# Patient Record
Sex: Male | Born: 1940
Health system: Southern US, Community
[De-identification: ages and names within clinical notes are randomized; demographics above are authoritative.]

## PROBLEM LIST (undated history)

## (undated) DIAGNOSIS — F05 Delirium due to known physiological condition: Secondary | ICD-10-CM

## (undated) DIAGNOSIS — Z955 Presence of coronary angioplasty implant and graft: Secondary | ICD-10-CM

## (undated) DIAGNOSIS — I770 Arteriovenous fistula, acquired: Secondary | ICD-10-CM

## (undated) DIAGNOSIS — I739 Peripheral vascular disease, unspecified: Secondary | ICD-10-CM

## (undated) DIAGNOSIS — N35919 Unspecified urethral stricture, male, unspecified site: Secondary | ICD-10-CM

## (undated) DIAGNOSIS — Z992 Dependence on renal dialysis: Secondary | ICD-10-CM

## (undated) DIAGNOSIS — I350 Nonrheumatic aortic (valve) stenosis: Secondary | ICD-10-CM

## (undated) DIAGNOSIS — D631 Anemia in chronic kidney disease: Secondary | ICD-10-CM

## (undated) DIAGNOSIS — I440 Atrioventricular block, first degree: Secondary | ICD-10-CM

## (undated) DIAGNOSIS — I4510 Unspecified right bundle-branch block: Secondary | ICD-10-CM

## (undated) DIAGNOSIS — K5190 Ulcerative colitis, unspecified, without complications: Secondary | ICD-10-CM

## (undated) DIAGNOSIS — M109 Gout, unspecified: Secondary | ICD-10-CM

## (undated) DIAGNOSIS — R29898 Other symptoms and signs involving the musculoskeletal system: Secondary | ICD-10-CM

## (undated) DIAGNOSIS — Z8679 Personal history of other diseases of the circulatory system: Secondary | ICD-10-CM

## (undated) DIAGNOSIS — Z8673 Personal history of transient ischemic attack (TIA), and cerebral infarction without residual deficits: Secondary | ICD-10-CM

## (undated) DIAGNOSIS — I480 Paroxysmal atrial fibrillation: Secondary | ICD-10-CM

## (undated) DIAGNOSIS — Z932 Ileostomy status: Secondary | ICD-10-CM

## (undated) DIAGNOSIS — Z973 Presence of spectacles and contact lenses: Secondary | ICD-10-CM

## (undated) DIAGNOSIS — F0150 Vascular dementia without behavioral disturbance: Secondary | ICD-10-CM

## (undated) DIAGNOSIS — Z87440 Personal history of urinary (tract) infections: Secondary | ICD-10-CM

## (undated) DIAGNOSIS — I5022 Chronic systolic (congestive) heart failure: Secondary | ICD-10-CM

## (undated) DIAGNOSIS — I2510 Atherosclerotic heart disease of native coronary artery without angina pectoris: Secondary | ICD-10-CM

## (undated) DIAGNOSIS — Z87898 Personal history of other specified conditions: Secondary | ICD-10-CM

## (undated) DIAGNOSIS — Z972 Presence of dental prosthetic device (complete) (partial): Secondary | ICD-10-CM

## (undated) DIAGNOSIS — I252 Old myocardial infarction: Secondary | ICD-10-CM

## (undated) DIAGNOSIS — N189 Chronic kidney disease, unspecified: Secondary | ICD-10-CM

## (undated) DIAGNOSIS — Z8619 Personal history of other infectious and parasitic diseases: Secondary | ICD-10-CM

## (undated) DIAGNOSIS — N186 End stage renal disease: Secondary | ICD-10-CM

## (undated) HISTORY — PX: TOTAL COLECTOMY: SHX852

## (undated) HISTORY — PX: CATARACT EXTRACTION W/ INTRAOCULAR LENS  IMPLANT, BILATERAL: SHX1307

## (undated) HISTORY — PX: CARDIOVASCULAR STRESS TEST: SHX262

## (undated) HISTORY — PX: CHOLECYSTECTOMY: SHX55

## (undated) HISTORY — PX: AV FISTULA PLACEMENT: SHX1204

## (undated) HISTORY — PX: TONSILLECTOMY: SUR1361

## (undated) HISTORY — PX: CARDIAC CATHETERIZATION: SHX172

## (undated) HISTORY — PX: CORONARY ANGIOPLASTY WITH STENT PLACEMENT: SHX49

---

## 1999-08-10 ENCOUNTER — Encounter: Payer: Self-pay | Admitting: Urology

## 1999-08-10 ENCOUNTER — Encounter: Admission: RE | Admit: 1999-08-10 | Discharge: 1999-08-10 | Payer: Self-pay | Admitting: Urology

## 2013-04-17 HISTORY — PX: CAROTID ENDARTERECTOMY: SUR193

## 2013-06-12 ENCOUNTER — Inpatient Hospital Stay (HOSPITAL_COMMUNITY)
Admission: EM | Admit: 2013-06-12 | Discharge: 2013-06-15 | DRG: 689 | Disposition: A | Discharge status: Skilled Nursing Facility | Payer: Medicare Other | Attending: Internal Medicine | Admitting: Internal Medicine

## 2013-06-12 ENCOUNTER — Inpatient Hospital Stay (HOSPITAL_COMMUNITY): Payer: Medicare Other

## 2013-06-12 ENCOUNTER — Encounter (HOSPITAL_COMMUNITY): Payer: Self-pay | Admitting: Emergency Medicine

## 2013-06-12 ENCOUNTER — Emergency Department (HOSPITAL_COMMUNITY): Payer: Medicare Other

## 2013-06-12 DIAGNOSIS — N189 Chronic kidney disease, unspecified: Secondary | ICD-10-CM | POA: Diagnosis present

## 2013-06-12 DIAGNOSIS — I10 Essential (primary) hypertension: Secondary | ICD-10-CM

## 2013-06-12 DIAGNOSIS — Z888 Allergy status to other drugs, medicaments and biological substances status: Secondary | ICD-10-CM

## 2013-06-12 DIAGNOSIS — G9340 Encephalopathy, unspecified: Secondary | ICD-10-CM | POA: Diagnosis present

## 2013-06-12 DIAGNOSIS — D696 Thrombocytopenia, unspecified: Secondary | ICD-10-CM | POA: Diagnosis present

## 2013-06-12 DIAGNOSIS — D631 Anemia in chronic kidney disease: Secondary | ICD-10-CM | POA: Diagnosis present

## 2013-06-12 DIAGNOSIS — M109 Gout, unspecified: Secondary | ICD-10-CM | POA: Diagnosis present

## 2013-06-12 DIAGNOSIS — Z823 Family history of stroke: Secondary | ICD-10-CM

## 2013-06-12 DIAGNOSIS — N390 Urinary tract infection, site not specified: Principal | ICD-10-CM | POA: Diagnosis present

## 2013-06-12 DIAGNOSIS — Z79899 Other long term (current) drug therapy: Secondary | ICD-10-CM

## 2013-06-12 DIAGNOSIS — I1311 Hypertensive heart and chronic kidney disease without heart failure, with stage 5 chronic kidney disease, or end stage renal disease: Secondary | ICD-10-CM | POA: Diagnosis present

## 2013-06-12 DIAGNOSIS — N186 End stage renal disease: Secondary | ICD-10-CM | POA: Diagnosis present

## 2013-06-12 DIAGNOSIS — R5381 Other malaise: Secondary | ICD-10-CM | POA: Diagnosis present

## 2013-06-12 DIAGNOSIS — D649 Anemia, unspecified: Secondary | ICD-10-CM | POA: Diagnosis present

## 2013-06-12 DIAGNOSIS — E860 Dehydration: Secondary | ICD-10-CM | POA: Diagnosis present

## 2013-06-12 DIAGNOSIS — I129 Hypertensive chronic kidney disease with stage 1 through stage 4 chronic kidney disease, or unspecified chronic kidney disease: Secondary | ICD-10-CM | POA: Diagnosis present

## 2013-06-12 DIAGNOSIS — Z8673 Personal history of transient ischemic attack (TIA), and cerebral infarction without residual deficits: Secondary | ICD-10-CM

## 2013-06-12 HISTORY — DX: Gout, unspecified: M10.9

## 2013-06-12 LAB — COMPREHENSIVE METABOLIC PANEL
ALT: 12 U/L (ref 0–53)
Alkaline Phosphatase: 67 U/L (ref 39–117)
BUN: 59 mg/dL — ABNORMAL HIGH (ref 6–23)
CO2: 20 mEq/L (ref 19–32)
Chloride: 97 mEq/L (ref 96–112)
GFR calc Af Amer: 11 mL/min — ABNORMAL LOW (ref >90)
Glucose, Bld: 132 mg/dL — ABNORMAL HIGH (ref 70–99)
Potassium: 4.4 mEq/L (ref 3.5–5.1)
Sodium: 131 mEq/L — ABNORMAL LOW (ref 135–145)
Total Bilirubin: 0.5 mg/dL (ref 0.3–1.2)
Total Protein: 6.4 g/dL (ref 6.0–8.3)

## 2013-06-12 LAB — CBC WITH DIFFERENTIAL/PLATELET
Eosinophils Absolute: 0.1 10*3/uL (ref 0.0–0.7)
Lymphocytes Relative: 6 % — ABNORMAL LOW (ref 12–46)
Lymphs Abs: 0.6 10*3/uL — ABNORMAL LOW (ref 0.7–4.0)
MCHC: 34.9 g/dL (ref 30.0–36.0)
Monocytes Absolute: 0.8 10*3/uL (ref 0.1–1.0)
Monocytes Relative: 9 % (ref 3–12)
Neutro Abs: 7.2 10*3/uL (ref 1.7–7.7)
Neutrophils Relative %: 84 % — ABNORMAL HIGH (ref 43–77)
Platelets: 148 10*3/uL — ABNORMAL LOW (ref 150–400)
RBC: 2.66 MIL/uL — ABNORMAL LOW (ref 4.22–5.81)
WBC: 8.6 10*3/uL (ref 4.0–10.5)

## 2013-06-12 LAB — URINALYSIS, ROUTINE W REFLEX MICROSCOPIC
Bilirubin Urine: NEGATIVE
Ketones, ur: NEGATIVE mg/dL
Nitrite: NEGATIVE
Specific Gravity, Urine: 1.01 (ref 1.005–1.030)
pH: 5.5 (ref 5.0–8.0)

## 2013-06-12 LAB — URINE MICROSCOPIC-ADD ON

## 2013-06-12 MED ORDER — SODIUM CHLORIDE 0.9 % IJ SOLN
3.0000 mL | Freq: Two times a day (BID) | INTRAMUSCULAR | Status: DC
  Administered 2013-06-13 – 2013-06-15 (×3): 3 mL via INTRAVENOUS

## 2013-06-12 MED ORDER — MORPHINE SULFATE 4 MG/ML IJ SOLN
4.0000 mg | Freq: Once | INTRAMUSCULAR | Status: AC
  Administered 2013-06-12: 4 mg via INTRAVENOUS
  Filled 2013-06-12: qty 1

## 2013-06-12 MED ORDER — METOPROLOL TARTRATE 100 MG PO TABS
100.0000 mg | ORAL_TABLET | Freq: Every morning | ORAL | Status: DC
  Administered 2013-06-13 – 2013-06-15 (×3): 100 mg via ORAL
  Filled 2013-06-12 (×3): qty 1

## 2013-06-12 MED ORDER — OXYCODONE HCL 5 MG PO TABS
5.0000 mg | ORAL_TABLET | ORAL | Status: DC | PRN
  Administered 2013-06-12 – 2013-06-13 (×2): 5 mg via ORAL
  Filled 2013-06-12 (×2): qty 1

## 2013-06-12 MED ORDER — FERROUS FUMARATE 325 (106 FE) MG PO TABS
1.0000 | ORAL_TABLET | Freq: Two times a day (BID) | ORAL | Status: DC
  Administered 2013-06-12 – 2013-06-15 (×6): 106 mg via ORAL
  Filled 2013-06-12 (×7): qty 1

## 2013-06-12 MED ORDER — FUROSEMIDE 80 MG PO TABS
80.0000 mg | ORAL_TABLET | Freq: Every day | ORAL | Status: DC
  Administered 2013-06-13 – 2013-06-15 (×3): 80 mg via ORAL
  Filled 2013-06-12 (×3): qty 1

## 2013-06-12 MED ORDER — ACETAMINOPHEN 325 MG PO TABS: 650.0000 mg | ORAL_TABLET | Freq: Four times a day (QID) | ORAL | Status: DC | PRN

## 2013-06-12 MED ORDER — METHYLPREDNISOLONE SODIUM SUCC 40 MG IJ SOLR
40.0000 mg | Freq: Once | INTRAMUSCULAR | Status: AC
  Administered 2013-06-13: 40 mg via INTRAVENOUS
  Filled 2013-06-12: qty 1

## 2013-06-12 MED ORDER — ACETAMINOPHEN 650 MG RE SUPP: 650.0000 mg | Freq: Four times a day (QID) | RECTAL | Status: DC | PRN

## 2013-06-12 MED ORDER — DEXTROSE 5 % IV SOLN
1.0000 g | Freq: Once | INTRAVENOUS | Status: AC
  Administered 2013-06-12: 1 g via INTRAVENOUS
  Filled 2013-06-12: qty 10

## 2013-06-12 MED ORDER — SODIUM CHLORIDE 0.9 % IV BOLUS (SEPSIS)
1000.0000 mL | Freq: Once | INTRAVENOUS | Status: AC
  Administered 2013-06-12: 1000 mL via INTRAVENOUS

## 2013-06-12 MED ORDER — ONDANSETRON HCL 4 MG/2ML IJ SOLN: 4.0000 mg | Freq: Three times a day (TID) | INTRAMUSCULAR | Status: AC | PRN

## 2013-06-12 MED ORDER — FEBUXOSTAT 40 MG PO TABS
40.0000 mg | ORAL_TABLET | Freq: Two times a day (BID) | ORAL | Status: DC
  Administered 2013-06-12 – 2013-06-15 (×6): 40 mg via ORAL
  Filled 2013-06-12 (×7): qty 1

## 2013-06-12 MED ORDER — ONDANSETRON HCL 4 MG/2ML IJ SOLN: 4.0000 mg | Freq: Four times a day (QID) | INTRAMUSCULAR | Status: DC | PRN

## 2013-06-12 MED ORDER — ONDANSETRON HCL 4 MG PO TABS: 4.0000 mg | ORAL_TABLET | Freq: Four times a day (QID) | ORAL | Status: DC | PRN

## 2013-06-12 MED ORDER — PANTOPRAZOLE SODIUM 40 MG PO TBEC
40.0000 mg | DELAYED_RELEASE_TABLET | Freq: Every day | ORAL | Status: DC
  Administered 2013-06-13 – 2013-06-15 (×3): 40 mg via ORAL
  Filled 2013-06-12 (×3): qty 1

## 2013-06-12 MED ORDER — DEXTROSE 5 % IV SOLN
1.0000 g | Freq: Every day | INTRAVENOUS | Status: DC
  Administered 2013-06-13 – 2013-06-14 (×2): 1 g via INTRAVENOUS
  Filled 2013-06-12 (×3): qty 10

## 2013-06-12 MED ORDER — SODIUM CHLORIDE 0.9 % IJ SOLN
3.0000 mL | Freq: Two times a day (BID) | INTRAMUSCULAR | Status: DC
  Administered 2013-06-12 – 2013-06-15 (×4): 3 mL via INTRAVENOUS

## 2013-06-12 NOTE — H&P (Addendum)
[facility] History and Physical  Jacob Fields ION:[id number] DOB: Aug 06, 1940 DOA: 06/12/2013  Referring physician: ER physician. PCP: No primary provider on file. Jacob Fields is visiting his daughter from another town.  Chief Complaint: Pain in the left lower extremity and abdominal pain.  HPI: Jacob Fields is a 72 y.o. male with history of chronic kidney disease, chronic anemia, hypertension, gout and who has had recent stroke in September of this year followed by right-sided carotid endarterectomy this November is visiting his daughter for Christmas. Over the last few days he has experienced increasing pain and swelling of the left foot typical of his gout exacerbation. And also last few days has been having suprapubic pain. Jacob Fields's daughter states that Jacob Fields also looks confused. He also has been having some nonproductive cough. In the ER Jacob Fields was found to be mildly febrile. Chest x-ray was unremarkable. UA shows features of UTI and on exam Jacob Fields does have swelling of the left foot. Jacob Fields has pain on moving the left foot. Denies any trauma or fall recently. Jacob Fields otherwise denies any chest pain shortness of breath nausea vomiting diarrhea or any focal deficits. Jacob Fields on my exam has slurred speech which Jacob Fields's daughter states is chronic from his previous stroke. He is oriented but at times gets confused. CT abdomen and pelvis was showing nothing acute.  Review of Systems: As presented in the history of presenting illness, rest negative.  Past Medical History  Diagnosis Date  . Renal disorder   . Stroke   . Hypertension   . Gout    Past Surgical History  Procedure Laterality Date  . Tonsillectomy    . Cholecystectomy    . Carotid endarterectomy    . Colon surgery     Social History:  reports that he has never smoked. He does not have any smokeless tobacco history on file. He reports that he does not drink alcohol or use illicit drugs. Where does Jacob Fields live  presently lives with his daughter. Can Jacob Fields participate in ADLs? Yes.  Allergies  Allergen Reactions  . Plavix [Clopidogrel]     sores    Family History:  Family History  Problem Relation Age of Onset  . Stroke Mother   . Stroke Brother   . CAD Brother       Prior to Admission medications   Medication Sig Start Date End Date Taking? Authorizing Provider  febuxostat (ULORIC) 40 MG tablet Take 40 mg by mouth 2 (two) times daily.   Yes Historical Provider, MD  ferrous fumarate (HEMOCYTE - 106 MG FE) 325 (106 FE) MG TABS tablet Take 1 tablet by mouth 2 (two) times daily.   Yes Historical Provider, MD  metoprolol (LOPRESSOR) 100 MG tablet Take 100 mg by mouth every morning.   Yes Historical Provider, MD  potassium phosphate, monobasic, (K-PHOS ORIGINAL) 500 MG tablet Take 500-1,000 mg by mouth 2 (two) times daily with a meal. Takes 2 in the morning and 1 at night   Yes Historical Provider, MD    Physical Exam: Filed Vitals:   06/12/13 1703 06/12/13 1931 06/12/13 2024 06/12/13 2301  BP: 117/54  105/45 106/54  Pulse: 71  77 79  Temp: 99.8 F (37.7 C) 99.9 F (37.7 C) 99.5 F (37.5 C) 99 F (37.2 C)  TempSrc: Oral Oral  Oral  Resp: 18  18 18   SpO2: 99%  100% 99%     General:  Well-developed and nourished.  Eyes: Anicteric no pallor.  ENT: No discharge from ears  eyes nose mouth.  Neck: No mass felt.  Cardiovascular: S1-S2 heard.  Respiratory: No rhonchi or crepitations.  Abdomen: Soft nontender bowel sounds present.  Skin: Mild erythema of the left foot.  Musculoskeletal: Swelling of the left foot with pain on moving.  Psychiatric:  Jacob Fields is oriented to his name.  Neurologic: Follows commands and moves all extremities.  Labs on Admission:  Basic Metabolic Panel:  Recent Labs Lab 06/12/13 1900  NA 131*  K 4.4  CL 97  CO2 20  GLUCOSE 132*  BUN 59*  CREATININE 5.23*  CALCIUM 9.3   Liver Function Tests:  Recent Labs Lab 06/12/13 1900  AST 15   ALT 12  ALKPHOS 67  BILITOT 0.5  PROT 6.4  ALBUMIN 3.1*   No results found for this basename: LIPASE, AMYLASE,  in the last 168 hours No results found for this basename: AMMONIA,  in the last 168 hours CBC:  Recent Labs Lab 06/12/13 1900  WBC 8.6  NEUTROABS 7.2  HGB 8.7*  HCT 24.9*  MCV 93.6  PLT 148*   Cardiac Enzymes: No results found for this basename: CKTOTAL, CKMB, CKMBINDEX, TROPONINI,  in the last 168 hours  BNP (last 3 results) No results found for this basename: PROBNP,  in the last 8760 hours CBG: No results found for this basename: GLUCAP,  in the last 168 hours  Radiological Exams on Admission: Ct Abdomen Pelvis Wo Contrast  06/12/2013   CLINICAL DATA:  Left-sided back pain. Lower back and bilateral groin pain.  EXAM: CT ABDOMEN AND PELVIS WITHOUT CONTRAST  TECHNIQUE: Multidetector CT imaging of the abdomen and pelvis was performed following the standard protocol without intravenous contrast.  COMPARISON:  None.  FINDINGS: There is right renal atrophy and multiple right renal stones. One stone lies in the renal pelvis. There is no hydronephrosis. The right ureter is normal in course and in caliber with no stones. There is an 8 mm low-density mass protruding laterally from the midpole the right kidney most likely a cyst. Smaller presumed cysts are noted arising from the posterior upper pole. Tiny nonobstructing stone is suggested in the upper pole of the left kidney. There are no other left renal stones and no left renal masses. There is no left hydronephrosis. Normal left ureter. The bladder is unremarkable.  Prostate is mildly enlarged.  Minor reticular subsegmental atelectasis at the lung bases. The heart is normal in size.  Liver is unremarkable. Spleen is borderline enlarged measuring 12.3 cm in longest dimension. No splenic mass or focal lesion. Gallbladder not visualized. Mild prominence of the common bile duct is noted with normal distal tapering. There is  pancreatic atrophy but no pancreatic mass or inflammation. There are no adrenal masses.  No pathologically enlarged lymph nodes are seen. There are no abnormal fluid collections.  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 There is no evidence of obstruction. No bowel wall thickening or mesenteric inflammation is seen.  There are degenerative changes of the visualized spine. No osteoblastic or osteolytic lesions.  IMPRESSION: 1. No acute findings. 2. Right renal atrophy with multiple right renal stones, but no ureteral stone or hydronephrosis. Tiny left intrarenal nonobstructing stone. 3. Borderline splenomegaly. 4. Changes from previous colon surgery. 5. Other chronic findings as described.   Electronically Signed   By: Amie Portland M.D.   On: 06/12/2013 20:08   Dg Chest Port 1 View  06/12/2013   CLINICAL DATA:  Weakness, fever  EXAM: PORTABLE CHEST -  1 VIEW  COMPARISON:  None.  FINDINGS: Mild hyperinflation. Normal heart size. Negative for CHF or pneumonia. Healed numerous left rib fractures with deformity. Vascular calcifications noted compatible with atherosclerosis. No effusion or pneumothorax. Left base atelectasis versus scarring.  IMPRESSION: Chronic changes as above.  No acute process.   Electronically Signed   By: Ruel Favors M.D.   On: 06/12/2013 23:21     Assessment/Plan Principal Problem:   Acute encephalopathy Active Problems:   UTI (urinary tract infection)   Gout attack   CKD (chronic kidney disease)   HTN (hypertension)   Anemia   Thrombocytopenia   1. Acute encephalopathy probably from UTI - Jacob Fields has been placed on ceftriaxone. Check urine cultures and blood cultures. Since Jacob Fields also had some nonproductive cough check influenza PCR. CT head is pending. 2. Left foot swelling possibly from gouty attack - I have placed Jacob Fields on one dose of IV Solu-Medrol. Check uric acid levels. Check x-rays of the left foot to rule out trauma. Continue  Uloric. 3. Chronic kidney disease - Jacob Fields states his creatinine is around 8-9. He is on Lasix 80 mg daily. Closely follow intake output and metabolic panel. 4. Chronic anemia - Jacob Fields does not remember his baseline hemoglobin. He is on iron pills. Closely follow CBC. 5. Hypertension - continue present medications. 6. Thrombocytopenia - follow CBC. 7. Recent stroke status post right-sided carotid endarterectomy. 8. History of seizures - has been off Tegretol for many years ago.    Code Status: Full code.  Family Communication: Family at the bedside.  Disposition Plan: Admit to inpatient.    Emary Zalar N. [facility] Pager [phone].  If 7PM-7AM, please contact night-coverage [email or web address] Password [facility] 06/12/2013, 11:26 PM

## 2013-06-12 NOTE — ED Notes (Signed)
Per EMS, Pt. C/o pani in both legs due to gout and and bilateral pain in groin area and lower back. Pt. Can usually ambulate on his own, but now needs assistance walking. Pt Pain worsens when sitting up. A&Ox4. Pt has hx of kidney issues and a stroke in the past year. Pt. Has fistula in L arm.

## 2013-06-12 NOTE — ED Provider Notes (Signed)
CSN: [id number]     Arrival date & time 06/12/13  1650 History   First MD Initiated Contact with Patient 06/12/13 1710     Chief Complaint  Patient presents with  . Leg Pain  . Back Pain   (Consider location/radiation/quality/duration/timing/severity/associated sxs/prior Treatment) Patient is a 72 y.o. male presenting with lower extremity pain, hip pain, back pain, and abdominal pain. The history is provided by the patient. No language interpreter was used.  Foot Pain This is a recurrent problem. The current episode started 2 days ago. The problem occurs constantly. The problem has been gradually worsening. Associated symptoms include abdominal pain. Pertinent negatives include no chest pain, no headaches and no shortness of breath. The symptoms are aggravated by walking and standing. The symptoms are relieved by rest and lying down. Treatments tried: oxycodone. The treatment provided no relief.  Hip Pain This is a recurrent problem. The current episode started 2 days ago. The problem occurs constantly. The problem has been gradually worsening. Associated symptoms include abdominal pain. Pertinent negatives include no chest pain, no headaches and no shortness of breath. The symptoms are aggravated by standing. The symptoms are relieved by lying down.  Back Pain Location:  Lumbar spine Quality:  Aching Radiates to:  Does not radiate Pain severity:  Moderate Onset quality:  Gradual Duration:  2 days Timing:  Constant Progression:  Worsening Chronicity:  Recurrent Relieved by:  Lying down Worsened by:  Standing Ineffective treatments: oxycodone. Associated symptoms: abdominal pain   Associated symptoms: no chest pain, no dysuria, no fever, no headaches, no numbness and no weakness   Abdominal Pain Pain location:  Suprapubic Pain quality: aching and sharp   Pain radiates to:  Does not radiate Pain severity:  Moderate Duration:  2 days Timing:  Intermittent Progression:  Waxing and  waning Chronicity:  New Context: not recent illness   Relieved by:  Nothing Ineffective treatments:  None tried Associated symptoms: fatigue   Associated symptoms: no anorexia, no chest pain, no constipation, no cough, no diarrhea, no dysuria, no fever, no nausea, no shortness of breath and no vomiting     Past Medical History  Diagnosis Date  . Renal disorder   . Stroke   . Hypertension   . Gout    Past Surgical History  Procedure Laterality Date  . Tonsillectomy    . Cholecystectomy    . Carotid endarterectomy    . Colon surgery     Family History  Problem Relation Age of Onset  . Stroke Mother   . Stroke Brother   . CAD Brother    History  Substance Use Topics  . Smoking status: Never Smoker   . Smokeless tobacco: Not on file  . Alcohol Use: No    Review of Systems  Constitutional: Positive for fatigue. Negative for fever, activity change and appetite change.  HENT: Negative for congestion, facial swelling, rhinorrhea and trouble swallowing.   Eyes: Negative for photophobia and pain.  Respiratory: Negative for cough, chest tightness and shortness of breath.   Cardiovascular: Negative for chest pain and leg swelling.  Gastrointestinal: Positive for abdominal pain. Negative for nausea, vomiting, diarrhea, constipation and anorexia.  Endocrine: Negative for polydipsia and polyuria.  Genitourinary: Negative for dysuria, urgency, decreased urine volume and difficulty urinating.  Musculoskeletal: Positive for back pain. Negative for gait problem.  Skin: Negative for color change, rash and wound.  Allergic/Immunologic: Negative for immunocompromised state.  Neurological: Negative for dizziness, facial asymmetry, speech difficulty, weakness, numbness and headaches.  Psychiatric/Behavioral: Negative for confusion, decreased concentration and agitation.    Allergies  Plavix  Home Medications   No current outpatient prescriptions on file. BP 112/67  Pulse 79   Temp(Src) 98.1 F (36.7 C) (Oral)  Resp 18  Ht 5\' 6"  (1.676 m)  Wt 141 lb 1.5 oz (64 kg)  BMI 22.78 kg/m2  SpO2 96% Physical Exam  Constitutional: He is oriented to person, place, and time. He appears well-developed and well-nourished. No distress.  HENT:  Head: Normocephalic and atraumatic.  Mouth/Throat: No oropharyngeal exudate.  Eyes: Pupils are equal, round, and reactive to light.  Neck: Normal range of motion. Neck supple.  Cardiovascular: Normal rate, regular rhythm and normal heart sounds.  Exam reveals no gallop and no friction rub.   No murmur heard. Pulmonary/Chest: Effort normal and breath sounds normal. No respiratory distress. He has no wheezes. He has no rales.  Abdominal: Soft. Bowel sounds are normal. He exhibits no distension and no mass. There is no tenderness. There is no rebound and no guarding.  Musculoskeletal: Normal range of motion. He exhibits no edema.       Right hip: He exhibits tenderness.       Left hip: He exhibits tenderness.       Left foot: He exhibits tenderness, bony tenderness and swelling.       Feet:  Neurological: He is alert and oriented to person, place, and time.  Skin: Skin is warm and dry.  Psychiatric: He has a normal mood and affect.    ED Course  Procedures (including critical care time) Labs Review Labs Reviewed  CBC WITH DIFFERENTIAL - Abnormal; Notable for the following:    RBC 2.66 (*)    Hemoglobin 8.7 (*)    HCT 24.9 (*)    Platelets 148 (*)    Neutrophils Relative % 84 (*)    Lymphocytes Relative 6 (*)    Lymphs Abs 0.6 (*)    All other components within normal limits  COMPREHENSIVE METABOLIC PANEL - Abnormal; Notable for the following:    Sodium 131 (*)    Glucose, Bld 132 (*)    BUN 59 (*)    Creatinine, Ser 5.23 (*)    Albumin 3.1 (*)    GFR calc non Af Amer 10 (*)    GFR calc Af Amer 11 (*)    All other components within normal limits  URINALYSIS, ROUTINE W REFLEX MICROSCOPIC - Abnormal; Notable for the  following:    APPearance CLOUDY (*)    Hgb urine dipstick MODERATE (*)    Protein, ur 100 (*)    Leukocytes, UA LARGE (*)    All other components within normal limits  URINE MICROSCOPIC-ADD ON - Abnormal; Notable for the following:    Squamous Epithelial / LPF FEW (*)    Bacteria, UA MANY (*)    All other components within normal limits  URIC ACID - Abnormal; Notable for the following:    Uric Acid, Serum 9.0 (*)    All other components within normal limits  COMPREHENSIVE METABOLIC PANEL - Abnormal; Notable for the following:    Sodium 134 (*)    Glucose, Bld 178 (*)    BUN 57 (*)    Creatinine, Ser 4.86 (*)    Albumin 2.9 (*)    GFR calc non Af Amer 11 (*)    GFR calc Af Amer 12 (*)    All other components within normal limits  CBC WITH DIFFERENTIAL - Abnormal; Notable for the  following:    RBC 2.71 (*)    Hemoglobin 8.7 (*)    HCT 25.2 (*)    Platelets 122 (*)    Neutrophils Relative % 93 (*)    Lymphocytes Relative 4 (*)    Lymphs Abs 0.3 (*)    Monocytes Relative 2 (*)    All other components within normal limits  URINE CULTURE  CULTURE, BLOOD (ROUTINE X 2)  CULTURE, BLOOD (ROUTINE X 2)  INFLUENZA PANEL BY PCR   Imaging Review Ct Abdomen Pelvis Wo Contrast  06/12/2013   CLINICAL DATA:  Left-sided back pain. Lower back and bilateral groin pain.  EXAM: CT ABDOMEN AND PELVIS WITHOUT CONTRAST  TECHNIQUE: Multidetector CT imaging of the abdomen and pelvis was performed following the standard protocol without intravenous contrast.  COMPARISON:  None.  FINDINGS: There is right renal atrophy and multiple right renal stones. One stone lies in the renal pelvis. There is no hydronephrosis. The right ureter is normal in course and in caliber with no stones. There is an 8 mm low-density mass protruding laterally from the midpole the right kidney most likely a cyst. Smaller presumed cysts are noted arising from the posterior upper pole. Tiny nonobstructing stone is suggested in the  upper pole of the left kidney. There are no other left renal stones and no left renal masses. There is no left hydronephrosis. Normal left ureter. The bladder is unremarkable.  Prostate is mildly enlarged.  Minor reticular subsegmental atelectasis at the lung bases. The heart is normal in size.  Liver is unremarkable. Spleen is borderline enlarged measuring 12.3 cm in longest dimension. No splenic mass or focal lesion. Gallbladder not visualized. Mild prominence of the common bile duct is noted with normal distal tapering. There is pancreatic atrophy but no pancreatic mass or inflammation. There are no adrenal masses.  No pathologically enlarged lymph nodes are seen. There are no abnormal fluid collections.  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 There is no evidence of obstruction. No bowel wall thickening or mesenteric inflammation is seen.  There are degenerative changes of the visualized spine. No osteoblastic or osteolytic lesions.  IMPRESSION: 1. No acute findings. 2. Right renal atrophy with multiple right renal stones, but no ureteral stone or hydronephrosis. Tiny left intrarenal nonobstructing stone. 3. Borderline splenomegaly. 4. Changes from previous colon surgery. 5. Other chronic findings as described.   Electronically Signed   By: Amie Portland M.D.   On: 06/12/2013 20:08   Ct Head Wo Contrast  06/13/2013   CLINICAL DATA:  Altered mental status.  EXAM: CT HEAD WITHOUT CONTRAST  TECHNIQUE: Contiguous axial images were obtained from the base of the skull through the vertex without intravenous contrast.  COMPARISON:  None.  FINDINGS: There is no evidence of acute infarction, mass lesion, or intra- or extra-axial hemorrhage on CT.  Prominence of the ventricles and sulci reflects mild to moderate cortical volume loss. Cerebellar atrophy is noted. Scattered periventricular and subcortical white matter change likely reflects small vessel ischemic microangiopathy. Small  chronic lacunar infarcts are seen in the caudate bilaterally, and at the left thalamus. A small chronic infarct is noted at the high right parietal lobe, with mild associated encephalomalacia.  The brainstem and fourth ventricle are within normal limits. No mass effect or midline shift is seen.  There is no evidence of fracture; visualized osseous structures are unremarkable in appearance. The orbits are within normal limits. The paranasal sinuses and mastoid air cells are well-aerated.  No significant soft tissue abnormalities are seen.  IMPRESSION: 1. No acute intracranial pathology seen on CT. 2. Mild to moderate cortical volume loss and scattered small vessel ischemic microangiopathy. 3. Small chronic infarct at the high right parietal lobe, with mild associated encephalomalacia; small chronic lacunar infarcts within the basal ganglia bilaterally, and at the left thalamus.   Electronically Signed   By: Roanna Raider M.D.   On: 06/13/2013 02:22   Dg Chest Port 1 View  06/12/2013   CLINICAL DATA:  Weakness, fever  EXAM: PORTABLE CHEST - 1 VIEW  COMPARISON:  None.  FINDINGS: Mild hyperinflation. Normal heart size. Negative for CHF or pneumonia. Healed numerous left rib fractures with deformity. Vascular calcifications noted compatible with atherosclerosis. No effusion or pneumothorax. Left base atelectasis versus scarring.  IMPRESSION: Chronic changes as above.  No acute process.   Electronically Signed   By: Ruel Favors M.D.   On: 06/12/2013 23:21   Dg Foot Complete Left  06/13/2013   CLINICAL DATA:  Left mid foot and heel pain with history of gout  EXAM: LEFT FOOT - COMPLETE 3+ VIEW  COMPARISON:  None.  FINDINGS: The bones of the left foot appear adequately mineralized. There is no evidence of an acute fracture nor dislocation. There is no periosteal reaction or lytic or blastic lesion. There are vascular calcifications over the midfoot. No erosions are demonstrated. The bones of the hindfoot appear  intact. There is a moderate-sized plantar calcaneal spur.  IMPRESSION: There is no acute bony abnormality of the left foot. There are vascular calcifications.   Electronically Signed   By: David  [place]   On: 06/13/2013 07:43    EKG Interpretation    Date/Time:    Ventricular Rate:    PR Interval:    QRS Duration:   QT Interval:    QTC Calculation:   R Axis:     Text Interpretation:              MDM   1. UTI (urinary tract infection)   2. Gout flare   3. Acute encephalopathy   4. CKD (chronic kidney disease)   5. Gout attack    Pt is a 72 y.o. male with Pmhx as above who presents with BL hip pain, suprapubic pain, low back pain, L foot pain which he believe is due to a gout flare.  Family state he has been unable to stand or walk past 2 days due to pain, has had low grade temp.  Denies cough, CP, SOB.  He strains to urinate, and has chronically loose stool.  On PE, VSS, pt uncomfortable, but non-toxic.  He has suprapubic ttp, TTP BL hips and severe ttp of L foot w/ sweeting c/w gout.  W/U shows Hb 8.7 (unknown baseline), Cr 5.23 (baselin 8-9), UA concerning for infxn with large leukocytes, hb, TNTC WBCs.  CT stone study done w/o no acute findings. Pt given IVF< IV rocephin.  Given concern for UTI and pt's inability to ambulate, perform ADLs at home, I feel he requires inpt admission.         Shanna Cisco, MD 06/13/13 1251

## 2013-06-13 ENCOUNTER — Encounter (HOSPITAL_COMMUNITY): Payer: Self-pay | Admitting: *Deleted

## 2013-06-13 ENCOUNTER — Inpatient Hospital Stay (HOSPITAL_COMMUNITY): Payer: Medicare Other

## 2013-06-13 DIAGNOSIS — I10 Essential (primary) hypertension: Secondary | ICD-10-CM

## 2013-06-13 LAB — COMPREHENSIVE METABOLIC PANEL
ALT: 12 U/L (ref 0–53)
Albumin: 2.9 g/dL — ABNORMAL LOW (ref 3.5–5.2)
Alkaline Phosphatase: 63 U/L (ref 39–117)
BUN: 57 mg/dL — ABNORMAL HIGH (ref 6–23)
CO2: 20 mEq/L (ref 19–32)
Chloride: 101 mEq/L (ref 96–112)
Creatinine, Ser: 4.86 mg/dL — ABNORMAL HIGH (ref 0.50–1.35)
GFR calc Af Amer: 12 mL/min — ABNORMAL LOW (ref >90)
Glucose, Bld: 178 mg/dL — ABNORMAL HIGH (ref 70–99)
Potassium: 4.2 mEq/L (ref 3.5–5.1)
Total Bilirubin: 0.3 mg/dL (ref 0.3–1.2)
Total Protein: 6.5 g/dL (ref 6.0–8.3)

## 2013-06-13 LAB — CBC WITH DIFFERENTIAL/PLATELET
Basophils Absolute: 0 10*3/uL (ref 0.0–0.1)
Eosinophils Relative: 0 % (ref 0–5)
Lymphocytes Relative: 4 % — ABNORMAL LOW (ref 12–46)
Lymphs Abs: 0.3 10*3/uL — ABNORMAL LOW (ref 0.7–4.0)
MCV: 93 fL (ref 78.0–100.0)
Monocytes Relative: 2 % — ABNORMAL LOW (ref 3–12)
Neutro Abs: 6.2 10*3/uL (ref 1.7–7.7)
Neutrophils Relative %: 93 % — ABNORMAL HIGH (ref 43–77)
Platelets: 122 10*3/uL — ABNORMAL LOW (ref 150–400)
RBC: 2.71 MIL/uL — ABNORMAL LOW (ref 4.22–5.81)
RDW: 14.7 % (ref 11.5–15.5)
WBC: 6.7 10*3/uL (ref 4.0–10.5)

## 2013-06-13 LAB — URIC ACID: Uric Acid, Serum: 9 mg/dL — ABNORMAL HIGH (ref 4.0–7.8)

## 2013-06-13 LAB — INFLUENZA PANEL BY PCR (TYPE A & B)
H1N1 flu by pcr: NOT DETECTED
Influenza B By PCR: NEGATIVE

## 2013-06-13 MED ORDER — PREDNISONE 50 MG PO TABS
50.0000 mg | ORAL_TABLET | Freq: Two times a day (BID) | ORAL | Status: DC
  Administered 2013-06-13 – 2013-06-15 (×4): 50 mg via ORAL
  Filled 2013-06-13 (×6): qty 1

## 2013-06-13 NOTE — Progress Notes (Signed)
[facility] PROGRESS NOTE  Jacob Fields UJW:[id number] DOB: [birth year] DOA: 06/12/2013 PCP: No primary provider on file.  Assessment/Plan: Principal Problem:   Acute encephalopathy: Patient seems quite alert and oriented, as commented by my partner yesterday, no evidence of encephalopathy. Patient may have had some mild confusion from dehydration but that has since resolved. Active Problems:   UTI (urinary tract infection): On IV Rocephin, cultures pending. Reports a fever likely secondary to UTI. Flu PCR is negative    Gout attack: Received one dose of IV steroids yesterday and today started on by mouth steroids. Patient is on Lasix which can sometimes precipitate gout attack. We'll start to ambulate   CKD (chronic kidney disease): Looks to be at baseline   HTN (hypertension): Blood pressure stable   Anemia: Remaining stable   Thrombocytopenia: Monitoring platelets. Chronic.  Code Status: Full code Family Communication: Spoke with patient's daughter at the bedside.  Disposition Plan: Home, likely tomorrow once we get out of bed   Consultants:  None  Procedures:  None  Antibiotics:  IV Rocephin day 2  HPI/Subjective: Patient doing okay. No acute distress. Breathing comfortably. Pain in his foot is minimal  Objective: Filed Vitals:   06/13/13 1300  BP: 99/63  Pulse: 58  Temp: 97.8 F (36.6 C)  Resp: 18    Intake/Output Summary (Last 24 hours) at 06/13/13 1636 Last data filed at 06/13/13 1500  Gross per 24 hour  Intake    363 ml  Output    325 ml  Net     38 ml   Filed Weights   06/12/13 2337 06/13/13 0510  Weight: 64 kg (141 lb 1.5 oz) 64 kg (141 lb 1.5 oz)    Exam:   General:  Alert and oriented x3, no acute distress  Cardiovascular: Regular rate and rhythm, S1-S2  Respiratory: Clear to auscultation bilaterally  Abdomen: Soft, nontender, nondistended, positive bowel sounds  Musculoskeletal: No clubbing or cyanosis, trace edema. Left toes  are nontender, slightly swollen.   Data Reviewed: Basic Metabolic Panel:  Recent Labs Lab 06/12/13 1900 06/13/13 0523  NA 131* 134*  K 4.4 4.2  CL 97 101  CO2 20 20  GLUCOSE 132* 178*  BUN 59* 57*  CREATININE 5.23* 4.86*  CALCIUM 9.3 9.5   Liver Function Tests:  Recent Labs Lab 06/12/13 1900 06/13/13 0523  AST 15 14  ALT 12 12  ALKPHOS 67 63  BILITOT 0.5 0.3  PROT 6.4 6.5  ALBUMIN 3.1* 2.9*   No results found for this basename: LIPASE, AMYLASE,  in the last 168 hours No results found for this basename: AMMONIA,  in the last 168 hours CBC:  Recent Labs Lab 06/12/13 1900 06/13/13 0523  WBC 8.6 6.7  NEUTROABS 7.2 6.2  HGB 8.7* 8.7*  HCT 24.9* 25.2*  MCV 93.6 93.0  PLT 148* 122*   Cardiac Enzymes: No results found for this basename: CKTOTAL, CKMB, CKMBINDEX, TROPONINI,  in the last 168 hours BNP (last 3 results) No results found for this basename: PROBNP,  in the last 8760 hours CBG: No results found for this basename: GLUCAP,  in the last 168 hours  No results found for this or any previous visit (from the past 240 hour(s)).   Studies: Ct Abdomen Pelvis Wo Contrast  06/12/2013   CLINICAL DATA:  Left-sided back pain. Lower back and bilateral groin pain.  EXAM: CT ABDOMEN AND PELVIS WITHOUT CONTRAST  TECHNIQUE: Multidetector CT imaging of the abdomen and pelvis was performed following  the standard protocol without intravenous contrast.  COMPARISON:  None.  FINDINGS: There is right renal atrophy and multiple right renal stones. One stone lies in the renal pelvis. There is no hydronephrosis. The right ureter is normal in course and in caliber with no stones. There is an 8 mm low-density mass protruding laterally from the midpole the right kidney most likely a cyst. Smaller presumed cysts are noted arising from the posterior upper pole. Tiny nonobstructing stone is suggested in the upper pole of the left kidney. There are no other left renal stones and no left renal  masses. There is no left hydronephrosis. Normal left ureter. The bladder is unremarkable.  Prostate is mildly enlarged.  Minor reticular subsegmental atelectasis at the lung bases. The heart is normal in size.  Liver is unremarkable. Spleen is borderline enlarged measuring 12.3 cm in longest dimension. No splenic mass or focal lesion. Gallbladder not visualized. Mild prominence of the common bile duct is noted with normal distal tapering. There is pancreatic atrophy but no pancreatic mass or inflammation. There are no adrenal masses.  No pathologically enlarged lymph nodes are seen. There are no abnormal fluid collections.  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 There is no evidence of obstruction. No bowel wall thickening or mesenteric inflammation is seen.  There are degenerative changes of the visualized spine. No osteoblastic or osteolytic lesions.  IMPRESSION: 1. No acute findings. 2. Right renal atrophy with multiple right renal stones, but no ureteral stone or hydronephrosis. Tiny left intrarenal nonobstructing stone. 3. Borderline splenomegaly. 4. Changes from previous colon surgery. 5. Other chronic findings as described.   Electronically Signed   By: Amie Portland M.D.   On: 06/12/2013 20:08   Ct Head Wo Contrast  06/13/2013   CLINICAL DATA:  Altered mental status.  EXAM: CT HEAD WITHOUT CONTRAST  TECHNIQUE: Contiguous axial images were obtained from the base of the skull through the vertex without intravenous contrast.  COMPARISON:  None.  FINDINGS: There is no evidence of acute infarction, mass lesion, or intra- or extra-axial hemorrhage on CT.  Prominence of the ventricles and sulci reflects mild to moderate cortical volume loss. Cerebellar atrophy is noted. Scattered periventricular and subcortical white matter change likely reflects small vessel ischemic microangiopathy. Small chronic lacunar infarcts are seen in the caudate bilaterally, and at the left thalamus.  A small chronic infarct is noted at the high right parietal lobe, with mild associated encephalomalacia.  The brainstem and fourth ventricle are within normal limits. No mass effect or midline shift is seen.  There is no evidence of fracture; visualized osseous structures are unremarkable in appearance. The orbits are within normal limits. The paranasal sinuses and mastoid air cells are well-aerated. No significant soft tissue abnormalities are seen.  IMPRESSION: 1. No acute intracranial pathology seen on CT. 2. Mild to moderate cortical volume loss and scattered small vessel ischemic microangiopathy. 3. Small chronic infarct at the high right parietal lobe, with mild associated encephalomalacia; small chronic lacunar infarcts within the basal ganglia bilaterally, and at the left thalamus.   Electronically Signed   By: Roanna Raider M.D.   On: 06/13/2013 02:22   Dg Chest Port 1 View  06/12/2013   CLINICAL DATA:  Weakness, fever  EXAM: PORTABLE CHEST - 1 VIEW  COMPARISON:  None.  FINDINGS: Mild hyperinflation. Normal heart size. Negative for CHF or pneumonia. Healed numerous left rib fractures with deformity. Vascular calcifications noted compatible with atherosclerosis. No effusion or  pneumothorax. Left base atelectasis versus scarring.  IMPRESSION: Chronic changes as above.  No acute process.   Electronically Signed   By: Ruel Favors M.D.   On: 06/12/2013 23:21   Dg Foot Complete Left  06/13/2013   CLINICAL DATA:  Left mid foot and heel pain with history of gout  EXAM: LEFT FOOT - COMPLETE 3+ VIEW  COMPARISON:  None.  FINDINGS: The bones of the left foot appear adequately mineralized. There is no evidence of an acute fracture nor dislocation. There is no periosteal reaction or lytic or blastic lesion. There are vascular calcifications over the midfoot. No erosions are demonstrated. The bones of the hindfoot appear intact. There is a moderate-sized plantar calcaneal spur.  IMPRESSION: There is no acute  bony abnormality of the left foot. There are vascular calcifications.   Electronically Signed   By: David  [place]   On: 06/13/2013 07:43    Scheduled Meds: . cefTRIAXone (ROCEPHIN)  IV  1 g Intravenous QHS  . febuxostat  40 mg Oral BID  . ferrous fumarate  1 tablet Oral BID  . furosemide  80 mg Oral Daily  . metoprolol  100 mg Oral q morning - 10a  . pantoprazole  40 mg Oral Daily  . predniSONE  50 mg Oral BID WC  . sodium chloride  3 mL Intravenous Q12H  . sodium chloride  3 mL Intravenous Q12H   Continuous Infusions:   Principal Problem:   Acute encephalopathy Active Problems:   UTI (urinary tract infection)   Gout attack   CKD (chronic kidney disease)   HTN (hypertension)   Anemia   Thrombocytopenia    Time spent: 25 minutes    Hollice Espy  [facility] Pager [phone]. If 7PM-7AM, please contact night-coverage at [email or web address], password [facility] 06/13/2013, 4:36 PM  LOS: 1 day

## 2013-06-14 DIAGNOSIS — R5381 Other malaise: Secondary | ICD-10-CM | POA: Diagnosis present

## 2013-06-14 LAB — BASIC METABOLIC PANEL
CO2: 21 mEq/L (ref 19–32)
Chloride: 100 mEq/L (ref 96–112)
Glucose, Bld: 158 mg/dL — ABNORMAL HIGH (ref 70–99)
Potassium: 4.6 mEq/L (ref 3.5–5.1)
Sodium: 134 mEq/L — ABNORMAL LOW (ref 135–145)

## 2013-06-14 NOTE — Care Management Note (Addendum)
    Page 1 of 1   06/15/2013     2:00:31 PM   CARE MANAGEMENT NOTE 06/15/2013  Patient:  Jacob Fields, Jacob Fields   Account Number:  [id number]  Date Initiated:  06/14/2013  Documentation initiated by:  Lanier Clam  Subjective/Objective Assessment:   72 Y/O M ADMITTED W/UTI.     Action/Plan:   FROM HOME.   Anticipated DC Date:  06/15/2013   Anticipated DC Plan:  SKILLED NURSING FACILITY      DC Planning Services  CM consult      Choice offered to / List presented to:             Status of service:  Completed, signed off Medicare Important Message given?   (If response is "NO", the following Medicare IM given date fields will be blank) Date Medicare IM given:   Date Additional Medicare IM given:    Discharge Disposition:  SKILLED NURSING FACILITY  Per UR Regulation:  Reviewed for med. necessity/level of care/duration of stay  If discussed at Long Length of Stay Meetings, dates discussed:    Comments:  06/15/13 Janea Schwenn RN,BSN NCM [phone] D/C SNF.  06/14/13 Marco Raper RN,BSN NCM [phone] PT-SNF.

## 2013-06-14 NOTE — Evaluation (Signed)
Physical Therapy Evaluation Patient Details Name: Jacob Fields MRN: [id number] DOB: March 13, 1941 Today's Date: 06/14/2013 Time: 7829-5621 PT Time Calculation (min): 26 min  PT Assessment / Plan / Recommendation History of Present Illness  72 y.o. male with history of chronic kidney disease, chronic anemia, hypertension, gout and who has had recent stroke in September of this year followed by right-sided carotid endarterectomy this November is visiting his daughter for Christmas. Over the last few days he has experienced increasing pain and swelling of the left foot typical of his gout exacerbation. And also last few days has been having suprapubic pain. Patient's daughter states that patient also looks confused. He also has been having some nonproductive cough. In the ER patient was found to be mildly febrile. Chest x-ray was unremarkable. UA shows features of UTI and on exam patient does have swelling of the left foot. Patient has pain on moving the left foot. Denies any trauma or fall recently. Patient otherwise denies any chest pain shortness of breath nausea vomiting diarrhea or any focal deficits. Patient on my exam has slurred speech which patient's daughter states is chronic from his previous stroke. He is oriented but at times gets confused. CT abdomen and pelvis was showing nothing acute.  Clinical Impression  Pt admitted with UTI.  Pt currently with functional limitations due to the deficits listed below (see PT Problem List).  Pt will benefit from skilled PT to increase their independence and safety with mobility to allow discharge to the venue listed below.  Pt's daughter reports pt is visiting for holidays and lives alone.  She feels pt is not able to care for himself as his cognition has been declining however aware that pt is capable of making his own decisions.  She also reports hx of falls however pt denies.  Recommend SNF at this time.       PT Assessment  Patient needs continued PT  services    Follow Up Recommendations  SNF    Does the patient have the potential to tolerate intense rehabilitation      Barriers to Discharge        Equipment Recommendations  None recommended by PT    Recommendations for Other Services     Frequency Min 3X/week    Precautions / Restrictions Precautions Precautions: Fall   Pertinent Vitals/Pain C/o pain in L foot not rated however states much better since yesterday, utilized RW for pain control      Mobility  Bed Mobility Bed Mobility: Supine to Sit Supine to Sit: 5: Supervision;HOB elevated Details for Bed Mobility Assistance: increased time Transfers Transfers: Sit to Stand;Stand to Sit Sit to Stand: 4: Min assist;With upper extremity assist;From bed Stand to Sit: 4: Min guard;With upper extremity assist;To chair/3-in-1 Details for Transfer Assistance: verbal cues for safe technique Ambulation/Gait Ambulation/Gait Assistance: 4: Min assist Ambulation Distance (Feet): 240 Feet Assistive device: Rolling walker Ambulation/Gait Assistance Details: min assist for LOB upon initiating gait, pt prefers step to pattern for pain control due to gout flare in L foot Gait Pattern: Step-to pattern Gait velocity: decr    Exercises     PT Diagnosis:    PT Problem List: Decreased strength;Decreased knowledge of precautions;Decreased balance;Decreased mobility;Decreased knowledge of use of DME;Pain PT Treatment Interventions: DME instruction;Gait training;Functional mobility training;Therapeutic activities;Therapeutic exercise;Patient/family education;Balance training     PT Goals(Current goals can be found in the care plan section) Acute Rehab PT Goals PT Goal Formulation: With patient/family Time For Goal Achievement: 06/21/13 Potential to  Achieve Goals: Good  Visit Information  Last PT Received On: 06/14/13 Assistance Needed: +1 History of Present Illness: 72 y.o. male with history of chronic kidney disease, chronic  anemia, hypertension, gout and who has had recent stroke in September of this year followed by right-sided carotid endarterectomy this November is visiting his daughter for Christmas. Over the last few days he has experienced increasing pain and swelling of the left foot typical of his gout exacerbation. And also last few days has been having suprapubic pain. Patient's daughter states that patient also looks confused. He also has been having some nonproductive cough. In the ER patient was found to be mildly febrile. Chest x-ray was unremarkable. UA shows features of UTI and on exam patient does have swelling of the left foot. Patient has pain on moving the left foot. Denies any trauma or fall recently. Patient otherwise denies any chest pain shortness of breath nausea vomiting diarrhea or any focal deficits. Patient on my exam has slurred speech which patient's daughter states is chronic from his previous stroke. He is oriented but at times gets confused. CT abdomen and pelvis was showing nothing acute.       Prior Functioning  Home Living Family/patient expects to be discharged to:: Private residence Living Arrangements: Alone Home Equipment: Dan Humphreys - 2 wheels Additional Comments: pt in town visiting family Prior Function Level of Independence: Independent Communication Communication: No difficulties    Cognition  Cognition Arousal/Alertness: Awake/alert Behavior During Therapy: WFL for tasks assessed/performed Overall Cognitive Status: Impaired/Different from baseline Area of Impairment: Orientation;Safety/judgement Orientation Level: Disoriented to;Place;Time;Situation Memory: Decreased short-term memory Safety/Judgement: Decreased awareness of safety General Comments: tangential    Extremity/Trunk Assessment Lower Extremity Assessment Lower Extremity Assessment: Overall WFL for tasks assessed   Balance    End of Session PT - End of Session Equipment Utilized During Treatment: Gait  belt Activity Tolerance: Patient tolerated treatment well Patient left: in chair;with call bell/phone within reach;with family/visitor present Nurse Communication: Mobility status  GP     Breton Berns,KATHrine E 06/14/2013, 1:40 PM Zenovia Jarred, PT, DPT 06/14/2013 Pager: [phone]

## 2013-06-14 NOTE — Progress Notes (Signed)
[facility] PROGRESS NOTE  Jacob Fields WJX:[id number] DOB: [birth year] DOA: 06/12/2013 PCP: No primary provider on file.  Assessment/Plan: Principal Problem:   Acute encephalopathy: Patient seems quite alert and oriented, as commented by my partner yesterday, no evidence of encephalopathy. Patient may have had some mild confusion from dehydration but that has since resolved. Active Problems:   UTI (urinary tract infection): On IV Rocephin, cultures pending, although preliminary growing out gram-negative rods. Reports a fever likely secondary to UTI. Flu PCR is negative     Gout attack: Received one dose of IV steroids yesterday and today started on by mouth steroids. Patient is on Lasix which can sometimes precipitate gout attack. We'll start to ambulate   CKD (chronic kidney disease): Looks to be at baseline   HTN (hypertension): Blood pressure stable   Anemia: Remaining stable   Thrombocytopenia: Monitoring platelets. Chronic.  Deconditioning: Seen by physical therapy and recommending short-term skilled nursing  Code Status: Full code Family Communication: Spoke with patient's daughter at the bedside.  Disposition Plan: Skilled nursing, when bed ready  Consultants:  None  Procedures:  None  Antibiotics:  IV Rocephin day 3  HPI/Subjective: Patient doing okay. No acute distress. Breathing comfortably. No pain in foot  Objective: Filed Vitals:   06/14/13 0543  BP: 101/52  Pulse: 61  Temp: 98.2 F (36.8 C)  Resp: 18    Intake/Output Summary (Last 24 hours) at 06/14/13 1144 Last data filed at 06/14/13 0748  Gross per 24 hour  Intake    593 ml  Output    950 ml  Net   -357 ml   Filed Weights   06/12/13 2337 06/13/13 0510 06/14/13 0543  Weight: 64 kg (141 lb 1.5 oz) 64 kg (141 lb 1.5 oz) 64.3 kg (141 lb 12.1 oz)    Exam:   General:  Alert and oriented x3, no acute distress  Cardiovascular: Regular rate and rhythm, S1-S2  Respiratory: Clear to  auscultation bilaterally  Abdomen: Soft, nontender, nondistended, positive bowel sounds  Musculoskeletal: No clubbing or cyanosis, trace edema. Left toes are nontender, slightly swollen.   Data Reviewed: Basic Metabolic Panel:  Recent Labs Lab 06/12/13 1900 06/13/13 0523 06/14/13 0513  NA 131* 134* 134*  K 4.4 4.2 4.6  CL 97 101 100  CO2 20 20 21   GLUCOSE 132* 178* 158*  BUN 59* 57* 74*  CREATININE 5.23* 4.86* 4.89*  CALCIUM 9.3 9.5 9.2   Liver Function Tests:  Recent Labs Lab 06/12/13 1900 06/13/13 0523  AST 15 14  ALT 12 12  ALKPHOS 67 63  BILITOT 0.5 0.3  PROT 6.4 6.5  ALBUMIN 3.1* 2.9*   No results found for this basename: LIPASE, AMYLASE,  in the last 168 hours No results found for this basename: AMMONIA,  in the last 168 hours CBC:  Recent Labs Lab 06/12/13 1900 06/13/13 0523  WBC 8.6 6.7  NEUTROABS 7.2 6.2  HGB 8.7* 8.7*  HCT 24.9* 25.2*  MCV 93.6 93.0  PLT 148* 122*   Cardiac Enzymes: No results found for this basename: CKTOTAL, CKMB, CKMBINDEX, TROPONINI,  in the last 168 hours BNP (last 3 results) No results found for this basename: PROBNP,  in the last 8760 hours CBG: No results found for this basename: GLUCAP,  in the last 168 hours  Recent Results (from the past 240 hour(s))  URINE CULTURE     Status: None   Collection Time    06/12/13  6:07 PM  Result Value Range Status   Specimen Description URINE, CLEAN CATCH   Final   Special Requests NONE   Final   Culture  Setup Time     Final   Value: 06/13/2013 03:33     Performed at [facility] Count     Final   Value: >=100,000 COLONIES/ML     Performed at [facility]   Culture     Final   Value: GRAM NEGATIVE RODS     Performed at [facility]   Report Status PENDING   Incomplete  CULTURE, BLOOD (ROUTINE X 2)     Status: None   Collection Time    06/13/13  1:30 AM      Result Value Range Status   Specimen Description BLOOD RIGHT ANTECUBITAL    Final   Special Requests BOTTLES DRAWN AEROBIC AND ANAEROBIC 4CC   Final   Culture  Setup Time     Final   Value: 06/13/2013 14:19     Performed at [facility]   Culture     Final   Value:        BLOOD CULTURE RECEIVED NO GROWTH TO DATE CULTURE WILL BE HELD FOR 5 DAYS BEFORE ISSUING A FINAL NEGATIVE REPORT     Performed at [facility]   Report Status PENDING   Incomplete  CULTURE, BLOOD (ROUTINE X 2)     Status: None   Collection Time    06/13/13  1:40 AM      Result Value Range Status   Specimen Description BLOOD RIGHT HAND   Final   Special Requests BOTTLES DRAWN AEROBIC AND ANAEROBIC 4CC   Final   Culture  Setup Time     Final   Value: 06/13/2013 14:18     Performed at [facility]   Culture     Final   Value:        BLOOD CULTURE RECEIVED NO GROWTH TO DATE CULTURE WILL BE HELD FOR 5 DAYS BEFORE ISSUING A FINAL NEGATIVE REPORT     Performed at [facility]   Report Status PENDING   Incomplete     Studies: Ct Abdomen Pelvis Wo Contrast  06/12/2013   CLINICAL DATA:  Left-sided back pain. Lower back and bilateral groin pain.  EXAM: CT ABDOMEN AND PELVIS WITHOUT CONTRAST  TECHNIQUE: Multidetector CT imaging of the abdomen and pelvis was performed following the standard protocol without intravenous contrast.  COMPARISON:  None.  FINDINGS: There is right renal atrophy and multiple right renal stones. One stone lies in the renal pelvis. There is no hydronephrosis. The right ureter is normal in course and in caliber with no stones. There is an 8 mm low-density mass protruding laterally from the midpole the right kidney most likely a cyst. Smaller presumed cysts are noted arising from the posterior upper pole. Tiny nonobstructing stone is suggested in the upper pole of the left kidney. There are no other left renal stones and no left renal masses. There is no left hydronephrosis. Normal left ureter. The bladder is unremarkable.  Prostate is mildly enlarged.   Minor reticular subsegmental atelectasis at the lung bases. The heart is normal in size.  Liver is unremarkable. Spleen is borderline enlarged measuring 12.3 cm in longest dimension. No splenic mass or focal lesion. Gallbladder not visualized. Mild prominence of the common bile duct is noted with normal distal tapering. There is pancreatic atrophy but no pancreatic mass or inflammation. There  are no adrenal masses.  No pathologically enlarged lymph nodes are seen. There are no abnormal fluid collections.  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 There is no evidence of obstruction. No bowel wall thickening or mesenteric inflammation is seen.  There are degenerative changes of the visualized spine. No osteoblastic or osteolytic lesions.  IMPRESSION: 1. No acute findings. 2. Right renal atrophy with multiple right renal stones, but no ureteral stone or hydronephrosis. Tiny left intrarenal nonobstructing stone. 3. Borderline splenomegaly. 4. Changes from previous colon surgery. 5. Other chronic findings as described.   Electronically Signed   By: Amie Portland M.D.   On: 06/12/2013 20:08   Ct Head Wo Contrast  06/13/2013   CLINICAL DATA:  Altered mental status.  EXAM: CT HEAD WITHOUT CONTRAST  TECHNIQUE: Contiguous axial images were obtained from the base of the skull through the vertex without intravenous contrast.  COMPARISON:  None.  FINDINGS: There is no evidence of acute infarction, mass lesion, or intra- or extra-axial hemorrhage on CT.  Prominence of the ventricles and sulci reflects mild to moderate cortical volume loss. Cerebellar atrophy is noted. Scattered periventricular and subcortical white matter change likely reflects small vessel ischemic microangiopathy. Small chronic lacunar infarcts are seen in the caudate bilaterally, and at the left thalamus. A small chronic infarct is noted at the high right parietal lobe, with mild associated encephalomalacia.  The brainstem  and fourth ventricle are within normal limits. No mass effect or midline shift is seen.  There is no evidence of fracture; visualized osseous structures are unremarkable in appearance. The orbits are within normal limits. The paranasal sinuses and mastoid air cells are well-aerated. No significant soft tissue abnormalities are seen.  IMPRESSION: 1. No acute intracranial pathology seen on CT. 2. Mild to moderate cortical volume loss and scattered small vessel ischemic microangiopathy. 3. Small chronic infarct at the high right parietal lobe, with mild associated encephalomalacia; small chronic lacunar infarcts within the basal ganglia bilaterally, and at the left thalamus.   Electronically Signed   By: Roanna Raider M.D.   On: 06/13/2013 02:22   Dg Chest Port 1 View  06/12/2013   CLINICAL DATA:  Weakness, fever  EXAM: PORTABLE CHEST - 1 VIEW  COMPARISON:  None.  FINDINGS: Mild hyperinflation. Normal heart size. Negative for CHF or pneumonia. Healed numerous left rib fractures with deformity. Vascular calcifications noted compatible with atherosclerosis. No effusion or pneumothorax. Left base atelectasis versus scarring.  IMPRESSION: Chronic changes as above.  No acute process.   Electronically Signed   By: Ruel Favors M.D.   On: 06/12/2013 23:21   Dg Foot Complete Left  06/13/2013   CLINICAL DATA:  Left mid foot and heel pain with history of gout  EXAM: LEFT FOOT - COMPLETE 3+ VIEW  COMPARISON:  None.  FINDINGS: The bones of the left foot appear adequately mineralized. There is no evidence of an acute fracture nor dislocation. There is no periosteal reaction or lytic or blastic lesion. There are vascular calcifications over the midfoot. No erosions are demonstrated. The bones of the hindfoot appear intact. There is a moderate-sized plantar calcaneal spur.  IMPRESSION: There is no acute bony abnormality of the left foot. There are vascular calcifications.   Electronically Signed   By: David  [place]   On:  06/13/2013 07:43    Scheduled Meds: . cefTRIAXone (ROCEPHIN)  IV  1 g Intravenous QHS  . febuxostat  40 mg Oral BID  . ferrous  fumarate  1 tablet Oral BID  . furosemide  80 mg Oral Daily  . metoprolol  100 mg Oral q morning - 10a  . pantoprazole  40 mg Oral Daily  . predniSONE  50 mg Oral BID WC  . sodium chloride  3 mL Intravenous Q12H  . sodium chloride  3 mL Intravenous Q12H   Continuous Infusions:   Principal Problem:   Acute encephalopathy Active Problems:   UTI (urinary tract infection)   Gout attack   CKD (chronic kidney disease)   HTN (hypertension)   Anemia   Thrombocytopenia    Time spent: 25 minutes    Hollice Espy  [facility] Pager [phone]. If 7PM-7AM, please contact night-coverage at [email or web address], password [facility] 06/14/2013, 11:44 AM  LOS: 2 days

## 2013-06-15 LAB — CULTURE, BLOOD (ROUTINE X 2)

## 2013-06-15 LAB — URINE CULTURE: Colony Count: >=100000

## 2013-06-15 MED ORDER — CIPROFLOXACIN HCL 500 MG PO TABS
500.0000 mg | ORAL_TABLET | ORAL | Status: AC
  Administered 2013-06-15: 12:00:00 500 mg via ORAL
  Filled 2013-06-15: qty 1

## 2013-06-15 MED ORDER — OXYCODONE HCL 5 MG PO CAPS: 5.0000 mg | ORAL_CAPSULE | Freq: Two times a day (BID) | ORAL | Status: DC

## 2013-06-15 MED ORDER — METOPROLOL TARTRATE 50 MG PO TABS: 75.0000 mg | ORAL_TABLET | Freq: Every morning | ORAL | Status: DC

## 2013-06-15 MED ORDER — PREDNISONE 10 MG PO TABS: ORAL_TABLET | ORAL | Status: DC

## 2013-06-15 MED ORDER — CIPROFLOXACIN HCL 250 MG PO TABS: ORAL_TABLET | ORAL | Status: DC

## 2013-06-15 NOTE — Progress Notes (Signed)
Received report from previous nurse, agree with her assessment and will continue to monitor. 

## 2013-06-15 NOTE — Discharge Summary (Signed)
Physician Discharge Summary  PURCELL JUNGBLUTH ZOX:[id number] DOB: [birth year] DOA: 06/12/2013  PCP: No primary provider on file.  Admit date: 06/12/2013 Discharge date: 06/15/2013  Time spent: 25 minutes  Recommendations for Outpatient Follow-up:  1. Patient is being discharged to a skilled nursing facility. He will be followed by the attending physician there. 2. Patient is being discharged on a steroid taper for his gout. 3. His metoprolol is being increased from 50 mg to 75 mg  Discharge Diagnoses:  Principal Problem:   UTI (urinary tract infection) Active Problems:   Acute encephalopathy   Gout attack   CKD (chronic kidney disease)   HTN (hypertension)   Anemia   Thrombocytopenia   Physical deconditioning   Discharge Condition: Improved, being discharged to skilled nursing facility  Diet recommendation: Low-sodium  Filed Weights   06/13/13 0510 06/14/13 0543 06/15/13 0520  Weight: 64 kg (141 lb 1.5 oz) 64.3 kg (141 lb 12.1 oz) 67.6 kg (149 lb 0.5 oz)    History of present illness:  On 12/27 evening:Jacob Fields is a 72 y.o. male with history of chronic kidney disease, chronic anemia, hypertension, gout and who has had recent stroke in September of this year followed by right-sided carotid endarterectomy this November is visiting his daughter for Christmas. Over the last few days he has experienced increasing pain and swelling of the left foot typical of his gout exacerbation. And also last few days has been having suprapubic pain. Patient's daughter states that patient also looks confused. He also has been having some nonproductive cough. In the ER patient was found to be mildly febrile. Chest x-ray was unremarkable. UA shows features of UTI and on exam patient does have swelling of the left foot. Patient has pain on moving the left foot. Denies any trauma or fall recently. Patient otherwise denies any chest pain shortness of breath nausea vomiting diarrhea or any focal deficits.  Patient on my exam has slurred speech which patient's daughter states is chronic from his previous stroke. He is oriented but at times gets confused. CT abdomen and pelvis was showing nothing acute.   Hospital Course:  Principal Problem:   UTI (urinary tract infection): Flu PCR was negative, so source of symptoms is felt to be urinary tract infection which grew out Enterobacter pansensitive. Patient initially on IV Rocephin which was changed over to by mouth Cipro. Patient's creatinine clearance is around 10 so patient was on 3 days of IV Rocephin and received a dose of Cipro 500 mg x1 on day of discharge. He will need a 250 mg dose on the evening of 12/31 and then on the morning of 1/2 to complete a total of 7 days of therapy  Active Problems:   Acute encephalopathy: Lifetime patient was evaluated by hospitalists in the emergency room, it could be much more alert and oriented. He is felt to likely have confusion from dehydration from UTI. Since resolved. He is at his baseline.    Gout attack: Because of patient's advanced renal disease, he was put on IV Solu-Medrol and responded well to this. By hospital day 2, patient's gout pain is much improved and he was changed over to by mouth steroids. We will continue several more days for a taper.    CKD (chronic kidney disease): Patient has advanced renal disease. Creatinine on day of discharge to baseline a full 29    HTN (hypertension): Patient on metoprolol 50 mg by mouth daily. With elevated blood pressures, required increase his metoprolol will  be continued on 75 mg by mouth daily as well his his other medications-Norvasc    Anemia secondary to chronic renal disease, stable. Hemoglobin today of discharge was 8.7    Thrombocytopenia: Stable. Chronic.    Physical deconditioning: Patient is a regular physical therapy who recommended skilled nursing. Patient was amenable.  Procedures:  None  Consultations:  None  Discharge Exam: Filed  Vitals:   06/15/13 0520  BP: 117/52  Pulse: 55  Temp: 97.8 F (36.6 C)  Resp: 18    General: Alert and oriented x3, no acute distress Cardiovascular: Regular rate and rhythm, S1-S2 Respiratory: Clear to auscultation bilaterally Abdomen: Soft, nontender, nondistended, positive bowel sounds Extremities: No clubbing or cyanosis, feet are nontender.  Discharge Instructions  Discharge Orders   Future Orders Complete By Expires   Diet - low sodium heart healthy  As directed    Increase activity slowly  As directed    Walk with assistance  As directed        Medication List         amLODipine 10 MG tablet  Commonly known as:  NORVASC  Take 10 mg by mouth every morning.     atorvastatin 80 MG tablet  Commonly known as:  LIPITOR  Take 80 mg by mouth daily.     calcium acetate 667 MG capsule  Commonly known as:  PHOSLO  Take 667 mg by mouth 3 (three) times daily with meals.     ciprofloxacin 250 MG tablet  Commonly known as:  CIPRO  Take one pill on night of 12/31 & one dose on the morning of 1/2     dipyridamole-aspirin 200-25 MG per 12 hr capsule  Commonly known as:  AGGRENOX  Take 1 capsule by mouth 2 (two) times daily.     ferrous fumarate 325 (106 FE) MG Tabs tablet  Commonly known as:  HEMOCYTE - 106 mg FE  Take 1 tablet by mouth 2 (two) times daily.     furosemide 40 MG tablet  Commonly known as:  LASIX  Take 40 mg by mouth 2 (two) times daily.     metoprolol 50 MG tablet  Commonly known as:  LOPRESSOR  Take 1.5 tablets (75 mg total) by mouth every morning.     oxycodone 5 MG capsule  Commonly known as:  OXY-IR  Take 1 capsule (5 mg total) by mouth 2 (two) times daily.     potassium phosphate (monobasic) 500 MG tablet  Commonly known as:  K-PHOS ORIGINAL  Take 500-1,000 mg by mouth 2 (two) times daily with a meal. Takes 2 in the morning and 1 at night     predniSONE 10 MG tablet  Commonly known as:  DELTASONE  40 mg bid x 1 day, then 30 mg bid on 2nd  day, then 20mg  bid on 3rd day, then 10mg  bid on 4th day.     ULORIC PO  Take 20 mg by mouth 2 (two) times daily.     Vitamin D 2000 UNITS Caps  Take 2,000 Units by mouth daily.        Allergies  Allergen Reactions  . Plavix [Clopidogrel]     sores      The results of significant diagnostics from this hospitalization (including imaging, microbiology, ancillary and laboratory) are listed below for reference.    Significant Diagnostic Studies: Ct Abdomen Pelvis Wo Contrast  06/12/2013    IMPRESSION: 1. No acute findings. 2. Right renal atrophy with multiple right renal stones,  but no ureteral stone or hydronephrosis. Tiny left intrarenal nonobstructing stone. 3. Borderline splenomegaly. 4. Changes from previous colon surgery. 5. Other chronic findings as described.   Electronically Signed   By: Amie Portland M.D.   On: 06/12/2013 20:08   Ct Head Wo Contrast  06/13/2013  IMPRESSION: 1. No acute intracranial pathology seen on CT. 2. Mild to moderate cortical volume loss and scattered small vessel ischemic microangiopathy. 3. Small chronic infarct at the high right parietal lobe, with mild associated encephalomalacia; small chronic lacunar infarcts within the basal ganglia bilaterally, and at the left thalamus.   Electronically Signed   By: Roanna Raider M.D.   On: 06/13/2013 02:22   Dg Chest Port 1 View  06/12/2013    IMPRESSION: Chronic changes as above.  No acute process.   Electronically Signed   By: Ruel Favors M.D.   On: 06/12/2013 23:21   Dg Foot Complete Left  06/13/2013    IMPRESSION: There is no acute bony abnormality of the left foot. There are vascular calcifications.   Electronically Signed   By: David  [place]   On: 06/13/2013 07:43    Microbiology: Recent Results (from the past 240 hour(s))  URINE CULTURE     Status: None   Collection Time    06/12/13  6:07 PM      Result Value Range Status   Specimen Description URINE, CLEAN CATCH   Final   Special Requests  NONE   Final   Culture  Setup Time     Final   Value: 06/13/2013 03:33     Performed at [facility] Count     Final   Value: >=100,000 COLONIES/ML     Performed at [facility]   Culture     Final   Value: ENTEROBACTER CLOACAE     Performed at [facility]   Report Status 06/15/2013 FINAL   Final   Organism ID, Bacteria ENTEROBACTER CLOACAE   Final  CULTURE, BLOOD (ROUTINE X 2)     Status: None   Collection Time    06/13/13  1:30 AM      Result Value Range Status   Specimen Description BLOOD RIGHT ANTECUBITAL   Final   Special Requests BOTTLES DRAWN AEROBIC AND ANAEROBIC 4CC   Final   Culture  Setup Time     Final   Value: 06/13/2013 14:19     Performed at [facility]   Culture     Final   Value: STAPHYLOCOCCUS SPECIES (COAGULASE NEGATIVE)     Note: THE SIGNIFICANCE OF ISOLATING THIS ORGANISM FROM A SINGLE SET OF BLOOD CULTURES WHEN MULTIPLE SETS ARE DRAWN IS UNCERTAIN. PLEASE NOTIFY THE MICROBIOLOGY DEPARTMENT WITHIN ONE WEEK IF SPECIATION AND SENSITIVITIES ARE REQUIRED.     Note: Gram Stain Report Called to,Read Back By and Verified With: DEBBIE MALICK@1500  ON [id number] BY [facility]     Performed at [facility]   Report Status 06/15/2013 FINAL   Final  CULTURE, BLOOD (ROUTINE X 2)     Status: None   Collection Time    06/13/13  1:40 AM      Result Value Range Status   Specimen Description BLOOD RIGHT HAND   Final   Special Requests BOTTLES DRAWN AEROBIC AND ANAEROBIC 4CC   Final   Culture  Setup Time     Final   Value: 06/13/2013 14:18     Performed at [facility]  Final   Value:        BLOOD CULTURE RECEIVED NO GROWTH TO DATE CULTURE WILL BE HELD FOR 5 DAYS BEFORE ISSUING A FINAL NEGATIVE REPORT     Performed at [facility]   Report Status PENDING   Incomplete     Labs: Basic Metabolic Panel:  Recent Labs Lab 06/12/13 1900 06/13/13 0523 06/14/13 0513  NA 131* 134* 134*  K 4.4 4.2 4.6  CL  97 101 100  CO2 20 20 21   GLUCOSE 132* 178* 158*  BUN 59* 57* 74*  CREATININE 5.23* 4.86* 4.89*  CALCIUM 9.3 9.5 9.2   Liver Function Tests:  Recent Labs Lab 06/12/13 1900 06/13/13 0523  AST 15 14  ALT 12 12  ALKPHOS 67 63  BILITOT 0.5 0.3  PROT 6.4 6.5  ALBUMIN 3.1* 2.9*   No results found for this basename: LIPASE, AMYLASE,  in the last 168 hours No results found for this basename: AMMONIA,  in the last 168 hours CBC:  Recent Labs Lab 06/12/13 1900 06/13/13 0523  WBC 8.6 6.7  NEUTROABS 7.2 6.2  HGB 8.7* 8.7*  HCT 24.9* 25.2*  MCV 93.6 93.0  PLT 148* 122*   Cardiac Enzymes: No results found for this basename: CKTOTAL, CKMB, CKMBINDEX, TROPONINI,  in the last 168 hours BNP: BNP (last 3 results) No results found for this basename: PROBNP,  in the last 8760 hours CBG: No results found for this basename: GLUCAP,  in the last 168 hours     Signed:  Hollice Espy  [facility] 06/15/2013, 11:35 AM

## 2013-06-15 NOTE — Progress Notes (Signed)
Clinical Social Work Department BRIEF PSYCHOSOCIAL ASSESSMENT 06/15/2013  Patient:  Jacob Fields, Jacob Fields     Account Number:  [id number]     Admit date:  06/12/2013  Clinical Social Worker:  Hattie Perch  Date/Time:  06/15/2013 12:00 M  Referred by:  RN  Date Referred:  06/15/2013 Referred for  SNF Placement   Other Referral:   Interview type:  Patient Other interview type:    PSYCHOSOCIAL DATA Living Status:  ALONE Admitted from facility:   Level of care:   Primary support name:  Clarene Critchley Primary support relationship to patient:  CHILD, ADULT Degree of support available:   good    CURRENT CONCERNS Current Concerns  Post-Acute Placement   Other Concerns:    SOCIAL WORK ASSESSMENT / PLAN CSW met with patient. patient is alert and oriented X3. physical therapy recommending snf placement. CSW met with patient two seperate times. First time patient states that he wants to go home, that he has home health and he thinks he'd be able to manage at home. The second time, his son in law was at bedside. He states that his foot feels more tender and that he thinks he'd need short term snf. He is requesting [facility] in [place]. He states snf would have to be close to his home in [place].   Assessment/plan status:   Other assessment/ plan:   Information/referral to community resources:    PATIENT'S/FAMILY'S RESPONSE TO PLAN OF CARE: Patient is now reluctantly agreeable to short term snf. He is requesting it be by his home in [place], specifically, [facility]. CSW faxed information out and put a call out to [facility].

## 2013-06-15 NOTE — Progress Notes (Signed)
Clinical Social Work Department CLINICAL SOCIAL WORK PLACEMENT NOTE 06/15/2013  Patient:  Jacob Fields, Jacob Fields  Account Number:  [id number] Admit date:  06/12/2013  Clinical Social Worker:  Becky Sax, LCSW  Date/time:  06/15/2013 12:00 M  Clinical Social Work is seeking post-discharge placement for this patient at the following level of care:   SKILLED NURSING   (*CSW will update this form in Epic as items are completed)   06/15/2013  Patient/family provided with [facility] Department of Clinical Social Work's list of facilities offering this level of care within the geographic area requested by the patient (or if unable, by the patient's family).  06/15/2013  Patient/family informed of their freedom to choose among providers that offer the needed level of care, that participate in Medicare, Medicaid or managed care program needed by the patient, have an available bed and are willing to accept the patient.  06/15/2013  Patient/family informed of [facility]' ownership interest in [facility], as well as of the fact that they are under no obligation to receive care at this facility.  PASARR submitted to EDS on 06/15/2013 PASARR number received from EDS on 06/15/2013  FL2 transmitted to all facilities in geographic area requested by pt/family on  06/15/2013 FL2 transmitted to all facilities within larger geographic area on 06/15/2013  Patient informed that his/her managed care company has contracts with or will negotiate with  certain facilities, including the following:     Patient/family informed of bed offers received:  06/15/2013 Patient chooses bed at  Physician recommends and patient chooses bed at    Patient to be transferred to  on  06/15/2013 Patient to be transferred to facility by family  The following physician request were entered in Epic:   Additional Comments: patient going to [facility] in [place].

## 2013-06-15 NOTE — Progress Notes (Signed)
Gave patient bed choices. He requests [facility]. Son to transport patient. Patient cleared for discharge. Packet copied and placed in [place].  Leavy Heatherly C. Breindel Collier MSW, LCSW [phone]

## 2013-06-17 HISTORY — PX: TOTAL KNEE ARTHROPLASTY: SHX125

## 2013-06-19 LAB — CULTURE, BLOOD (ROUTINE X 2): Culture: NO GROWTH

## 2016-05-11 ENCOUNTER — Encounter (HOSPITAL_COMMUNITY): Payer: Self-pay | Admitting: Family Medicine

## 2016-05-11 ENCOUNTER — Inpatient Hospital Stay (HOSPITAL_COMMUNITY)
Admission: EM | Admit: 2016-05-11 | Discharge: 2016-05-23 | DRG: 871 | Disposition: A | Discharge status: Skilled Nursing Facility | Payer: Medicare Other | Attending: Internal Medicine | Admitting: Internal Medicine

## 2016-05-11 ENCOUNTER — Emergency Department (HOSPITAL_COMMUNITY): Payer: Medicare Other

## 2016-05-11 DIAGNOSIS — N2581 Secondary hyperparathyroidism of renal origin: Secondary | ICD-10-CM | POA: Diagnosis present

## 2016-05-11 DIAGNOSIS — Z992 Dependence on renal dialysis: Secondary | ICD-10-CM

## 2016-05-11 DIAGNOSIS — G4709 Other insomnia: Secondary | ICD-10-CM

## 2016-05-11 DIAGNOSIS — I480 Paroxysmal atrial fibrillation: Secondary | ICD-10-CM | POA: Diagnosis present

## 2016-05-11 DIAGNOSIS — S065X9A Traumatic subdural hemorrhage with loss of consciousness of unspecified duration, initial encounter: Secondary | ICD-10-CM

## 2016-05-11 DIAGNOSIS — F05 Delirium due to known physiological condition: Secondary | ICD-10-CM | POA: Diagnosis not present

## 2016-05-11 DIAGNOSIS — N390 Urinary tract infection, site not specified: Secondary | ICD-10-CM | POA: Diagnosis present

## 2016-05-11 DIAGNOSIS — R6521 Severe sepsis with septic shock: Secondary | ICD-10-CM | POA: Diagnosis not present

## 2016-05-11 DIAGNOSIS — I4510 Unspecified right bundle-branch block: Secondary | ICD-10-CM | POA: Diagnosis present

## 2016-05-11 DIAGNOSIS — Z823 Family history of stroke: Secondary | ICD-10-CM

## 2016-05-11 DIAGNOSIS — Z66 Do not resuscitate: Secondary | ICD-10-CM | POA: Diagnosis present

## 2016-05-11 DIAGNOSIS — A419 Sepsis, unspecified organism: Secondary | ICD-10-CM | POA: Diagnosis present

## 2016-05-11 DIAGNOSIS — Z932 Ileostomy status: Secondary | ICD-10-CM | POA: Diagnosis not present

## 2016-05-11 DIAGNOSIS — Z9049 Acquired absence of other specified parts of digestive tract: Secondary | ICD-10-CM

## 2016-05-11 DIAGNOSIS — G9341 Metabolic encephalopathy: Secondary | ICD-10-CM | POA: Diagnosis present

## 2016-05-11 DIAGNOSIS — D6959 Other secondary thrombocytopenia: Secondary | ICD-10-CM | POA: Diagnosis not present

## 2016-05-11 DIAGNOSIS — I350 Nonrheumatic aortic (valve) stenosis: Secondary | ICD-10-CM | POA: Diagnosis present

## 2016-05-11 DIAGNOSIS — N186 End stage renal disease: Secondary | ICD-10-CM | POA: Diagnosis not present

## 2016-05-11 DIAGNOSIS — Z888 Allergy status to other drugs, medicaments and biological substances status: Secondary | ICD-10-CM

## 2016-05-11 DIAGNOSIS — N185 Chronic kidney disease, stage 5: Secondary | ICD-10-CM

## 2016-05-11 DIAGNOSIS — I2510 Atherosclerotic heart disease of native coronary artery without angina pectoris: Secondary | ICD-10-CM | POA: Diagnosis present

## 2016-05-11 DIAGNOSIS — D631 Anemia in chronic kidney disease: Secondary | ICD-10-CM | POA: Diagnosis present

## 2016-05-11 DIAGNOSIS — G4700 Insomnia, unspecified: Secondary | ICD-10-CM | POA: Diagnosis present

## 2016-05-11 DIAGNOSIS — E876 Hypokalemia: Secondary | ICD-10-CM | POA: Diagnosis present

## 2016-05-11 DIAGNOSIS — Z8673 Personal history of transient ischemic attack (TIA), and cerebral infarction without residual deficits: Secondary | ICD-10-CM

## 2016-05-11 DIAGNOSIS — I440 Atrioventricular block, first degree: Secondary | ICD-10-CM | POA: Diagnosis present

## 2016-05-11 DIAGNOSIS — R579 Shock, unspecified: Secondary | ICD-10-CM | POA: Diagnosis not present

## 2016-05-11 DIAGNOSIS — N359 Urethral stricture, unspecified: Secondary | ICD-10-CM | POA: Diagnosis present

## 2016-05-11 DIAGNOSIS — S065XAA Traumatic subdural hemorrhage with loss of consciousness status unknown, initial encounter: Secondary | ICD-10-CM

## 2016-05-11 DIAGNOSIS — K5100 Ulcerative (chronic) pancolitis without complications: Secondary | ICD-10-CM | POA: Diagnosis not present

## 2016-05-11 DIAGNOSIS — I214 Non-ST elevation (NSTEMI) myocardial infarction: Secondary | ICD-10-CM

## 2016-05-11 DIAGNOSIS — I5042 Chronic combined systolic (congestive) and diastolic (congestive) heart failure: Secondary | ICD-10-CM | POA: Diagnosis not present

## 2016-05-11 DIAGNOSIS — R739 Hyperglycemia, unspecified: Secondary | ICD-10-CM | POA: Diagnosis present

## 2016-05-11 DIAGNOSIS — Z8249 Family history of ischemic heart disease and other diseases of the circulatory system: Secondary | ICD-10-CM

## 2016-05-11 DIAGNOSIS — I132 Hypertensive heart and chronic kidney disease with heart failure and with stage 5 chronic kidney disease, or end stage renal disease: Secondary | ICD-10-CM | POA: Diagnosis not present

## 2016-05-11 DIAGNOSIS — E785 Hyperlipidemia, unspecified: Secondary | ICD-10-CM | POA: Diagnosis present

## 2016-05-11 DIAGNOSIS — Z87442 Personal history of urinary calculi: Secondary | ICD-10-CM

## 2016-05-11 DIAGNOSIS — Z79899 Other long term (current) drug therapy: Secondary | ICD-10-CM

## 2016-05-11 DIAGNOSIS — I4891 Unspecified atrial fibrillation: Secondary | ICD-10-CM | POA: Diagnosis not present

## 2016-05-11 DIAGNOSIS — E872 Acidosis: Secondary | ICD-10-CM | POA: Diagnosis present

## 2016-05-11 DIAGNOSIS — M79609 Pain in unspecified limb: Secondary | ICD-10-CM | POA: Diagnosis not present

## 2016-05-11 DIAGNOSIS — M109 Gout, unspecified: Secondary | ICD-10-CM | POA: Diagnosis present

## 2016-05-11 LAB — COMPREHENSIVE METABOLIC PANEL
ALBUMIN: 4.1 g/dL (ref 3.5–5.0)
ALT: 21 U/L (ref 17–63)
AST: 38 U/L (ref 15–41)
Alkaline Phosphatase: 58 U/L (ref 38–126)
Anion gap: 16 — ABNORMAL HIGH (ref 5–15)
BILIRUBIN TOTAL: 1.5 mg/dL — AB (ref 0.3–1.2)
BUN: 21 mg/dL — AB (ref 6–20)
CO2: 21 mmol/L — AB (ref 22–32)
Calcium: 9.7 mg/dL (ref 8.9–10.3)
Chloride: 101 mmol/L (ref 101–111)
Creatinine, Ser: 4.74 mg/dL — ABNORMAL HIGH (ref 0.61–1.24)
GFR calc Af Amer: 13 mL/min — ABNORMAL LOW (ref >60)
GFR calc non Af Amer: 11 mL/min — ABNORMAL LOW (ref >60)
GLUCOSE: 132 mg/dL — AB (ref 65–99)
POTASSIUM: 3.3 mmol/L — AB (ref 3.5–5.1)
Sodium: 138 mmol/L (ref 135–145)
TOTAL PROTEIN: 7.6 g/dL (ref 6.5–8.1)

## 2016-05-11 LAB — PROTIME-INR
INR: 1.06
Prothrombin Time: 13.8 seconds (ref 11.4–15.2)

## 2016-05-11 LAB — I-STAT CG4 LACTIC ACID, ED: Lactic Acid, Venous: 6.28 mmol/L (ref 0.5–1.9)

## 2016-05-11 LAB — CBC WITH DIFFERENTIAL/PLATELET
Basophils Absolute: 0 10*3/uL (ref 0.0–0.1)
Basophils Relative: 0 %
Eosinophils Absolute: 0 10*3/uL (ref 0.0–0.7)
Eosinophils Relative: 0 %
HCT: 29.9 % — ABNORMAL LOW (ref 39.0–52.0)
HEMOGLOBIN: 10.7 g/dL — AB (ref 13.0–17.0)
LYMPHS ABS: 0.2 10*3/uL — AB (ref 0.7–4.0)
LYMPHS PCT: 3 %
MCH: 38.5 pg — AB (ref 26.0–34.0)
MCHC: 35.8 g/dL (ref 30.0–36.0)
MCV: 107.6 fL — AB (ref 78.0–100.0)
MONOS PCT: 3 %
Monocytes Absolute: 0.3 10*3/uL (ref 0.1–1.0)
Neutro Abs: 8.9 10*3/uL — ABNORMAL HIGH (ref 1.7–7.7)
Neutrophils Relative %: 94 %
Platelets: 157 10*3/uL (ref 150–400)
RBC: 2.78 MIL/uL — ABNORMAL LOW (ref 4.22–5.81)
RDW: 14.5 % (ref 11.5–15.5)
WBC: 9.5 10*3/uL (ref 4.0–10.5)

## 2016-05-11 LAB — MAGNESIUM: MAGNESIUM: 1.3 mg/dL — AB (ref 1.7–2.4)

## 2016-05-11 LAB — TROPONIN I: TROPONIN I: 0.42 ng/mL — AB (ref <0.03)

## 2016-05-11 MED ORDER — SODIUM CHLORIDE 0.9 % IV BOLUS (SEPSIS)
1000.0000 mL | Freq: Once | INTRAVENOUS | Status: AC
  Administered 2016-05-11: 1000 mL via INTRAVENOUS

## 2016-05-11 MED ORDER — CALCIUM ACETATE (PHOS BINDER) 667 MG PO CAPS
667.0000 mg | ORAL_CAPSULE | Freq: Three times a day (TID) | ORAL | Status: DC
  Administered 2016-05-12 – 2016-05-13 (×4): 667 mg via ORAL
  Filled 2016-05-11 (×5): qty 1

## 2016-05-11 MED ORDER — HEPARIN SODIUM (PORCINE) 5000 UNIT/ML IJ SOLN
5000.0000 [IU] | Freq: Three times a day (TID) | INTRAMUSCULAR | Status: DC
  Administered 2016-05-12 (×3): 5000 [IU] via SUBCUTANEOUS
  Filled 2016-05-11 (×4): qty 1

## 2016-05-11 MED ORDER — ONDANSETRON HCL 4 MG PO TABS: 4.0000 mg | ORAL_TABLET | Freq: Four times a day (QID) | ORAL | Status: DC | PRN

## 2016-05-11 MED ORDER — ATORVASTATIN CALCIUM 80 MG PO TABS
80.0000 mg | ORAL_TABLET | Freq: Every day | ORAL | Status: DC
  Administered 2016-05-12 – 2016-05-23 (×10): 80 mg via ORAL
  Filled 2016-05-11: qty 2
  Filled 2016-05-11: qty 1
  Filled 2016-05-11 (×2): qty 2
  Filled 2016-05-11 (×8): qty 1

## 2016-05-11 MED ORDER — SODIUM CHLORIDE 0.9 % IV BOLUS (SEPSIS)
500.0000 mL | Freq: Once | INTRAVENOUS | Status: AC
  Administered 2016-05-12: 500 mL via INTRAVENOUS

## 2016-05-11 MED ORDER — ACETAMINOPHEN 650 MG RE SUPP
650.0000 mg | Freq: Four times a day (QID) | RECTAL | Status: DC | PRN
Start: 2016-05-11 — End: 2016-05-23

## 2016-05-11 MED ORDER — PIPERACILLIN-TAZOBACTAM 3.375 G IVPB 30 MIN
3.3750 g | Freq: Once | INTRAVENOUS | Status: AC
  Administered 2016-05-11: 3.375 g via INTRAVENOUS
  Filled 2016-05-11: qty 50

## 2016-05-11 MED ORDER — ACETAMINOPHEN 325 MG PO TABS
650.0000 mg | ORAL_TABLET | Freq: Four times a day (QID) | ORAL | Status: DC | PRN
  Filled 2016-05-11: qty 2

## 2016-05-11 MED ORDER — POTASSIUM PHOSPHATE MONOBASIC 500 MG PO TABS
1000.0000 mg | ORAL_TABLET | Freq: Every day | ORAL | Status: DC
  Administered 2016-05-12 – 2016-05-13 (×2): 1000 mg via ORAL
  Filled 2016-05-11 (×2): qty 2

## 2016-05-11 MED ORDER — SODIUM CHLORIDE 0.9% FLUSH
3.0000 mL | Freq: Two times a day (BID) | INTRAVENOUS | Status: DC
  Administered 2016-05-12 – 2016-05-23 (×22): 3 mL via INTRAVENOUS

## 2016-05-11 MED ORDER — ONDANSETRON HCL 4 MG/2ML IJ SOLN
4.0000 mg | Freq: Four times a day (QID) | INTRAMUSCULAR | Status: DC | PRN
  Filled 2016-05-11: qty 2

## 2016-05-11 MED ORDER — ASPIRIN 300 MG RE SUPP: 300.0000 mg | RECTAL | Status: AC

## 2016-05-11 MED ORDER — SODIUM CHLORIDE 0.9 % IV BOLUS (SEPSIS)
500.0000 mL | Freq: Once | INTRAVENOUS | Status: AC
  Administered 2016-05-11: 500 mL via INTRAVENOUS

## 2016-05-11 MED ORDER — VANCOMYCIN HCL IN DEXTROSE 1-5 GM/200ML-% IV SOLN
1000.0000 mg | Freq: Once | INTRAVENOUS | Status: AC
  Administered 2016-05-11: 1000 mg via INTRAVENOUS
  Filled 2016-05-11: qty 200

## 2016-05-11 MED ORDER — SODIUM CHLORIDE 0.9 % IV SOLN: 250.0000 mL | INTRAVENOUS | Status: DC | PRN

## 2016-05-11 MED ORDER — ASPIRIN 81 MG PO CHEW
324.0000 mg | CHEWABLE_TABLET | ORAL | Status: AC
  Administered 2016-05-12: 324 mg via ORAL
  Filled 2016-05-11: qty 4

## 2016-05-11 NOTE — ED Notes (Signed)
Writer notified PA of abnormal I stat lactic result.  

## 2016-05-11 NOTE — ED Triage Notes (Signed)
Patient is from home and [facility]. Patient had dialysis today. He called out complaining of chills and fever. EMS administered TYLENOL 1000 mg PO. Pt's family reported to EMS that it is NOT abnormal for him to have chills or fever but he very anxious and moving around in discomfort.

## 2016-05-11 NOTE — H&P (Signed)
History and Physical  Patient Name: Jacob MangoRobert M Gambill     WUJ:[id number]    DOB: 1941-02-19    DOA: 05/11/2016 PCP: No primary care provider on file.   Patient coming from: Daughter's home  Chief Complaint: Altered mental status, fever  HPI: Jacob Fields is a 75 y.o. male with a past medical history significant for ESRD on HD MWF, ulcerative colitis s/p colectomy with end ileostomy, former stoke and gout who presents with fever and altered mental status.  The patient lives [place] in [place], [place]. He is up here to visit his daughter for the holiday.  Over the last week or more, son notes his father has complained of malaise.    Then today, the patient had his Friday dialysis treatment as a guest at a center in [place]. Afterwards, he was weak, febrile and had decreased responsiveness, so family called 9-1-1.  EMS found him altered, febrile, with chills and administered acetaminophen en route.  ED course: -Afebrile, heart rate 140s, respirations 24, BP 80/50, pulse ox normal -Na 138, K 3.3, Cr 4.74, WBC 9.5K, Hgb 10.7 -CXR without pneumonia -ECG showed new Afib, rate 130s with ?ST depressions -He was given 30 cc/kg fluids and his heart rate improved to 109, andmental status improved to be slightly alert -Cultures were obtained from blood and vancomycin/Zosyn were administered and TRH were asked to evaluate for sepsis   The patient notes his "penis blocks up" and he dilates it with a metal tool daily (although he does not make urine daily?).  Sometimes, he thinks he has gotten an infection from this.  Otherwise, he has had no cough, sputum, focal pain, abdomimal pain, diarrhea, skin sores.        ROS: Review of Systems  Constitutional: Positive for chills, diaphoresis, fever and malaise/fatigue.  Genitourinary: Positive for dysuria (penis "blocked", dilates daily).  Neurological: Positive for weakness.       Altered mental status          Past Medical History:    Diagnosis Date  . Gout   . Hypertension   . Renal disorder   . Stroke [facility](HCC)     Past Surgical History:  Procedure Laterality Date  . CAROTID ENDARTERECTOMY    . CHOLECYSTECTOMY    . COLON SURGERY    . TONSILLECTOMY      Social History: Patient lives alone.  The patient walks unassisted.  He is a retired Teacher, early years/prepharmacist. He neversmoked. He [place] in [place] near [place].    Allergies  Allergen Reactions  . Plavix [Clopidogrel]     sores    Family history: family history includes CAD in his brother; Stroke in his brother and mother.  Prior to Admission medications   Medication Sig Start Date End Date Taking? Authorizing Provider  amitriptyline (ELAVIL) 25 MG tablet Take 75 mg by mouth at bedtime. 12/29/15  Yes Historical Provider, MD  atorvastatin (LIPITOR) 80 MG tablet Take 80 mg by mouth daily.   Yes Historical Provider, MD  B Complex-C-Folic Acid (RENA-VITE PO) Take 1 tablet by mouth daily.   Yes Historical Provider, MD  calcium acetate (PHOSLO) 667 MG capsule Take 667 mg by mouth 3 (three) times daily with meals.   Yes Historical Provider, MD  colchicine 0.6 MG tablet Take 1 tablet by mouth daily as needed.   Yes Historical Provider, MD  dipyridamole-aspirin (AGGRENOX) 200-25 MG 12hr capsule Take 1 capsule by mouth 2 (two) times daily.   Yes Historical Provider, MD  Febuxostat (  ULORIC PO) Take 20 mg by mouth daily.    Yes Historical Provider, MD  fluocinonide cream (LIDEX) 0.05 % Apply 1 application topically daily as needed. 04/18/16  Yes Historical Provider, MD  potassium phosphate, monobasic, (K-PHOS ORIGINAL) 500 MG tablet Take 500-1,000 mg by mouth 2 (two) times daily with a meal. Takes 2 in the morning and 1 at night   Yes Historical Provider, MD  ciprofloxacin (CIPRO) 250 MG tablet Take one pill on night of 12/31 & one dose on the morning of 1/2 Patient not taking: Reported on 05/11/2016 06/15/13   Hollice EspySendil K Krishnan, MD  metoprolol (LOPRESSOR) 50 MG tablet Take 1.5  tablets (75 mg total) by mouth every morning. Patient not taking: Reported on 05/11/2016 06/15/13   Hollice EspySendil K Krishnan, MD  oxycodone (OXY-IR) 5 MG capsule Take 1 capsule (5 mg total) by mouth 2 (two) times daily. Patient not taking: Reported on 05/11/2016 06/15/13   Hollice EspySendil K Krishnan, MD  predniSONE (DELTASONE) 10 MG tablet 40 mg bid x 1 day, then 30 mg bid on 2nd day, then 20mg  bid on 3rd day, then 10mg  bid on 4th day. Patient not taking: Reported on 05/11/2016 06/15/13   Hollice EspySendil K Krishnan, MD       Physical Exam: BP (!) 85/54   Pulse 107   Temp 97.8 F (36.6 C) (Oral)   Resp 18   Ht 5\' 6"  (1.676 m)   Wt 79.4 kg (175 lb)   SpO2 96%   BMI 28.25 kg/m  General appearance: Small elderly adult male, awake but pale and responses slowed.  Eyes: Anicteric, conjunctiva pink, lids and lashes normal. PERRL.    ENT: No nasal deformity, discharge, epistaxis.  Hearing normal. OP with dry MM without lesions.   Neck: No neck masses.  Trachea midline.  No thyromegaly/tenderness. Lymph: No cervical or supraclavicular lymphadenopathy. Skin: Warm and dry.  No jaundice.  No suspicious rashes or lesions. Cardiac: Tachycardic, irregularly irregular, nl S1-S2, faint SEM.  No skin mottling.  JVP not visible.  No LE edema.  Radial and DP pulses 2+ and symmetric.  Carotid bruit, L > R. Respiratory: Tachypnea without accessory muscle use.  No rales or wheezes appreciated. Abdomen: Abdomen soft.  No TTP or rebound. Ostomy appears normal. No ascites, distension, hepatosplenomegaly.   MSK: No deformities or effusions.  No cyanosis or clubbing. Neuro: Cranial nerves 3-12 symmetric.  Sensation intact to light touch. Speech is fluent.  Muscle strength globally diminished.    Psych: Oriented to place, year. Not oriented to month. Responses sluggish. Judgment and insight appear impaired by sepsis.     Labs on Admission:  I have personally reviewed following labs and imaging studies: CBC:  Recent Labs Lab  05/11/16 2058  WBC 9.5  NEUTROABS 8.9*  HGB 10.7*  HCT 29.9*  MCV 107.6*  PLT 157   Basic Metabolic Panel:  Recent Labs Lab 05/11/16 2058  NA 138  K 3.3*  CL 101  CO2 21*  GLUCOSE 132*  BUN 21*  CREATININE 4.74*  CALCIUM 9.7   GFR: Estimated Creatinine Clearance: 13.1 mL/min (by C-G formula based on SCr of 4.74 mg/dL (H)).  Liver Function Tests:  Recent Labs Lab 05/11/16 2058  AST 38  ALT 21  ALKPHOS 58  BILITOT 1.5*  PROT 7.6  ALBUMIN 4.1   No results for input(s): LIPASE, AMYLASE in the last 168 hours. No results for input(s): AMMONIA in the last 168 hours. Coagulation Profile:  Recent Labs Lab 05/11/16 2058  INR 1.06   Cardiac Enzymes: No results for input(s): CKTOTAL, CKMB, CKMBINDEX, TROPONINI in the last 168 hours. BNP (last 3 results) No results for input(s): PROBNP in the last 8760 hours. HbA1C: No results for input(s): HGBA1C in the last 72 hours. CBG: No results for input(s): GLUCAP in the last 168 hours. Lipid Profile: No results for input(s): CHOL, HDL, LDLCALC, TRIG, CHOLHDL, LDLDIRECT in the last 72 hours. Thyroid Function Tests: No results for input(s): TSH, T4TOTAL, FREET4, T3FREE, THYROIDAB in the last 72 hours. Anemia Panel: No results for input(s): VITAMINB12, FOLATE, FERRITIN, TIBC, IRON, RETICCTPCT in the last 72 hours. Sepsis Labs: Lactic acid 6.2 Invalid input(s): PROCALCITONIN, LACTICIDVEN No results found for this or any previous visit (from the past 240 hour(s)).       Radiological Exams on Admission: Personally reviewed CXR shows no focal opacity: Dg Chest 2 View  Result Date: 05/11/2016 CLINICAL DATA:  75 y/o  M; fevers and chills.  Pain all over chest. EXAM: CHEST  2 VIEW COMPARISON:  06/12/2013 chest radiograph. FINDINGS: Stable cardiac silhouette given projection and technique. Aortic atherosclerosis with arch calcification and dense calcification of subclavian artery is in the axilla. Stable linear opacities at  left lung base may represent scarring or atelectasis. No focal consolidation. No pneumothorax or effusion. Chronic left-sided rib fractures IMPRESSION: Stable left basilar scarring. No new focal consolidation. No pneumothorax or effusion. Aortic atherosclerosis. Electronically Signed   By: Mitzi Hansen M.D.   On: 05/11/2016 22:13    EKG: Independently reviewed. Rate 136, atrial fibrillation, new. No previous for comparison.    Assessment/Plan  1. Sepstic shock, unclear source:  Possibly urinary source, given urinary manipulation.    Organism unknown. Patient meets criteria given tachycardia, tachypnea, fever, and evidence of organ dysfunction.  Lactate 6.2 mmol/L and repeat ordered within 6 hours.  This patient is at high risk of poor outcomes with a qSOFA score of 3 (at least 2 of the following clinical criteria: respiratory rate of 22/min or greater, altered mentation, or systolic blood pressure of 100 mm Hg or less).  Antibiotics delivered in the ED.    -Sepsis bundle utilized:  -Blood and urine cultures drawn  -30 ml/kg bolus given in ED, will repeat lactic acid  -Start targeted antibiotics with vancomycin and piperacillin-tazobactam, based on suspected source of infection    -Repeat renal function and complete blood count in AM  -Code SEPSIS called to E-link  -Recommend urgent PCCM consult for septic shock, evaluation for central line/pressors       2. ESRD on HD TThS:  -Continue Phoslo  3. New atrial fibrillation:  This is new to patient.  CHADS2Vasc 4 for age, history of stroke.  Currently rate improved to 100s with fluids. -Fluid resuscitate and treat underlying sepsis -Monitor on telemetry -Anticoagulation when medically stable -Add troponin  4. Gout:  -Hold febuxostat until more medically stable  5. History of stroke:  -Continue aspirin 325, statin  6. Ulcerative colitis:  -WOC consult  7. Other medications:  -Hold amitriptyline until more medically  stable     DVT prophylaxis: heparin  Code Status: Limited: The patient WOULD want pressors/central line.  He WOULD NOT want CPR, INTUBATION, DEFIBRILLATION.  Family Communication: Daughter and son at bedside  Disposition Plan: Anticipate IV fluids and antibiotics and Pclose hemodynamic monitoring. Consults called: PCCM Admission status: INPATIENT, stepdown unless uprgraded by [facility]        Medical decision making: Patient seen at 10:42 PM on 05/11/2016.  The patient  was discussed with Dr.Rees and Earley Favor, PA-C.  What exists of the patient's chart was reviewed in depth and summarized above.  Clinical condition: requiring additional fluidboluses for septic shock and STAT PCCM consult for ?central line.  Patient is at risk of multiorgan failure and death from sepsis.        Alberteen Sam [facility] Pager [phone]

## 2016-05-11 NOTE — ED Provider Notes (Signed)
[facility]-EMERGENCY DEPT Provider Note   CSN: [id number] Arrival date & time: 05/11/16  2032   By signing my name below, I, Clovis PuAvnee Patel, attest that this documentation has been prepared under the direction and in the presence of Earley FavorGail Cairo Lingenfelter, NP. Electronically Signed: Clovis PuAvnee Patel, ED Scribe. 05/11/16. 9:02 PM.   History   Chief Complaint Chief Complaint  Patient presents with  . Blood Infection    The history is provided by the EMS personnel. No language interpreter was used.   HPI Comments: LEVEL 5 CAVEAT DUE TO ALTERED MENTAL STATUS Jacob Fields is a 75 y.o. male who presents to the Emergency Department, via EMS, with a presentation of altered mental status. Per EMS personnel, pt presents to the ED today for fevers and chills. Pt was able to positively answer that he has a full dialysis session today.   Past Medical History:  Diagnosis Date  . Gout   . Hypertension   . Renal disorder   . Stroke [facility](HCC)     Patient Active Problem List   Diagnosis Date Noted  . Septic shock (HCC) 05/11/2016  . Hypokalemia 05/11/2016  . 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 Physical deconditioning 06/14/2013  . UTI (urinary tract infection) 06/12/2013  . Acute encephalopathy 06/12/2013  . Gout attack 06/12/2013  . ESRD (end stage renal disease) on dialysis (HCC) 06/12/2013  . HTN (hypertension) 06/12/2013  . Anemia 06/12/2013  . Thrombocytopenia (HCC) 06/12/2013    Past Surgical History:  Procedure Laterality Date  . CAROTID ENDARTERECTOMY    . CHOLECYSTECTOMY    . COLON SURGERY    . TONSILLECTOMY      Home Medications    Prior to Admission medications   Medication Sig Start Date End Date Taking? Authorizing Provider  amitriptyline (ELAVIL) 25 MG tablet Take 75 mg by mouth at bedtime. 12/29/15  Yes Historical Provider, MD  atorvastatin (LIPITOR) 80 MG tablet Take 80 mg by mouth daily.   Yes Historical Provider, MD  B Complex-C-Folic Acid (RENA-VITE PO)  Take 1 tablet by mouth daily.   Yes Historical Provider, MD  calcium acetate (PHOSLO) 667 MG capsule Take 667 mg by mouth 3 (three) times daily with meals.   Yes Historical Provider, MD  colchicine 0.6 MG tablet Take 1 tablet by mouth daily as needed.   Yes Historical Provider, MD  dipyridamole-aspirin (AGGRENOX) 200-25 MG 12hr capsule Take 1 capsule by mouth 2 (two) times daily.   Yes Historical Provider, MD  Febuxostat (ULORIC PO) Take 20 mg by mouth daily.    Yes Historical Provider, MD  fluocinonide cream (LIDEX) 0.05 % Apply 1 application topically daily as needed. 04/18/16  Yes Historical Provider, MD  potassium phosphate, monobasic, (K-PHOS ORIGINAL) 500 MG tablet Take 500-1,000 mg by mouth 2 (two) times daily with a meal. Takes 2 in the morning and 1 at night   Yes Historical Provider, MD    Family History Family History  Problem Relation Age of Onset  . Stroke Mother   . Stroke Brother   . CAD Brother     Social History Social History  Substance Use Topics  . Smoking status: Never Smoker  . Smokeless tobacco: Never Used  . Alcohol use No     Allergies   Plavix [clopidogrel]   Review of Systems Review of Systems  Unable to perform ROS: Mental status change  Constitutional: Positive for fever.  All other systems reviewed and are negative. LEVEL 5 CAVEAT DUE  TO ALTERED MENTAL STATUS   Physical Exam Updated Vital Signs BP (!) 84/55 (BP Location: Right Arm)   Pulse 94   Temp 97.8 F (36.6 C) (Oral)   Resp 17   Ht 5\' 6"  (1.676 m)   Wt 79.4 kg   SpO2 95%   BMI 28.25 kg/m   Physical Exam  Constitutional: He appears well-developed and well-nourished. He appears lethargic. He appears distressed.  HENT:  Head: Normocephalic.  Eyes: Pupils are equal, round, and reactive to light.  Neck: Normal range of motion.  Cardiovascular: Tachycardia present.   Pulmonary/Chest: Effort normal. No respiratory distress.  Abdominal: Soft. He exhibits no distension. There is no  tenderness.  Musculoskeletal: Normal range of motion.  Neurological: He appears lethargic.  Skin: He is not diaphoretic. There is pallor.  Nursing note and vitals reviewed.    ED Treatments / Results  DIAGNOSTIC STUDIES:  Oxygen Saturation is 95% on RA, normal by my interpretation.    COORDINATION OF CARE:  8:58 PM Discussed treatment plan with pt at bedside and pt agreed to plan.  Labs (all labs ordered are listed, but only abnormal results are displayed) Labs Reviewed  COMPREHENSIVE METABOLIC PANEL - Abnormal; Notable for the following:       Result Value   Potassium 3.3 (*)    CO2 21 (*)    Glucose, Bld 132 (*)    BUN 21 (*)    Creatinine, Ser 4.74 (*)    Total Bilirubin 1.5 (*)    GFR calc non Af Amer 11 (*)    GFR calc Af Amer 13 (*)    Anion gap 16 (*)    All other components within normal limits  CBC WITH DIFFERENTIAL/PLATELET - Abnormal; Notable for the following:    RBC 2.78 (*)    Hemoglobin 10.7 (*)    HCT 29.9 (*)    MCV 107.6 (*)    MCH 38.5 (*)    Neutro Abs 8.9 (*)    Lymphs Abs 0.2 (*)    All other components within normal limits  TROPONIN I - Abnormal; Notable for the following:    Troponin I 0.42 (*)    All other components within normal limits  TSH - Abnormal; Notable for the following:    TSH 4.958 (*)    All other components within normal limits  MAGNESIUM - Abnormal; Notable for the following:    Magnesium 1.3 (*)    All other components within normal limits  I-STAT CG4 LACTIC ACID, ED - Abnormal; Notable for the following:    Lactic Acid, Venous 6.28 (*)    All other components within normal limits  CULTURE, BLOOD (ROUTINE X 2)  CULTURE, BLOOD (ROUTINE X 2)  PROTIME-INR  LACTIC ACID, PLASMA  LACTIC ACID, PLASMA  COMPREHENSIVE METABOLIC PANEL  CBC  I-STAT CG4 LACTIC ACID, ED    EKG  EKG Interpretation None       Radiology Dg Chest 2 View  Result Date: 05/11/2016 CLINICAL DATA:  75 y/o  M; fevers and chills.  Pain all over  chest. EXAM: CHEST  2 VIEW COMPARISON:  06/12/2013 chest radiograph. FINDINGS: Stable cardiac silhouette given projection and technique. Aortic atherosclerosis with arch calcification and dense calcification of subclavian artery is in the axilla. Stable linear opacities at left lung base may represent scarring or atelectasis. No focal consolidation. No pneumothorax or effusion. Chronic left-sided rib fractures IMPRESSION: Stable left basilar scarring. No new focal consolidation. No pneumothorax or effusion. Aortic atherosclerosis. Electronically Signed  By: Mitzi Hansen M.D.   On: 05/11/2016 22:13    Procedures .Critical Care Performed by: Earley Favor Authorized by: Earley Favor   Critical care provider statement:    Critical care time (minutes):  90   Critical care start time:  05/12/2016 8:32 PM   Critical care end time:  05/12/2016 12:33 AM   Critical care was necessary to treat or prevent imminent or life-threatening deterioration of the following conditions:  Sepsis and shock   Critical care was time spent personally by me on the following activities:  Development of treatment plan with patient or surrogate, evaluation of patient's response to treatment, examination of patient, ordering and performing treatments and interventions, ordering and review of laboratory studies, ordering and review of radiographic studies, pulse oximetry and re-evaluation of patient's condition   I assumed direction of critical care for this patient from another provider in my specialty: yes     (including critical care time)  Medications Ordered in ED Medications  atorvastatin (LIPITOR) tablet 80 mg (not administered)  calcium acetate (PHOSLO) capsule 667 mg (not administered)  potassium phosphate (monobasic) (K-PHOS ORIGINAL) tablet 1,000 mg (not administered)  heparin injection 5,000 Units (not administered)  sodium chloride flush (NS) 0.9 % injection 3 mL (3 mLs Intravenous Given 05/12/16  0021)  acetaminophen (TYLENOL) tablet 650 mg (not administered)    Or  acetaminophen (TYLENOL) suppository 650 mg (not administered)  ondansetron (ZOFRAN) tablet 4 mg (not administered)    Or  ondansetron (ZOFRAN) injection 4 mg (not administered)  0.9 %  sodium chloride infusion (not administered)  aspirin chewable tablet 324 mg (not administered)    Or  aspirin suppository 300 mg (not administered)  norepinephrine (LEVOPHED) 4 mg in dextrose 5 % 250 mL (0.016 mg/mL) infusion (2 mcg/min Intravenous New Bag/Given 05/12/16 0024)  potassium phosphate (monobasic) (K-PHOS ORIGINAL) tablet 500 mg (not administered)  sodium chloride 0.9 % bolus 1,000 mL (0 mLs Intravenous Stopped 05/11/16 2213)    And  sodium chloride 0.9 % bolus 1,000 mL (0 mLs Intravenous Stopped 05/11/16 2314)    And  sodium chloride 0.9 % bolus 500 mL (0 mLs Intravenous Stopped 05/12/16 0001)  vancomycin (VANCOCIN) IVPB 1000 mg/200 mL premix (0 mg Intravenous Stopped 05/11/16 2324)  piperacillin-tazobactam (ZOSYN) IVPB 3.375 g (0 g Intravenous Stopped 05/11/16 2213)  sodium chloride 0.9 % bolus 500 mL (500 mLs Intravenous New Bag/Given 05/12/16 0015)     Initial Impression / Assessment and Plan / ED Course  I have reviewed the triage vital signs and the nursing notes.  Pertinent labs & imaging results that were available during my care of the patient were reviewed by me and considered in my medical decision making (see chart for details).  Clinical Course      Septic patient with AMS Lactic acid of 6.28 Had dialysis today normally has chills and low grade temp after dialysis tonight worse than normal  Jacob Fields reassessed after fluid resuscitation has begun and he is now alert and oriented can answer questions.  He denies any pain Final Clinical Impressions(s) / ED Diagnoses   Final diagnoses:  Septic shock (HCC)    New Prescriptions New Prescriptions   No medications on file  I personally performed the services  described in this documentation, which was scribed in my presence. The recorded information has been reviewed and is accurate.    Earley Favor, NP 05/11/16 2204    Earley Favor, NP 05/12/16 0034    Tilden Fossa, MD 05/12/16 [phone]

## 2016-05-11 NOTE — ED Notes (Signed)
Bed: WA06 Expected date:  Expected time:  Means of arrival:  Comments: 75 year old M fever dialysis pt. Hyperglycemia ( room 25)

## 2016-05-11 NOTE — ED Notes (Signed)
Patient's daughter reports patient places a rod up his penis to open "him up" to urinate. Pt reported to daughter that he had problems last Tuesday that he could not urinate. [facility] to [facility]. Patient reported to daughter today that he was problems urinating again. She reports he is not very sanitary when placing rod into his penis.

## 2016-05-11 NOTE — H&P (Signed)
PULMONARY / CRITICAL CARE MEDICINE   Name: Jacob Fields MRN: [id number] DOB: May 01, 1941    ADMISSION DATE:  05/11/2016  CHIEF COMPLAINT:  Fever, chills.  HISTORY OF PRESENT ILLNESS:   Mr. Jacob Fields is a 75 y/o man with ESRD on HD who is visiting family in the area who [facility] with fever, chills, and sepsis. Please see H&P by Dr. Maryfrances Bunnellanford for complete details, but briefly, following HD today, he developed worsening fever and chills and presented to the ED with septic shock.  PAST MEDICAL HISTORY :  He  has a past medical history of Gout; Hypertension; Renal disorder; and Stroke (HCC).  PAST SURGICAL HISTORY: He  has a past surgical history that includes Tonsillectomy; Cholecystectomy; Carotid endarterectomy; and Colon surgery.  Allergies  Allergen Reactions  . Plavix [Clopidogrel]     sores    No current facility-administered medications on file prior to encounter.    Current Outpatient Prescriptions on File Prior to Encounter  Medication Sig  . atorvastatin (LIPITOR) 80 MG tablet Take 80 mg by mouth daily.  . calcium acetate (PHOSLO) 667 MG capsule Take 667 mg by mouth 3 (three) times daily with meals.  . Febuxostat (ULORIC PO) Take 20 mg by mouth daily.   . potassium phosphate, monobasic, (K-PHOS ORIGINAL) 500 MG tablet Take 500-1,000 mg by mouth 2 (two) times daily with a meal. Takes 2 in the morning and 1 at night    FAMILY HISTORY:  His indicated that the status of his mother is unknown. He indicated that the status of his brother is unknown.    SOCIAL HISTORY: He  reports that he has never smoked. He has never used smokeless tobacco. He reports that he does not drink alcohol or use drugs.  REVIEW OF SYSTEMS:   Per HPI  SUBJECTIVE:  75 y/o man with septic shock from likely endovascular infection.  VITAL SIGNS: BP (!) 84/67   Pulse 107   Temp 97.8 F (36.6 C) (Oral)   Resp 12   Ht 5\' 6"  (1.676 m)   Wt 175 lb (79.4 kg)   SpO2 98%   BMI 28.25 kg/m    HEMODYNAMICS:    VENTILATOR SETTINGS:    INTAKE / OUTPUT: No intake/output data recorded.  PHYSICAL EXAMINATION: General: Elderly man in appearing in mild distress Neuro:  Awake, alert, oriented HEENT:  MMM Cardiovascular:  Irregular HR Lungs:  Diminished breath sounds Abdomen:  Ostomy appears healthy and intact Musculoskeletal:  No deformation Skin:  No rashes on visible skin  LABS:  BMET  Recent Labs Lab 05/11/16 2058  NA 138  K 3.3*  CL 101  CO2 21*  BUN 21*  CREATININE 4.74*  GLUCOSE 132*    Electrolytes  Recent Labs Lab 05/11/16 2058  CALCIUM 9.7    CBC  Recent Labs Lab 05/11/16 2058  WBC 9.5  HGB 10.7*  HCT 29.9*  PLT 157    Coag's  Recent Labs Lab 05/11/16 2058  INR 1.06    Sepsis Markers  Recent Labs Lab 05/11/16 2112  LATICACIDVEN 6.28*    ABG No results for input(s): PHART, PCO2ART, PO2ART in the last 168 hours.  Liver Enzymes  Recent Labs Lab 05/11/16 2058  AST 38  ALT 21  ALKPHOS 58  BILITOT 1.5*  ALBUMIN 4.1    Cardiac Enzymes No results for input(s): TROPONINI, PROBNP in the last 168 hours.  Glucose No results for input(s): GLUCAP in the last 168 hours.  Imaging Dg Chest  2 View  Result Date: 05/11/2016 CLINICAL DATA:  75 y/o  M; fevers and chills.  Pain all over chest. EXAM: CHEST  2 VIEW COMPARISON:  06/12/2013 chest radiograph. FINDINGS: Stable cardiac silhouette given projection and technique. Aortic atherosclerosis with arch calcification and dense calcification of subclavian artery is in the axilla. Stable linear opacities at left lung base may represent scarring or atelectasis. No focal consolidation. No pneumothorax or effusion. Chronic left-sided rib fractures IMPRESSION: Stable left basilar scarring. No new focal consolidation. No pneumothorax or effusion. Aortic atherosclerosis. Electronically Signed   By: Mitzi HansenLance  Furusawa-Stratton M.D.   On: 05/11/2016 22:13     STUDIES:   None  CULTURES: Blood x2 05/11/16 >>  ANTIBIOTICS: Vanc 05/11/16 >> Pip-tazo 05/11/16 >>  SIGNIFICANT EVENTS: None  LINES/TUBES: PIV x2 L UA fistula  DISCUSSION: 75 y/o man with septic shock likely from accessing his dialysis access.  ASSESSMENT / PLAN:  PULMONARY A: No active issues P:    CARDIOVASCULAR A:  Shock, due to sepsis AF (newly diagnosed) P:  Will start peripheral levophed, and will place CVC if necessary Will withhold AC for now, but consider prior to discharge given CHADS2Vasc of 4  RENAL A:   ESRD on HD Hx of Gout P:   [facility], but not for ~48 hrs. Hold febuxostat for now  GASTROINTESTINAL A:   Hx of Ulcerative Colitis P:   WOC consult  HEMATOLOGIC A:   No active issues P:   INFECTIOUS A:   Septic Shock P:   Maintain on vanc/zosyn Narrow based on cultures Consider u/s of graft based on culture results  ENDOCRINE A:   No active issues   P:    NEUROLOGIC A:   Hx of stroke P:   Continue ASA/Statin Consider AC.   FAMILY  - Updates:  - Inter-disciplinary family meet or Palliative Care meeting due by:  05/18/16   CRITICAL CARE Performed by: Jamie KatoRIMBLE, Lennell Shanks   Total critical care time: 85 minutes  Critical care time was exclusive of separately billable procedures and treating other patients.  Critical care was necessary to treat or prevent imminent or life-threatening deterioration.  Critical care was time spent personally by me on the following activities: development of treatment plan with patient and/or surrogate as well as nursing, discussions with consultants, evaluation of patient's response to treatment, examination of patient, obtaining history from patient or surrogate, ordering and performing treatments and interventions, ordering and review of laboratory studies, ordering and review of radiographic studies, pulse oximetry and re-evaluation of patient's  condition.  Jamie KatoAaron Sharren Schnurr, MD Pulmonary and Critical Care Medicine [facility] Pager: [phone]  05/11/2016, 11:45 PM

## 2016-05-11 NOTE — ED Notes (Signed)
Pt transported to radiology.

## 2016-05-12 ENCOUNTER — Inpatient Hospital Stay (HOSPITAL_COMMUNITY): Payer: Medicare Other

## 2016-05-12 DIAGNOSIS — R748 Abnormal levels of other serum enzymes: Secondary | ICD-10-CM

## 2016-05-12 DIAGNOSIS — R579 Shock, unspecified: Secondary | ICD-10-CM

## 2016-05-12 DIAGNOSIS — R6521 Severe sepsis with septic shock: Secondary | ICD-10-CM

## 2016-05-12 DIAGNOSIS — Z992 Dependence on renal dialysis: Secondary | ICD-10-CM

## 2016-05-12 DIAGNOSIS — N186 End stage renal disease: Secondary | ICD-10-CM

## 2016-05-12 DIAGNOSIS — A419 Sepsis, unspecified organism: Principal | ICD-10-CM

## 2016-05-12 DIAGNOSIS — E876 Hypokalemia: Secondary | ICD-10-CM

## 2016-05-12 DIAGNOSIS — M79609 Pain in unspecified limb: Secondary | ICD-10-CM

## 2016-05-12 HISTORY — PX: TRANSTHORACIC ECHOCARDIOGRAM: SHX275

## 2016-05-12 LAB — COMPREHENSIVE METABOLIC PANEL
ALBUMIN: 3.3 g/dL — AB (ref 3.5–5.0)
ALT: 23 U/L (ref 17–63)
AST: 45 U/L — AB (ref 15–41)
Alkaline Phosphatase: 51 U/L (ref 38–126)
Anion gap: 9 (ref 5–15)
BILIRUBIN TOTAL: 1 mg/dL (ref 0.3–1.2)
BUN: 25 mg/dL — AB (ref 6–20)
CHLORIDE: 107 mmol/L (ref 101–111)
CO2: 22 mmol/L (ref 22–32)
Calcium: 8.7 mg/dL — ABNORMAL LOW (ref 8.9–10.3)
Creatinine, Ser: 5.02 mg/dL — ABNORMAL HIGH (ref 0.61–1.24)
GFR calc Af Amer: 12 mL/min — ABNORMAL LOW (ref >60)
GFR calc non Af Amer: 10 mL/min — ABNORMAL LOW (ref >60)
GLUCOSE: 180 mg/dL — AB (ref 65–99)
POTASSIUM: 4.2 mmol/L (ref 3.5–5.1)
SODIUM: 138 mmol/L (ref 135–145)
Total Protein: 6.3 g/dL — ABNORMAL LOW (ref 6.5–8.1)

## 2016-05-12 LAB — CBC
HEMATOCRIT: 25.8 % — AB (ref 39.0–52.0)
Hemoglobin: 8.9 g/dL — ABNORMAL LOW (ref 13.0–17.0)
MCH: 38.4 pg — AB (ref 26.0–34.0)
MCHC: 34.5 g/dL (ref 30.0–36.0)
MCV: 111.2 fL — AB (ref 78.0–100.0)
Platelets: 124 10*3/uL — ABNORMAL LOW (ref 150–400)
RBC: 2.32 MIL/uL — ABNORMAL LOW (ref 4.22–5.81)
RDW: 14.9 % (ref 11.5–15.5)
WBC: 14.3 10*3/uL — ABNORMAL HIGH (ref 4.0–10.5)

## 2016-05-12 LAB — GLUCOSE, CAPILLARY
Glucose-Capillary: 168 mg/dL — ABNORMAL HIGH (ref 65–99)
Glucose-Capillary: 143 mg/dL — ABNORMAL HIGH (ref 65–99)
Glucose-Capillary: 96 mg/dL (ref 65–99)

## 2016-05-12 LAB — MRSA PCR SCREENING: MRSA BY PCR: NEGATIVE

## 2016-05-12 LAB — TROPONIN I
Troponin I: 3.4 ng/mL (ref <0.03)
Troponin I: 4.88 ng/mL (ref <0.03)
Troponin I: 4.74 ng/mL (ref <0.03)

## 2016-05-12 LAB — LACTIC ACID, PLASMA
LACTIC ACID, VENOUS: 2.5 mmol/L — AB (ref 0.5–1.9)
LACTIC ACID, VENOUS: 3.2 mmol/L — AB (ref 0.5–1.9)

## 2016-05-12 LAB — ECHOCARDIOGRAM COMPLETE
Height: 66 in
Weight: 2483.26 oz

## 2016-05-12 LAB — TSH: TSH: 4.958 u[IU]/mL — ABNORMAL HIGH (ref 0.350–4.500)

## 2016-05-12 MED ORDER — VANCOMYCIN HCL IN DEXTROSE 1-5 GM/200ML-% IV SOLN: 1000.0000 mg | INTRAVENOUS | Status: DC

## 2016-05-12 MED ORDER — NOREPINEPHRINE BITARTRATE 1 MG/ML IV SOLN
0.0000 ug/min | INTRAVENOUS | Status: DC
  Administered 2016-05-12: 2 ug/min via INTRAVENOUS
  Filled 2016-05-12: qty 4

## 2016-05-12 MED ORDER — PIPERACILLIN-TAZOBACTAM IN DEX 2-0.25 GM/50ML IV SOLN
2.2500 g | Freq: Four times a day (QID) | INTRAVENOUS | Status: DC
  Filled 2016-05-12: qty 50

## 2016-05-12 MED ORDER — PIPERACILLIN-TAZOBACTAM 3.375 G IVPB
3.3750 g | Freq: Two times a day (BID) | INTRAVENOUS | Status: DC
  Administered 2016-05-12 – 2016-05-18 (×13): 3.375 g via INTRAVENOUS
  Filled 2016-05-12 (×15): qty 50

## 2016-05-12 MED ORDER — HEPARIN (PORCINE) IN NACL 100-0.45 UNIT/ML-% IJ SOLN
1050.0000 [IU]/h | INTRAMUSCULAR | Status: DC
  Administered 2016-05-12: 850 [IU]/h via INTRAVENOUS
  Administered 2016-05-13: 1050 [IU]/h via INTRAVENOUS
  Filled 2016-05-12 (×2): qty 250

## 2016-05-12 MED ORDER — PHENYLEPHRINE HCL 10 MG/ML IJ SOLN
0.0000 ug/min | INTRAVENOUS | Status: DC
  Administered 2016-05-12: 15 ug/min via INTRAVENOUS
  Administered 2016-05-12: 20 ug/min via INTRAVENOUS
  Administered 2016-05-13: 10 ug/min via INTRAVENOUS
  Filled 2016-05-12 (×3): qty 1

## 2016-05-12 MED ORDER — MAGNESIUM SULFATE 50 % IJ SOLN
3.0000 g | Freq: Once | INTRAMUSCULAR | Status: AC
  Administered 2016-05-12: 3 g via INTRAVENOUS
  Filled 2016-05-12: qty 6

## 2016-05-12 MED ORDER — VANCOMYCIN HCL IN DEXTROSE 750-5 MG/150ML-% IV SOLN
750.0000 mg | INTRAVENOUS | Status: DC
  Filled 2016-05-12: qty 150

## 2016-05-12 MED ORDER — POTASSIUM PHOSPHATE MONOBASIC 500 MG PO TABS
500.0000 mg | ORAL_TABLET | Freq: Every day | ORAL | Status: DC
  Administered 2016-05-12: 500 mg via ORAL
  Filled 2016-05-12: qty 1

## 2016-05-12 MED ORDER — HEPARIN BOLUS VIA INFUSION
3500.0000 [IU] | Freq: Once | INTRAVENOUS | Status: AC
  Administered 2016-05-12: 3500 [IU] via INTRAVENOUS
  Filled 2016-05-12: qty 3500

## 2016-05-12 MED ORDER — ASPIRIN 325 MG PO TABS
325.0000 mg | ORAL_TABLET | Freq: Every day | ORAL | Status: DC
  Administered 2016-05-12 – 2016-05-13 (×2): 325 mg via ORAL
  Filled 2016-05-12 (×2): qty 1

## 2016-05-12 MED ORDER — SODIUM CHLORIDE 0.9 % IV BOLUS (SEPSIS)
2000.0000 mL | Freq: Once | INTRAVENOUS | Status: AC
  Administered 2016-05-12: 2000 mL via INTRAVENOUS

## 2016-05-12 NOTE — Progress Notes (Signed)
ANTICOAGULATION CONSULT NOTE - Initial Consult  Pharmacy Consult for heparin Indication: ACS/atrial fibrillation  Allergies  Allergen Reactions  . Plavix [Clopidogrel]     sores    Patient Measurements: Height: 5\' 6"  (167.6 cm) Weight: 155 lb 3.3 oz (70.4 kg) IBW/kg (Calculated) : 63.8 Heparin Dosing Weight: actual body weight  Vital Signs: Temp: 98.5 F (36.9 C) (11/26 1600) Temp Source: Oral (11/26 1600) BP: 105/58 (11/26 1800)  Labs:  Recent Labs  05/11/16 2057 05/11/16 2058 05/12/16 0340 05/12/16 0725 05/12/16 1315  HGB  --  10.7* 8.9*  --   --   HCT  --  29.9* 25.8*  --   --   PLT  --  157 124*  --   --   LABPROT  --  13.8  --   --   --   INR  --  1.06  --   --   --   CREATININE  --  4.74* 5.02*  --   --   TROPONINI 0.42*  --   --  3.40* 4.88*    Estimated Creatinine Clearance: 11.3 mL/min (by C-G formula based on SCr of 5.02 mg/dL (H)).   Medical History: Past Medical History:  Diagnosis Date  . Gout   . Hypertension   . Renal disorder   . Stroke [facility](HCC)     Assessment: [age] y/oM with PMH of ESRD on HD, stroke, ulcerative colitis, and gout who is visiting family in the area who presented to the ED with fever, chills, and AMS following HD session today. Patient currently being treated for septic shock. Patient also noted to be in a-fib with RVR in the ED. Noted to have elevated troponins (0.42 > 3.4 > 4.88). Pharmacy consulted to assist with dosing of heparin infusion for ACS/a-fib.   -Baseline PT/INR WNL  -CBC: Hgb decreased to 8.9 (appears around baseline), Pltc low at 124K (per MD, likely splenic sequestration with sepsis) -No bleeding issues reported -No anticoagulants PTA (was on Aggrenox PTA, currently on full dose ASA inpatient) -SQ heparin 5000 units SQ last given at 1458  Goal of Therapy:  Heparin level 0.3-0.7 units/ml Monitor platelets by anticoagulation protocol: Yes   Plan:  -Heparin 3500 units IV bolus x 1 (giving a little smaller  bolus due to proximity of SQ heparin dose earlier this afternoon) -Then start heparin infusion at 850 units/hr -Heparin level 8 hours after start of infusion -Daily heparin level and CBC while on heparin infusion -Monitor closely for s/sx of bleeding   Greer PickerelJigna Tadan Shill, PharmD, BCPS Pager: [phone] 05/12/2016 6:32 PM

## 2016-05-12 NOTE — Progress Notes (Signed)
Pharmacy Antibiotic Note  Jacob MangoRobert M Fields is a 75 y.o. male admitted on 05/11/2016 with sepsis.  Pharmacy has been consulted for Vancomycin and Zosyn  dosing.  Plan: Vancomycin 750mg  IV after every HD (MWF).  Goal trough 15-20 mcg/mL. Zosyn 3.375g IV Q12hr infused over 4hrs.   Height: 5\' 6"  (167.6 cm) Weight: 155 lb 3.3 oz (70.4 kg) IBW/kg (Calculated) : 63.8  Temp (24hrs), Avg:97.9 F (36.6 C), Min:97.8 F (36.6 C), Max:98.2 F (36.8 C)   Recent Labs Lab 05/11/16 2058 05/11/16 2112 05/12/16 0003  WBC 9.5  --   --   CREATININE 4.74*  --   --   LATICACIDVEN  --  6.28* 3.2*    Estimated Creatinine Clearance: 12 mL/min (by C-G formula based on SCr of 4.74 mg/dL (H)).    Allergies  Allergen Reactions  . Plavix [Clopidogrel]     sores    Antimicrobials this admission: Vancomycin 05/11/2016 >> Zosyn 05/11/2016 >>   Dose adjustments this admission: -  Microbiology results: pending  Thank you for allowing pharmacy to be a part of this patient's care.  Aleene DavidsonGrimsley Jr, Lashica Hannay Crowford 05/12/2016 3:00 AM

## 2016-05-12 NOTE — Progress Notes (Signed)
eLink Physician-Brief Progress Note Patient Name: Jacob MangoRobert M Fields DOB: 1940-12-12 MRN: [id number]   Date of Service  05/12/2016  HPI/Events of Note  ESRD patient admitted with sepsis.  Remains on pressors.  Climbing cardiac enzymes with abnormal EKG and echo.  Suspect NSTEMI.    eICU Interventions  Cardiology consulted.  Case discussed. Continue heparin and aspirin.  Unable to give bblocker because of BP     Intervention Category Major Interventions: Other:  [name], [name], [name] 05/12/2016, 7:56 PM

## 2016-05-12 NOTE — Progress Notes (Signed)
eLink Physician-Brief Progress Note Patient Name: Jacob MangoRobert M Fields DOB: Mar 28, 1941 MRN: [id number]   Date of Service  05/12/2016  HPI/Events of Note  Patient hospitalized with sepsis.  Noted to have trop 0.42-> 3.40-> 4.88.  Echo with EF of 40% with some RWMA.  No chest pain presently.  eICU Interventions  EKG now Aspirin Heparin drip     Intervention Category Intermediate Interventions: OtherHenry Russel:  Emira Eubanks, [name] 05/12/2016, 5:47 PM

## 2016-05-12 NOTE — Progress Notes (Signed)
*  PRELIMINARY RESULTS*gram 2D Echocardiogram has been performed.  Leta JunglingCooper, Therma Lasure M 05/12/2016, 10:16 AM

## 2016-05-12 NOTE — Progress Notes (Signed)
CRITICAL VALUE ALERT  Critical value received:  Troponin 3.40  Date of notification:  05/12/16  Time of notification:  0835  Critical value read back:Yes.    Nurse who received alert:  Heloise PurpuraSusan Tonnette Zwiebel  MD notified (1st page):  Dr Jamison NeighborNestor   Time of first page:  Face to face discussion  MD notified (2nd page):  Time of second page:  Responding MD:  Dr Jamison NeighborNestor   Time MD responded:  [phone]

## 2016-05-12 NOTE — ED Notes (Signed)
Gave report to MadisonQueenth, RCharity fundraiser

## 2016-05-12 NOTE — Progress Notes (Signed)
PULMONARY / CRITICAL CARE MEDICINE   Name: Jacob MangoRobert M Fields MRN: [id number] DOB: 1941/04/04    ADMISSION DATE:  05/11/2016  CHIEF COMPLAINT:  Fever, chills.  HISTORY OF PRESENT ILLNESS:   Mr. Jacob Fields is a 75 y/o man with ESRD on HD who is visiting family in the area who [facility] with fever, chills, and sepsis. Please see H&P by Dr. Maryfrances Bunnellanford for complete details, but briefly, following HD today, he developed worsening fever and chills and presented to the ED with septic shock.  SUBJECTIVE: Patient weaned further on peripheral vasopressor support. Patient denies any subjective fever, chills, or sweats since yesterday at his daughter's house. Denies any difficulty breathing or cough.  REVIEW OF SYSTEMS: Did have some nausea yesterday but denies any diarrhea/increased output from his ostomy. No chest pain or pressure. No headache or vision changes.  VITAL SIGNS: BP (!) 124/48   Pulse 89   Temp 97.9 F (36.6 C) (Oral)   Resp 15   Ht 5\' 6"  (1.676 m)   Wt 155 lb 3.3 oz (70.4 kg)   SpO2 97%   BMI 25.05 kg/m   HEMODYNAMICS:    VENTILATOR SETTINGS:    INTAKE / OUTPUT: No intake/output data recorded.  PHYSICAL EXAMINATION: General:  Awake. Alert. Laying in bed. no family at bedside.  Integument:  Warm & dry. No rash on exposed skin. Bandage in place over left upper extremity AV graft. HEENT:  Moist mucus membranes. No scleral injection or icterus.  Cardiovascular:  Regular rate. No edema. No appreciable JVD.  Pulmonary:  Normal work of breathing on room air. Clear on auscultation. Speaking in complete sentences. Abdomen: Soft. Normal bowel sounds. Nondistended. Grossly nontender. Neurological: Alert and oriented 4. Cranial nerves grossly intact.  LABS:  BMET  Recent Labs Lab 05/11/16 2058 05/12/16 0340  NA 138 138  K 3.3* 4.2  CL 101 107  CO2 21* 22  BUN 21* 25*  CREATININE 4.74* 5.02*  GLUCOSE 132* 180*    Electrolytes  Recent Labs Lab 05/11/16 2058  05/11/16 2105 05/12/16 0340  CALCIUM 9.7  --  8.7*  MG  --  1.3*  --     CBC  Recent Labs Lab 05/11/16 2058 05/12/16 0340  WBC 9.5 14.3*  HGB 10.7* 8.9*  HCT 29.9* 25.8*  PLT 157 124*    Coag's  Recent Labs Lab 05/11/16 2058  INR 1.06    Sepsis Markers  Recent Labs Lab 05/11/16 2112 05/12/16 0003 05/12/16 0340  LATICACIDVEN 6.28* 3.2* 2.5*    ABG No results for input(s): PHART, PCO2ART, PO2ART in the last 168 hours.  Liver Enzymes  Recent Labs Lab 05/11/16 2058 05/12/16 0340  AST 38 45*  ALT 21 23  ALKPHOS 58 51  BILITOT 1.5* 1.0  ALBUMIN 4.1 3.3*    Cardiac Enzymes  Recent Labs Lab 05/11/16 2057  TROPONINI 0.42*    Glucose No results for input(s): GLUCAP in the last 168 hours.  Imaging Dg Chest 2 View  Result Date: 05/11/2016 CLINICAL DATA:  75 y/o  M; fevers and chills.  Pain all over chest. EXAM: CHEST  2 VIEW COMPARISON:  06/12/2013 chest radiograph. FINDINGS: Stable cardiac silhouette given projection and technique. Aortic atherosclerosis with arch calcification and dense calcification of subclavian artery is in the axilla. Stable linear opacities at left lung base may represent scarring or atelectasis. No focal consolidation. No pneumothorax or effusion. Chronic left-sided rib fractures IMPRESSION: Stable left basilar scarring. No new focal consolidation. No pneumothorax  or effusion. Aortic atherosclerosis. Electronically Signed   By: Mitzi HansenLance  Furusawa-Stratton M.D.   On: 05/11/2016 22:13     STUDIES:  EKG 11/25:  Atrial fibrillation & RBBB.  MICROBIOLOGY: Blood x2 05/11/16 >> MRSA PCR >>  ANTIBIOTICS: Vancomycin 11/25 >> Zosyn 11/25 >>  SIGNIFICANT EVENTS: 11/25 - Admit  LINES/TUBES: PIV x2 L UA fistula  ASSESSMENT / PLAN:  CARDIOVASCULAR A:  Shock - Likely sepsis. Elevated Troponin I - Likely demand ischemia. Atrial Fibrillation - Newly diagnosed. H/O Carotid Artery Stenosis - S/P CEA. H/O HTN &  Hyperlipidemia  P:  Continuous telemetry monitoring Vitals per unit protocol Goal MAP >65 Neo-Synephrine as needed to maintain MAP D/C Levophed Holding on anticoagulation for now Continuing home Lipitor 80mg  Checking Complete Echocardiogram Trending Troponin I q6hr Holding home Lopressor 2L NS bolus over 2 hours now  INFECTIOUS A:   Septic Shock Possible Bacteremia/Fistula Infection  P:   Empiric Vancomycin & Zosyn Day #2 Awaiting culture results Checking Vascular U/S of AV Graft  RENAL A:   ESRD on HD Lactic Acidosis - Resolving. Hypomagnesemia - Replaced. Hypokalemia - Resolved.  P:   Trending electrolytes daily Replacing electrolytes as indicated Hold febuxostat for now Magnesium Sulfate 3gm IV now Continuing home KPhos BID & Phoslo TID  PULMONARY A: No active issues.  P:   Continuous pulse oximetry.   GASTROINTESTINAL A:   H/O Ulcerative Colitis - S/P Ostomy.  P:   Wound Care/Ostomy Consult Heart Healthy Diet  HEMATOLOGIC A:   Anemia - No signs of active bleeding. Thrombocytopenia - Mild. Likely splenic sequestration w/ sepsis. Leukocytosis - Likely due to sepsis.  P:  Trending cell counts daily w/ CBC SCDs Heparin  q8hr  ENDOCRINE A:   Hyperglycemia - No h/o DM.  P: Accu-Checks qAC & HS w/ MD notification parameters Checking Hgb A1c    NEUROLOGIC A:   H/O Stroke  P:   Continue Lipitor Monitor closely.  FAMILY  - Updates: Patient updated at bedside 11/26 by Dr. Jamison NeighborNestor.  - Inter-disciplinary family meet or Palliative Care meeting due by:  05/18/16  TODAY'S SUMMARY:  75 y.o. man with end-stage renal disease on hemodialysis admitted with septic shock. This is quite possibly secondary to a bacteremia with his dialysis yesterday and symptoms starting yesterday after dialysis continuing IV fluid resuscitation in an effort to discontinue peripheral vasopressor support. Awaiting culture results while continuing broad-spectrum  antibiotics. Checking echocardiogram as I continue to trend troponin I. Holding on systemic anticoagulation at this time.  I have spent a total of 31 minutes of critical care time today caring for the patient and reviewing the patient's electronic medical record.  Donna ChristenJennings E. Jamison NeighborNestor, M.D. [facility] Pager:  [phone] After 3pm or if no response, call [phone] 05/12/2016, 6:50 AM

## 2016-05-12 NOTE — Consult Note (Signed)
Urology Consult  Consulting MD: Jamison NeighborNestor  CC: Difficulty urinating, possible urosepsis  HPI: This is a 75 year old male was admitted earlier today with sepsis.  The patient is on chronic hemodialysis.  [place]  [place].  He is currently visiting family.  He has had increasing malaise over the past few days, and recently has had significant shakes and chills as well as altered mental status.  He is admitted for management of probable sepsis.  The patient does have a history of urologic issues.  He apparently has meatal stenosis.  He is on regular dilation of his meatus with the dilator-he performs this at home.  The patient does make urine every other day.  He has a history of frequent urinary tract infections, but now is considered to have pyuria, which is his normal state.  He has not been on antibiotics for this in some time.  He has not noticed blood or pus in his urine recently.  In the intensive care unit, he has a feeling of urgency prior to the consultation, and a residual urine volume measured with a bladder scan of 100 mL.  Prior to my visiting his bedside, he urinated approximately 100 mL.  PMH: Past Medical History:  Diagnosis Date  . Gout   . Hypertension   . Renal disorder   . Stroke [facility](HCC)     PSH: Past Surgical History:  Procedure Laterality Date  . CAROTID ENDARTERECTOMY    . CHOLECYSTECTOMY    . COLON SURGERY    . TONSILLECTOMY      Allergies: Allergies  Allergen Reactions  . Plavix [Clopidogrel]     sores    Medications: Prescriptions Prior to Admission  Medication Sig Dispense Refill Last Dose  . amitriptyline (ELAVIL) 25 MG tablet Take 75 mg by mouth at bedtime.   05/10/2016 at Unknown time  . atorvastatin (LIPITOR) 80 MG tablet Take 80 mg by mouth daily.   05/10/2016 at Unknown time  . B Complex-C-Folic Acid (RENA-VITE PO) Take 1 tablet by mouth daily.   05/10/2016 at Unknown  time  . calcium acetate (PHOSLO) 667 MG capsule Take 667 mg by mouth 3 (three) times daily with meals.   05/10/2016 at Unknown time  . colchicine 0.6 MG tablet Take 1 tablet by mouth daily as needed.   unknown  . dipyridamole-aspirin (AGGRENOX) 200-25 MG 12hr capsule Take 1 capsule by mouth 2 (two) times daily.   05/10/2016 at Unknown time  . Febuxostat (ULORIC PO) Take 20 mg by mouth daily.    05/10/2016 at Unknown time  . fluocinonide cream (LIDEX) 0.05 % Apply 1 application topically daily as needed.   unknown  . potassium phosphate, monobasic, (K-PHOS ORIGINAL) 500 MG tablet Take 500-1,000 mg by mouth 2 (two) times daily with a meal. Takes 2 in the morning and 1 at night   05/10/2016 at Unknown time     Social History: Social History   Social History  . Marital status: Divorced    Spouse name: N/A  . Number of children: N/A  . Years of education: N/A   Occupational History  . Not on file.   Social History Main Topics  . Smoking status: Never Smoker  . Smokeless tobacco: Never Used  . Alcohol use No  . Drug use: No  . Sexual activity: Not Currently   Other Topics Concern  . Not on file   Social History Narrative  . No  narrative on file    Family History: Family History  Problem Relation Age of Onset  . Stroke Mother   . Stroke Brother   . CAD Brother     Review of Systems: Positive: Having to strain to urinate, bladder pressure Negative:   A further 10 point review of systems was negative except what is listed in the HPI.  Physical Exam: @VITALS2 @ General: Awake.  Somewhat lethargic, but answers questions appropriately. Head:  Normocephalic.  Atraumatic. ENT:  EOMI.  Mucous membranes moist Neck:  Normal range of motion CV:  Sinus tachycardia Pulmonary: Normal inspiratory and expiratory excursion Abdomen: Soft.  Non- tender to palpation. Skin:  Normal turgor.  No visible rash. Extremity: No gross deformity of bilateral upper extremities.  No gross deformity  of bilateral lower extremities. Neurologic: Alert. Appropriate mood.  Penis:  circumcised.  No lesions.  He had a hypospadiac meatus with some meatal stenosis. Urethra: No Foley catheter in place.  Coronal hypospadiac meatus. Scrotum: No lesions.  No ecchymosis.  No erythema. Testicles: Descended bilaterally.  No masses bilaterally. Epididymis: Palpable bilaterally.  Non Tender to palpation.  Studies:  Recent Labs     05/11/16  2058  05/12/16  0340  HGB  10.7*  8.9*  WBC  9.5  14.3*  PLT  157  124*    Recent Labs     05/11/16  2058  05/12/16  0340  NA  138  138  K  3.3*  4.2  CL  101  107  CO2  21*  22  BUN  21*  25*  CREATININE  4.74*  5.02*  CALCIUM  9.7  8.7*  GFRNONAA  11*  10*  GFRAA  13*  12*     Recent Labs     05/11/16  2058  INR  1.06     Invalid input(s): ABG    Assessment:  1.  Meatal stenosis-I don't think that this is technically an issue at this point.  2.  Possible UTI-the urine is somewhat cloudy.  Prior to consultation, bladder volume was scanned at 100 mL.  Following that, he voided approximately 90 mL.  I do not think he needs a catheter present time  Plan: 1.  At this point, I do not think he needs any catheterization, as more than likely his bladder has been emptied by his natural void.  2.  Urine is present for culture.  3.  I will sign off-if further urologic assistance needed, please call back.    [phone]

## 2016-05-12 NOTE — Progress Notes (Signed)
VASCULAR LAB PRELIMINARY  PRELIMINARY  PRELIMINARY  PRELIMINARY  Left upper extremity venous duplex completed.    Preliminary report:  Left :  No evidence of DVT or superficial thrombosis. A - V Fistula appears patent    Latosha Gaylord, RVS 05/12/2016, 9:03 AM

## 2016-05-12 NOTE — ED Notes (Signed)
Notified Pharmacy to verify all orders for patient.

## 2016-05-13 LAB — RENAL FUNCTION PANEL
ALBUMIN: 3.5 g/dL (ref 3.5–5.0)
Anion gap: 9 (ref 5–15)
BUN: 37 mg/dL — AB (ref 6–20)
CALCIUM: 9 mg/dL (ref 8.9–10.3)
CO2: 20 mmol/L — AB (ref 22–32)
CREATININE: 6.32 mg/dL — AB (ref 0.61–1.24)
Chloride: 106 mmol/L (ref 101–111)
GFR calc Af Amer: 9 mL/min — ABNORMAL LOW (ref >60)
GFR calc non Af Amer: 8 mL/min — ABNORMAL LOW (ref >60)
GLUCOSE: 114 mg/dL — AB (ref 65–99)
Phosphorus: 3.5 mg/dL (ref 2.5–4.6)
Potassium: 4.4 mmol/L (ref 3.5–5.1)
SODIUM: 135 mmol/L (ref 135–145)

## 2016-05-13 LAB — GLUCOSE, CAPILLARY
GLUCOSE-CAPILLARY: 211 mg/dL — AB (ref 65–99)
GLUCOSE-CAPILLARY: 164 mg/dL — AB (ref 65–99)
Glucose-Capillary: 102 mg/dL — ABNORMAL HIGH (ref 65–99)
Glucose-Capillary: 93 mg/dL (ref 65–99)

## 2016-05-13 LAB — CBC WITH DIFFERENTIAL/PLATELET
BASOS PCT: 0 %
Basophils Absolute: 0 10*3/uL (ref 0.0–0.1)
EOS ABS: 0 10*3/uL (ref 0.0–0.7)
EOS PCT: 1 %
HEMATOCRIT: 25.7 % — AB (ref 39.0–52.0)
Hemoglobin: 8.8 g/dL — ABNORMAL LOW (ref 13.0–17.0)
Lymphocytes Relative: 7 %
Lymphs Abs: 0.5 10*3/uL — ABNORMAL LOW (ref 0.7–4.0)
MCH: 37.3 pg — ABNORMAL HIGH (ref 26.0–34.0)
MCHC: 34.2 g/dL (ref 30.0–36.0)
MCV: 108.9 fL — ABNORMAL HIGH (ref 78.0–100.0)
MONO ABS: 0.5 10*3/uL (ref 0.1–1.0)
MONOS PCT: 7 %
NEUTROS ABS: 6 10*3/uL (ref 1.7–7.7)
Neutrophils Relative %: 85 %
PLATELETS: 89 10*3/uL — AB (ref 150–400)
RBC: 2.36 MIL/uL — ABNORMAL LOW (ref 4.22–5.81)
RDW: 14.9 % (ref 11.5–15.5)
WBC: 7.1 10*3/uL (ref 4.0–10.5)

## 2016-05-13 LAB — HEPARIN LEVEL (UNFRACTIONATED)
HEPARIN UNFRACTIONATED: 0.37 [IU]/mL (ref 0.30–0.70)
Heparin Unfractionated: 0.19 IU/mL — ABNORMAL LOW (ref 0.30–0.70)

## 2016-05-13 LAB — CREATININE, SERUM
CREATININE: 6.25 mg/dL — AB (ref 0.61–1.24)
GFR calc Af Amer: 9 mL/min — ABNORMAL LOW (ref >60)
GFR, EST NON AFRICAN AMERICAN: 8 mL/min — AB (ref >60)

## 2016-05-13 LAB — MAGNESIUM: Magnesium: 2 mg/dL (ref 1.7–2.4)

## 2016-05-13 LAB — TROPONIN I: Troponin I: 3.79 ng/mL (ref <0.03)

## 2016-05-13 MED ORDER — MIDODRINE HCL 5 MG PO TABS
2.5000 mg | ORAL_TABLET | Freq: Three times a day (TID) | ORAL | Status: DC
Start: 2016-05-13 — End: 2016-05-23
  Administered 2016-05-13 – 2016-05-23 (×26): 2.5 mg via ORAL
  Filled 2016-05-13 (×3): qty 1
  Filled 2016-05-13: qty 0.5
  Filled 2016-05-13 (×11): qty 1
  Filled 2016-05-13: qty 0.5
  Filled 2016-05-13 (×5): qty 1
  Filled 2016-05-13: qty 0.5
  Filled 2016-05-13 (×4): qty 1

## 2016-05-13 MED ORDER — VANCOMYCIN HCL IN DEXTROSE 750-5 MG/150ML-% IV SOLN
750.0000 mg | INTRAVENOUS | Status: DC
  Filled 2016-05-13: qty 150

## 2016-05-13 MED ORDER — HEPARIN SODIUM (PORCINE) 5000 UNIT/ML IJ SOLN
5000.0000 [IU] | Freq: Three times a day (TID) | INTRAMUSCULAR | Status: DC
  Administered 2016-05-14: 5000 [IU] via SUBCUTANEOUS
  Filled 2016-05-13: qty 1

## 2016-05-13 MED ORDER — HEPARIN SODIUM (PORCINE) 5000 UNIT/ML IJ SOLN: 5000.0000 [IU] | Freq: Three times a day (TID) | INTRAMUSCULAR | Status: DC

## 2016-05-13 MED ORDER — ASPIRIN 81 MG PO CHEW
81.0000 mg | CHEWABLE_TABLET | Freq: Every day | ORAL | Status: DC
  Administered 2016-05-13 – 2016-05-23 (×9): 81 mg via ORAL
  Filled 2016-05-13 (×10): qty 1

## 2016-05-13 MED ORDER — TICAGRELOR 90 MG PO TABS
90.0000 mg | ORAL_TABLET | Freq: Two times a day (BID) | ORAL | Status: DC
  Administered 2016-05-13 – 2016-05-23 (×18): 90 mg via ORAL
  Filled 2016-05-13 (×21): qty 1

## 2016-05-13 MED ORDER — CALCIUM ACETATE (PHOS BINDER) 667 MG PO CAPS
1334.0000 mg | ORAL_CAPSULE | Freq: Three times a day (TID) | ORAL | Status: DC
  Administered 2016-05-13 – 2016-05-23 (×27): 1334 mg via ORAL
  Filled 2016-05-13 (×28): qty 2

## 2016-05-13 NOTE — Progress Notes (Signed)
ANTICOAGULATION CONSULT NOTE - Initial Consult  Pharmacy Consult for heparin Indication: ACS/afib  Allergies  Allergen Reactions  . Plavix [Clopidogrel]     sores    Patient Measurements: Height: 5\' 6"  (167.6 cm) Weight: 154 lb 5.2 oz (70 kg) IBW/kg (Calculated) : 63.8 Heparin Dosing Weight: 70 kg  Vital Signs: Temp: 98 F (36.7 C) (11/27 0800) Temp Source: Oral (11/27 0800) BP: 90/62 (11/27 1000)  Labs:  Recent Labs  05/11/16 2058 05/12/16 0340  05/12/16 1315 05/12/16 1925 05/13/16 0121 05/13/16 1010  HGB 10.7* 8.9*  --   --   --  8.8*  --   HCT 29.9* 25.8*  --   --   --  25.7*  --   PLT 157 124*  --   --   --  89*  --   LABPROT 13.8  --   --   --   --   --   --   INR 1.06  --   --   --   --   --   --   HEPARINUNFRC  --   --   --   --   --  0.37 0.19*  CREATININE 4.74* 5.02*  --   --   --  6.32*  --   TROPONINI  --   --   < > 4.88* 4.74*  --  3.79*  < > = values in this interval not displayed.  Estimated Creatinine Clearance: 9 mL/min (by C-G formula based on SCr of 6.32 mg/dL (H)).   Medical History: Past Medical History:  Diagnosis Date  . Gout   . Hypertension   . Renal disorder   . Stroke [facility](HCC)     Assessment: [age] y/oM with PMH of ESRD on HD (TTS), stroke, ulcerative colitis, and gout who is visiting family in the area who presented to the ED with fever, chills, and AMS following HD session 05/12/16. Patient also noted to be in a-fib with RVR in the ED. Noted to have elevated troponins. Pharmacy consulted to assist with dosing of heparin infusion for ACS/a-fib.   Today, 05/13/2016: - heparin level is sub-therapeutic at 0.19 - Troponin remains elevated - hgb low but stable, plt down to 89 - no bleeding documented   Goal of Therapy:  Heparin level 0.3-0.7 units/ml Monitor platelets by anticoagulation protocol: Yes   Plan:  - heparin 2000 units IV bolus x1, then increase drip to 1050 units/hr - Heparin level 8 hours after start of infusion -  Monitor closely for s/sx of bleeding   Dorna LeitzAnh Jamesmichael Shadd, PharmD, BCPS 05/13/2016 11:23 AM

## 2016-05-13 NOTE — Consult Note (Signed)
CARDIOLOGY CONSULT NOTE  Patient ID: Jacob MangoRobert M Ziemba MRN: [id number] DOB/AGE: 12/15/1940 75 y.o.  Admit date: 05/11/2016 Referring Physician: Internal Medicine Primary Physician:  No primary care provider on file. Reason for Consultation: NSTEMI, Afib  HPI: Jacob Fields  is a 75 y.o. male with He has a history of HTN, HLD, ulcerative colitis s/p colectomy with end ileostomy, and three-vessel CAD by coronary angiogram on 10/04/2015. Due to multiple comorbidities including ESRD on HD, medical therapy was recommended. He also has a history of paroxysmal atrial fibrillation, not on anticoagulation, presumably due to h/o  hemorrhagic CVA in 2011 and h/o carotid endarterectomy on 05/11/2013, later developed allergy to clopidogrel but has been stable on ASA. Echocardiogram on 08/13/2015 revealed low normal LVEF of 45-50% with mild global LV hypokinesis. [place] in [place], [place].   He is in [place] visiting family for Thanksgiving and had dialysis on Friday as scheduled. Later that day, he developed altered mental status and he was brought to the hospital. He was febrile and tachycardic on arrival, labwork revealed lactate of 6.2 and he was started on IV abx.   Initial EKG revealed a.fib with RVR at a rate of 136, he converted to sinus rhythm with IV fluids. Troponin also elevated, peaked at 4.88, now trending down. We were consulted for evaluation and management of atrial fibrillation and NSTEMI.   Past Medical History:  Diagnosis Date  . Gout   . Hypertension   . Renal disorder   . Stroke [facility](HCC)      Past Surgical History:  Procedure Laterality Date  . CAROTID ENDARTERECTOMY    . CHOLECYSTECTOMY    . COLON SURGERY    . TONSILLECTOMY       Family History  Problem Relation Age of Onset  . Stroke Mother   . Stroke Brother   . CAD Brother      Social History: Social History   Social History  . Marital status: Divorced    Spouse name: N/A  . Number of children:  N/A  . Years of education: N/A   Occupational History  . Not on file.   Social History Main Topics  . Smoking status: Never Smoker  . Smokeless tobacco: Never Used  . Alcohol use No  . Drug use: No  . Sexual activity: Not Currently   Other Topics Concern  . Not on file   Social History Narrative  . No narrative on file     Prescriptions Prior to Admission  Medication Sig Dispense Refill Last Dose  . amitriptyline (ELAVIL) 25 MG tablet Take 75 mg by mouth at bedtime.   05/10/2016 at Unknown time  . atorvastatin (LIPITOR) 80 MG tablet Take 80 mg by mouth daily.   05/10/2016 at Unknown time  . B Complex-C-Folic Acid (RENA-VITE PO) Take 1 tablet by mouth daily.   05/10/2016 at Unknown time  . calcium acetate (PHOSLO) 667 MG capsule Take 1,334 mg by mouth 3 (three) times daily with meals.    05/10/2016 at Unknown time  . colchicine 0.6 MG tablet Take 1 tablet by mouth daily as needed.   unknown  . dipyridamole-aspirin (AGGRENOX) 200-25 MG 12hr capsule Take 1 capsule by mouth 2 (two) times daily.   05/10/2016 at Unknown time  . Febuxostat (ULORIC PO) Take 20 mg by mouth daily.    05/10/2016 at Unknown time  . fluocinonide cream (LIDEX) 0.05 % Apply 1 application topically daily as needed.   unknown  . [DISCONTINUED] potassium  phosphate, monobasic, (K-PHOS ORIGINAL) 500 MG tablet Take 500-1,000 mg by mouth 2 (two) times daily with a meal. Takes 2 in the morning and 1 at night   05/10/2016 at Unknown time   ROS: General: Currently no chills, malaise Eyes: no blurry vision, diplopia, or amaurosis ENT: no sore throat or hearing loss Resp: no SOB, cough, wheezing, or hemoptysis CV: no chest pain, edema, or palpitations GI: no abdominal pain, nausea, vomiting, diarrhea, or constipation Skin: no rash Neuro: no headache, numbness, tingling, or weakness of extremities Musculoskeletal: no joint pain or swelling Heme: no bleeding, DVT, or easy bruising Endo: no polydipsia or polyuria     Physical Exam: Blood pressure (!) 121/54, pulse 89, temperature 98 F (36.7 C), temperature source Oral, resp. rate 14, height 5\' 6"  (1.676 m), weight 70 kg (154 lb 5.2 oz), SpO2 100 %.   General appearance: alert, cooperative, appears stated age and no distress Neck: no JVD and left carotid bruit Lungs: clear to auscultation bilaterally Heart: S1, S2 normal and III/IV SEM over RUSB Extremities: extremities normal, atraumatic, no cyanosis or edema Pulses: 2+ and symmetric Neurologic: Grossly normal  Labs:   Lab Results  Component Value Date   WBC 7.1 05/13/2016   HGB 8.8 (L) 05/13/2016   HCT 25.7 (L) 05/13/2016   MCV 108.9 (H) 05/13/2016   PLT 89 (L) 05/13/2016    Recent Labs Lab 05/12/16 0340 05/13/16 0121  NA 138 135  K 4.2 4.4  CL 107 106  CO2 22 20*  BUN 25* 37*  CREATININE 5.02* 6.32*  CALCIUM 8.7* 9.0  PROT 6.3*  --   BILITOT 1.0  --   ALKPHOS 51  --   ALT 23  --   AST 45*  --   GLUCOSE 180* 114*    Lipid Panel  No results found for: CHOL, TRIG, HDL, CHOLHDL, VLDL, LDLCALC  BNP (last 3 results) No results for input(s): BNP in the last 8760 hours.  HEMOGLOBIN A1C No results found for: HGBA1C, MPG  Cardiac Panel (last 3 results)  Recent Labs  05/12/16 1315 05/12/16 1925 05/13/16 1010  TROPONINI 4.88* 4.74* 3.79*    Lab Results  Component Value Date   TROPONINI 3.79 (HH) 05/13/2016     TSH  Recent Labs  05/11/16 2104  TSH 4.958*   EKG 05/11/2016: atrial fibrillation with RVR at a rate of 136 bpm, 1mm ST depression in anterolateral leads. Normal QT.   EKG 05/13/2016: sinus rhythm with 1st degree AV block at a rate of 77bpm, normal axis, RBBB, 1mm ST depression in anterolateral leads, non specific T wave abnormality in lead III and aVF.  Echo 05/12/2016:  Mild basal septal LV hypertrophy with LVEF approximately 40%. There is hypokinesis of the mid to basal inferolateral and inferior walls. Grade 2 diastolic dysfunction. Mild left  atrial enlargement. Calcified mitral annulus with trivial mitral regurgitation. Mildly dilated ascending aorta. Mild to moderate calcific aortic stenosis as outlined above. Mildly reduced right ventricular contraction. Trivial tricuspid regurgitation.   Radiology: Dg Chest 2 View  Result Date: 05/11/2016 CLINICAL DATA:  75 y/o  M; fevers and chills.  Pain all over chest. EXAM: CHEST  2 VIEW COMPARISON:  06/12/2013 chest radiograph. FINDINGS: Stable cardiac silhouette given projection and technique. Aortic atherosclerosis with arch calcification and dense calcification of subclavian artery is in the axilla. Stable linear opacities at left lung base may represent scarring or atelectasis. No focal consolidation. No pneumothorax or effusion. Chronic left-sided rib fractures IMPRESSION: Stable left  basilar scarring. No new focal consolidation. No pneumothorax or effusion. Aortic atherosclerosis. Electronically Signed   By: Mitzi HansenLance  Furusawa-Stratton M.D.   On: 05/11/2016 22:13    Scheduled Meds: . aspirin  325 mg Oral Daily  . atorvastatin  80 mg Oral Daily  . calcium acetate  1,334 mg Oral TID WC  . midodrine  2.5 mg Oral TID WC  . piperacillin-tazobactam (ZOSYN)  IV  3.375 g Intravenous Q12H  . sodium chloride flush  3 mL Intravenous Q12H  . [START ON 05/14/2016] vancomycin  750 mg Intravenous Q T,Th,Sa-HD   Continuous Infusions: . heparin 1,050 Units/hr (05/13/16 1139)  . phenylephrine (NEO-SYNEPHRINE) Adult infusion 5 mcg/min (05/13/16 1057)   PRN Meds:.sodium chloride, acetaminophen **OR** acetaminophen, ondansetron **OR** ondansetron (ZOFRAN) IV  ASSESSMENT AND PLAN:  1. NSTEMI 2. Paroxysmal atrial fibrillation (CHA2DS2-VASCScore: Risk Score 7,  Yearly risk of stroke 9.6%. OAC Has Bled: Score 5.  Estimated risk of major bleeding at 1 year with OAC 4.9-19.6%. Not on anticoagulation due to h/o hemorrhagic CVA.) 3. Sepsis 4. Three-vessel CAD by coronary angiogram on 10/04/2015 in [place],  medical therapy recommended 5. ESRD on HD 6. Essential HTN 7. HLD 8. S/P Carotid Endarterectomy on 05/11/2013 9. Mild to moderate aortic stenosis  Recommendation: NSTEMI likely secondary to demand ischemia from sepsis. No shortness of breath, chest pain, or symptoms suggestive of anginal equivalent. He is currently on heparin gtt. Will continue the same for now. Unable to use beta blockers due to hypotension. He has history of PAF, presumably not on anticoagulation due to h/o hemorrhagic CVA. He is currently in sinus rhythm. Although echo abnormal, it has not changed significantly since echo in February 2017. Given multiple co-morbidities and guarded prognosis, would not recommended any further interventions from a cardiac standpoint and would recommend follow up with cardiologist in [place] once discharged.  Erling Contellison,Garnetta Fedrick Nicole, NP-C 05/13/2016, 12:22 PM [facility]. [facility] Pager: [phone] Office: [phone] If no answer Cell [phone]

## 2016-05-13 NOTE — Progress Notes (Signed)
PULMONARY / CRITICAL CARE MEDICINE   Name: Jacob MangoRobert M Mcmartin MRN: [id number] DOB: 1940-09-29    ADMISSION DATE:  05/11/2016  CHIEF COMPLAINT:  Fever, chills.  HISTORY OF PRESENT ILLNESS:   Mr. Jacob Fields is a 75 y/o man with ESRD on HD who is visiting family in the area who presents [facility] ED with fever, chills, and sepsis. Please see H&P by Dr. Maryfrances Bunnellanford for complete details, but briefly, following HD today, he developed worsening fever and chills and presented to the ED with septic shock.  SUBJECTIVE: Feeling a little more short of breath   VITAL SIGNS: BP 125/76 (BP Location: Right Arm)   Pulse 89   Temp 98 F (36.7 C) (Oral)   Resp 14   Ht 5\' 6"  (1.676 m)   Wt 154 lb 5.2 oz (70 kg)   SpO2 100%   BMI 24.91 kg/m   HEMODYNAMICS:    VENTILATOR SETTINGS:    INTAKE / OUTPUT: I/O last 3 completed shifts: In: 6469.5 [I.V.:4313.5; IV Piggyback:2156] Out: 1105 [Urine:280; Stool:825]  PHYSICAL EXAMINATION: General:  Awake. Alert. Laying in bed. no family at bedside.  Integument:  Warm & dry. No rash on exposed skin. Bandage in place over left upper extremity AV graft.  HEENT:  Moist mucus membranes. No scleral injection or icterus.  Cardiovascular:  Regular rate. No edema. No appreciable JVD.  Pulmonary:  Normal work of breathing on room air. Speaking in complete sentences. Crackles in bases  Abdomen: Soft. Normal bowel sounds. Nondistended. Grossly nontender. Neurological: Alert and oriented 4. Cranial nerves grossly intact.  LABS:  BMET  Recent Labs Lab 05/11/16 2058 05/12/16 0340 05/13/16 0121  NA 138 138 135  K 3.3* 4.2 4.4  CL 101 107 106  CO2 21* 22 20*  BUN 21* 25* 37*  CREATININE 4.74* 5.02* 6.32*  GLUCOSE 132* 180* 114*    Electrolytes  Recent Labs Lab 05/11/16 2058 05/11/16 2105 05/12/16 0340 05/13/16 0121  CALCIUM 9.7  --  8.7* 9.0  MG  --  1.3*  --  2.0  PHOS  --   --   --  3.5    CBC  Recent Labs Lab 05/11/16 2058 05/12/16 0340  05/13/16 0121  WBC 9.5 14.3* 7.1  HGB 10.7* 8.9* 8.8*  HCT 29.9* 25.8* 25.7*  PLT 157 124* 89*    Coag's  Recent Labs Lab 05/11/16 2058  INR 1.06    Sepsis Markers  Recent Labs Lab 05/11/16 2112 05/12/16 0003 05/12/16 0340  LATICACIDVEN 6.28* 3.2* 2.5*    ABG No results for input(s): PHART, PCO2ART, PO2ART in the last 168 hours.  Liver Enzymes  Recent Labs Lab 05/11/16 2058 05/12/16 0340 05/13/16 0121  AST 38 45*  --   ALT 21 23  --   ALKPHOS 58 51  --   BILITOT 1.5* 1.0  --   ALBUMIN 4.1 3.3* 3.5    Cardiac Enzymes  Recent Labs Lab 05/12/16 0725 05/12/16 1315 05/12/16 1925  TROPONINI 3.40* 4.88* 4.74*    Glucose  Recent Labs Lab 05/12/16 0747 05/12/16 1826 05/12/16 2119 05/13/16 0727  GLUCAP 168* 143* 96 102*    Imaging No results found.   STUDIES:  EKG 11/25:  Atrial fibrillation & RBBB.  MICROBIOLOGY: Blood x2 05/11/16 >> MRSA PCR >>  ANTIBIOTICS: Vancomycin 11/25 >> Zosyn 11/25 >>  SIGNIFICANT EVENTS: 11/25 - Admit  LINES/TUBES: PIV x2 L UA fistula  ASSESSMENT / PLAN:  CARDIOVASCULAR A:  Shock - Likely sepsis. Elevated Troponin I -  Likely demand ischemia. Atrial Fibrillation - Newly diagnosed. H/O Carotid Artery Stenosis - S/P CEA. H/O HTN & Hyperlipidemia  P:  Continuous telemetry monitoring Vitals per unit protocol Goal MAP >65 Neo-Synephrine as needed to maintain MAP Heparin gtt  Continuing home Lipitor 80mg  f/u Complete Echocardiogram Trending Troponin I q6hr Cards following  Add midodrine   INFECTIOUS A:   Septic Shock Possible Bacteremia/Fistula Infection?   P:   Empiric Vancomycin & Zosyn Day #3 Awaiting culture results f/u Vascular U/S of AV Graft  RENAL A:   ESRD on HD (T/T/S) P:   Trending electrolytes daily Replacing electrolytes as indicated Hold febuxostat for now Continuing home KPhos BID & Phoslo TID Ask renal to see   PULMONARY A: Dyspnea  -->at risk for volume  overload   P:   Continuous pulse oximetry.  HD planned for 11/28  GASTROINTESTINAL A:   H/O Ulcerative Colitis - S/P Ostomy.  P:   Wound Care/Ostomy Consult Heart Healthy Diet  HEMATOLOGIC A:   Anemia - No signs of active bleeding. Thrombocytopenia - Mild. Likely splenic sequestration w/ sepsis. Leukocytosis - Likely due to sepsis.  P:  Trending cell counts daily w/ CBC SCDs Heparin [place] q8hr  ENDOCRINE A:   Hyperglycemia - No h/o DM.  P: Accu-Checks qAC & HS w/ MD notification parameters Checking Hgb A1c    NEUROLOGIC A:   H/O Stroke  P:   Continue Lipitor Monitor closely.  FAMILY  - Updates: Patient updated at bedside 11/26 by Dr. Jamison NeighborNestor.  - Inter-disciplinary family meet or Palliative Care meeting due by:  05/18/16  TODAY'S SUMMARY:  75 y.o. man with end-stage renal disease on hemodialysis admitted with septic shock on 11/25 following HD. Source remains unclear. ? UT source as he dilates his ureteral meatus daily vs AVG site infection. All cultures negative to date. Course has been c/b NSTEMI. Cards following. He has baseline hypotension "at times they have to keep me at dialysis until they get a BP they like". Will add low dose midodrine. Plan for today: add midodrine, wean neo, cont abx as we await culture data and get him over to [facility] as he will need HD by tomorrow especially as his WOB is getting a little worse.   Simonne MartinetPeter E William Laske ACNP-BC [facility] Pulmonary/Critical Care Pager # [phone] OR # [phone] if no answer  My critical care time 30 minutes

## 2016-05-13 NOTE — Progress Notes (Signed)
ANTICOAGULATION CONSULT NOTE - Follow Up Consult  Pharmacy Consult for Heparin Indication: ACS/atrial fibrillation  Allergies  Allergen Reactions  . Plavix [Clopidogrel]     sores    Patient Measurements: Height: 5\' 6"  (167.6 cm) Weight: 155 lb 3.3 oz (70.4 kg) IBW/kg (Calculated) : 63.8 Heparin Dosing Weight:   Vital Signs: Temp: 98.8 F (37.1 C) (11/27 0000) Temp Source: Oral (11/27 0000) BP: 142/74 (11/27 0200)  Labs:  Recent Labs  05/11/16 2058 05/12/16 0340 05/12/16 0725 05/12/16 1315 05/12/16 1925 05/13/16 0121  HGB 10.7* 8.9*  --   --   --  8.8*  HCT 29.9* 25.8*  --   --   --  25.7*  PLT 157 124*  --   --   --  89*  LABPROT 13.8  --   --   --   --   --   INR 1.06  --   --   --   --   --   HEPARINUNFRC  --   --   --   --   --  0.37  CREATININE 4.74* 5.02*  --   --   --  6.32*  TROPONINI  --   --  3.40* 4.88* 4.74*  --     Estimated Creatinine Clearance: 9 mL/min (by C-G formula based on SCr of 6.32 mg/dL (H)).   Medications:  Infusions:  . heparin 850 Units/hr (05/12/16 1841)  . phenylephrine (NEO-SYNEPHRINE) Adult infusion 12 mcg/min (05/13/16 0244)    Assessment: Patient with heparin level at goal.  No heparin issues noted.  Goal of Therapy:  Heparin level 0.3-0.7 units/ml Monitor platelets by anticoagulation protocol: Yes   Plan:  Continue heparin drip at current rate Recheck level at 1000  Darlina GuysGrimsley Jr, WakemanJulian Crowford 05/13/2016,3:54 AM

## 2016-05-13 NOTE — Consult Note (Signed)
WOC Nurse ostomy consult note Stoma type/location: RLQ ileostomy Stomal assessment/size: 1/8 inches red, budded, round.  Visualized through intact pouching system. Peristomal assessment: Not seen today Treatment options for stomal/peristomal skin: None indicated.  Patient wears a convex ostomy pouch, 1-piece and it is intact. Output: brown liquid effluent. Ostomy pouching: 1pc. Convex pouch, patient uses his own from home.  He has several (5+ in a bag from home with him today).  He wears them for 5-7 days. Education provided: None today.  Patient given positive reinforcement for bringing supplies.  He is satisfied with supplier and with barrier strips he obtains from [employer] to augment his skin barrier.  Patinet needs assistance at this time with emptying pouch and this instruction is provided for Nursing via the Orders. Enrolled patient in [employer] Secure Start DC program: No WOC nursing team will not follow, but will remain available to this patient, the nursing and medical teams.  Please re-consult if needed. Thanks, Ladona MowLaurie Alexandre Lightsey, MSN, RN, GNP, Hans EdenCWOCN, CWON-AP, FAAN  Pager# [phone]

## 2016-05-13 NOTE — Care Management Note (Signed)
Case Management Note  Patient Details  Name: Jacob Fields MRN: [id number] Date of Birth: [birth year]  Subjective/Objective:      Sepsis with hypotension requiring iv pressors              Action/Plan:  TBD  Date:  May 13, 2016 Chart reviewed for concurrent status and case management needs. Will continue to follow patient progress. Discharge Planning: following for needs Expected discharge date: [id number] Marcelle SmilingRhonda Davis, BSN, [place]RN3, [place]   [phone]  Expected Discharge Date:                  Expected Discharge Plan:  Skilled Nursing Facility  In-House Referral:  Clinical Social Work  Discharge planning Services  CM Consult  Post Acute Care Choice:    Choice offered to:     DME Arranged:    DME Agency:     HH Arranged:    HH Agency:     Status of Service:  In process, will continue to follow  If discussed at Long Length of Stay Meetings, dates discussed:    Additional Comments:  Golda AcreDavis, Rhonda Lynn, RN 05/13/2016, 10:16 AM

## 2016-05-14 DIAGNOSIS — D631 Anemia in chronic kidney disease: Secondary | ICD-10-CM

## 2016-05-14 DIAGNOSIS — N185 Chronic kidney disease, stage 5: Secondary | ICD-10-CM

## 2016-05-14 LAB — URINE CULTURE: SPECIAL REQUESTS: NORMAL

## 2016-05-14 LAB — RENAL FUNCTION PANEL
ALBUMIN: 3.1 g/dL — AB (ref 3.5–5.0)
Anion gap: 10 (ref 5–15)
BUN: 49 mg/dL — ABNORMAL HIGH (ref 6–20)
CALCIUM: 8.8 mg/dL — AB (ref 8.9–10.3)
CHLORIDE: 107 mmol/L (ref 101–111)
CO2: 17 mmol/L — ABNORMAL LOW (ref 22–32)
CREATININE: 7.57 mg/dL — AB (ref 0.61–1.24)
GFR, EST AFRICAN AMERICAN: 7 mL/min — AB (ref >60)
GFR, EST NON AFRICAN AMERICAN: 6 mL/min — AB (ref >60)
Glucose, Bld: 110 mg/dL — ABNORMAL HIGH (ref 65–99)
PHOSPHORUS: 3.6 mg/dL (ref 2.5–4.6)
Potassium: 4.4 mmol/L (ref 3.5–5.1)
SODIUM: 134 mmol/L — AB (ref 135–145)

## 2016-05-14 LAB — CBC WITH DIFFERENTIAL/PLATELET
BASOS ABS: 0 10*3/uL (ref 0.0–0.1)
BASOS PCT: 0 %
EOS ABS: 0.1 10*3/uL (ref 0.0–0.7)
Eosinophils Relative: 1 %
HCT: 22.5 % — ABNORMAL LOW (ref 39.0–52.0)
HEMOGLOBIN: 7.9 g/dL — AB (ref 13.0–17.0)
Lymphocytes Relative: 13 %
Lymphs Abs: 0.6 10*3/uL — ABNORMAL LOW (ref 0.7–4.0)
MCH: 38.5 pg — ABNORMAL HIGH (ref 26.0–34.0)
MCHC: 35.1 g/dL (ref 30.0–36.0)
MCV: 109.8 fL — ABNORMAL HIGH (ref 78.0–100.0)
Monocytes Absolute: 0.6 10*3/uL (ref 0.1–1.0)
Monocytes Relative: 13 %
NEUTROS PCT: 73 %
Neutro Abs: 3.3 10*3/uL (ref 1.7–7.7)
PLATELETS: 77 10*3/uL — AB (ref 150–400)
RBC: 2.05 MIL/uL — AB (ref 4.22–5.81)
RDW: 14.7 % (ref 11.5–15.5)
WBC: 4.5 10*3/uL (ref 4.0–10.5)

## 2016-05-14 LAB — GLUCOSE, CAPILLARY
GLUCOSE-CAPILLARY: 97 mg/dL (ref 65–99)
Glucose-Capillary: 151 mg/dL — ABNORMAL HIGH (ref 65–99)
Glucose-Capillary: 97 mg/dL (ref 65–99)

## 2016-05-14 LAB — HEMOGLOBIN A1C
Hgb A1c MFr Bld: 5.4 % (ref 4.8–5.6)
Mean Plasma Glucose: 108 mg/dL

## 2016-05-14 LAB — MAGNESIUM: MAGNESIUM: 2 mg/dL (ref 1.7–2.4)

## 2016-05-14 NOTE — Progress Notes (Signed)
eLink Physician-Brief Progress Note Patient Name: [name] DOB: [birth year] MRN: [id number]   Date of Service  05/14/2016  HPI/Events of Note  ESRD on HD  eICU Interventions  Placed consult for nephro.      Intervention Category Minor Interventions: Communication with other healthcare providers and/or family  Shane Crutchradeep Anorah Trias 05/14/2016, 8:50 PM

## 2016-05-14 NOTE — Progress Notes (Signed)
Clarification of diagnosis: 1.  Chronic systolic and diastolic heart failure, LVEF 40%.  No clinical evidence of acute decompensation. 2.  End-stage renal disease on hemodialysis.  Patient anuric and no indication for IV diuretics. 3. NSTEMI due to demand ischemia but will need out patient work-up once medically stable. He will need coronary angiogram or at least consider out patient stress testing.  4. He needs to be evaluated for "Watchman device" for non valvular atrial fibrillation with contraindication to long term anticoagulation. I have advised him to discuss this further with [place] cardiologist in [place].  Yates DecampGANJI, Varun Jourdan, MD 05/13/2016, 7:23 PM [facility] [place] Pager: [phone] Office: [phone] If no answer: Cell:  [phone]

## 2016-05-14 NOTE — Progress Notes (Signed)
PULMONARY / CRITICAL CARE MEDICINE   Name: Adline MangoRobert M App MRN: [id number] DOB: 1941/02/19    ADMISSION DATE:  05/11/2016  CHIEF COMPLAINT:  Fever, chills.  HISTORY OF PRESENT ILLNESS:   Mr. [name] is a 75 y/o man with ESRD on HD who is visiting family in the area who presents [facility] with fever, chills, and sepsis. Please see H&P by Dr. Maryfrances Bunnellanford for complete details, but briefly, following HD today, he developed worsening fever and chills and presented to the ED with septic shock.  SUBJECTIVE: Feeling a little more short of breath   VITAL SIGNS: BP (!) 131/43   Pulse 98   Temp 98.9 F (37.2 C) (Oral)   Resp (!) 28   Ht 5\' 6"  (1.676 m)   Wt 154 lb 5.2 oz (70 kg)   SpO2 100%   BMI 24.91 kg/m   HEMODYNAMICS:    VENTILATOR SETTINGS:    INTAKE / OUTPUT: I/O last 3 completed shifts: In: 663.1 [I.V.:563.1; IV Piggyback:100] Out: 1830 [Urine:430; Stool:1400]  PHYSICAL EXAMINATION: General:  Awake. Alert. Laying in bed. no family at bedside.  Integument:  Warm & dry. No rash on exposed skin. Bandage in place over left upper extremity AV graft.  HEENT:  Moist mucus membranes. No scleral injection or icterus.  Cardiovascular:  Regular rate. No edema. No appreciable JVD.  Pulmonary:  Normal work of breathing on room air. Speaking in complete sentences. Crackles in bases some increased work of breathing  Abdomen: Soft. Normal bowel sounds. Nondistended. Grossly nontender. Neurological: Alert and oriented 4. Cranial nerves grossly intact.  LABS:  BMET  Recent Labs Lab 05/12/16 0340 05/13/16 0121 05/13/16 0145 05/14/16 0324  NA 138 135  --  134*  K 4.2 4.4  --  4.4  CL 107 106  --  107  CO2 22 20*  --  17*  BUN 25* 37*  --  49*  CREATININE 5.02* 6.32* 6.25* 7.57*  GLUCOSE 180* 114*  --  110*    Electrolytes  Recent Labs Lab 05/11/16 2105 05/12/16 0340 05/13/16 0121 05/14/16 0324  CALCIUM  --  8.7* 9.0 8.8*  MG 1.3*  --  2.0 2.0  PHOS  --   --  3.5 3.6     CBC  Recent Labs Lab 05/12/16 0340 05/13/16 0121 05/14/16 0324  WBC 14.3* 7.1 4.5  HGB 8.9* 8.8* 7.9*  HCT 25.8* 25.7* 22.5*  PLT 124* 89* 77*    Coag's  Recent Labs Lab 05/11/16 2058  INR 1.06    Sepsis Markers  Recent Labs Lab 05/11/16 2112 05/12/16 0003 05/12/16 0340  LATICACIDVEN 6.28* 3.2* 2.5*    ABG No results for input(s): PHART, PCO2ART, PO2ART in the last 168 hours.  Liver Enzymes  Recent Labs Lab 05/11/16 2058 05/12/16 0340 05/13/16 0121 05/14/16 0324  AST 38 45*  --   --   ALT 21 23  --   --   ALKPHOS 58 51  --   --   BILITOT 1.5* 1.0  --   --   ALBUMIN 4.1 3.3* 3.5 3.1*    Cardiac Enzymes  Recent Labs Lab 05/12/16 1315 05/12/16 1925 05/13/16 1010  TROPONINI 4.88* 4.74* 3.79*    Glucose  Recent Labs Lab 05/12/16 1826 05/12/16 2119 05/13/16 0727 05/13/16 1129 05/13/16 1640 05/13/16 1919  GLUCAP 143* 96 102* 211* 164* 93    Imaging No results found.   STUDIES:  EKG 11/25:  Atrial fibrillation & RBBB.  MICROBIOLOGY: Blood x2  05/11/16 >> MRSA PCR >>  ANTIBIOTICS: Vancomycin 11/25 >>11/28 Zosyn 11/25 >>  SIGNIFICANT EVENTS: 11/25 - Admit  LINES/TUBES: PIV x2 L UA fistula  ASSESSMENT / PLAN:   Resolved septic shock -->suspect urinary tract source from frequent urinary meatus dilation at home Plan:   Empiric Vancomycin & Zosyn Day #4; dc vanc  Awaiting culture results  NSTEMI Atrial Fibrillation - Newly diagnosed. H/O Carotid Artery Stenosis - S/P CEA. H/O HTN & Hyperlipidemia Plan:  Continuous telemetry monitoring Vitals per unit protocol Goal MAP >60  Continuing home Lipitor 80mg  Cards following  Added midodrine -->b-blocker may be difficult to add w/ hypotension at baseline  brilinta started 11/28  ESRD on HD (T/T/S) Plan   Trending electrolytes daily Replacing electrolytes as indicated Hold febuxostat for now Continuing home KPhos BID & Phoslo TID Ask renal to see   Dyspnea   -->r/t volume overload  Plan   Continuous pulse oximetry.  HD planned for 11/28  H/O Ulcerative Colitis - S/P Ostomy. Plan:   Wound Care/Ostomy Consult Heart Healthy Diet    Anemia - No signs of active bleeding. Suspect element of hemodilution  Thrombocytopenia - Mild. Heparin gtt stopped.  Leukocytosis - Likely due to sepsis. Plan:  Trending cell counts daily w/ CBC Heparin [place] q8hr-->stop Place SCDs   H/O Stroke Plan   Continue Lipitor Monitor closely.  FAMILY  - Updates: Patient updated at bedside 11/26 by Dr. Jamison NeighborNestor.  - Inter-disciplinary family meet or Palliative Care meeting due by:  05/18/16  TODAY'S SUMMARY:  75 y.o. man with end-stage renal disease on hemodialysis admitted with septic shock on 11/25 following HD. Source likely d/t the fact that he dilates his ureteral meatus daily.  Course has been c/b NSTEMI. Cards following. He has baseline hypotension "at times they have to keep me at dialysis until they get a BP they like". We added low dose midodrine. Plan for today: narrow abx, transfer to [facility] for [facility], transfer to SDU setting, start brilenta. His BP may be an obstacle to starting BB. [facility] will assume care effective 11/29  Simonne MartinetPeter E Christina Waldrop ACNP-BC [facility] Pulmonary/Critical Care Pager # [phone] OR # [phone] if no answer  My critical care time 30 minutes

## 2016-05-15 LAB — CBC
HCT: 28.7 % — ABNORMAL LOW (ref 39.0–52.0)
Hemoglobin: 10 g/dL — ABNORMAL LOW (ref 13.0–17.0)
MCH: 37.7 pg — ABNORMAL HIGH (ref 26.0–34.0)
MCHC: 34.8 g/dL (ref 30.0–36.0)
MCV: 108.3 fL — ABNORMAL HIGH (ref 78.0–100.0)
Platelets: 101 10*3/uL — ABNORMAL LOW (ref 150–400)
RBC: 2.65 MIL/uL — ABNORMAL LOW (ref 4.22–5.81)
RDW: 14.4 % (ref 11.5–15.5)
WBC: 6 10*3/uL (ref 4.0–10.5)

## 2016-05-15 LAB — IRON AND TIBC
IRON: 83 ug/dL (ref 45–182)
SATURATION RATIOS: 35 % (ref 17.9–39.5)
TIBC: 238 ug/dL — ABNORMAL LOW (ref 250–450)
UIBC: 155 ug/dL

## 2016-05-15 LAB — RENAL FUNCTION PANEL
Albumin: 3 g/dL — ABNORMAL LOW (ref 3.5–5.0)
Anion gap: 11 (ref 5–15)
BUN: 53 mg/dL — ABNORMAL HIGH (ref 6–20)
CO2: 15 mmol/L — ABNORMAL LOW (ref 22–32)
Calcium: 9.7 mg/dL (ref 8.9–10.3)
Chloride: 109 mmol/L (ref 101–111)
Creatinine, Ser: 8.62 mg/dL — ABNORMAL HIGH (ref 0.61–1.24)
GFR calc Af Amer: 6 mL/min — ABNORMAL LOW (ref >60)
GFR calc non Af Amer: 5 mL/min — ABNORMAL LOW (ref >60)
Glucose, Bld: 172 mg/dL — ABNORMAL HIGH (ref 65–99)
Phosphorus: 4.3 mg/dL (ref 2.5–4.6)
Potassium: 4.9 mmol/L (ref 3.5–5.1)
Sodium: 135 mmol/L (ref 135–145)

## 2016-05-15 LAB — GLUCOSE, CAPILLARY
Glucose-Capillary: 102 mg/dL — ABNORMAL HIGH (ref 65–99)
Glucose-Capillary: 95 mg/dL (ref 65–99)
Glucose-Capillary: 76 mg/dL (ref 65–99)

## 2016-05-15 MED ORDER — RENA-VITE PO TABS
1.0000 | ORAL_TABLET | Freq: Every day | ORAL | Status: DC
  Administered 2016-05-15 – 2016-05-22 (×8): 1 via ORAL
  Filled 2016-05-15 (×7): qty 1

## 2016-05-15 MED ORDER — ALTEPLASE 2 MG IJ SOLR: 2.0000 mg | Freq: Once | INTRAMUSCULAR | Status: DC | PRN

## 2016-05-15 MED ORDER — HEPARIN SODIUM (PORCINE) 1000 UNIT/ML DIALYSIS: 1000.0000 [IU] | INTRAMUSCULAR | Status: DC | PRN

## 2016-05-15 MED ORDER — PENTAFLUOROPROP-TETRAFLUOROETH EX AERO: 1.0000 "application " | INHALATION_SPRAY | CUTANEOUS | Status: DC | PRN

## 2016-05-15 MED ORDER — DARBEPOETIN ALFA 200 MCG/0.4ML IJ SOSY: 200.0000 ug | PREFILLED_SYRINGE | INTRAMUSCULAR | Status: DC

## 2016-05-15 MED ORDER — LIDOCAINE-PRILOCAINE 2.5-2.5 % EX CREA: 1.0000 "application " | TOPICAL_CREAM | CUTANEOUS | Status: DC | PRN

## 2016-05-15 MED ORDER — SODIUM CHLORIDE 0.9 % IV SOLN: 100.0000 mL | INTRAVENOUS | Status: DC | PRN

## 2016-05-15 MED ORDER — LIDOCAINE HCL (PF) 1 % IJ SOLN: 5.0000 mL | INTRAMUSCULAR | Status: DC | PRN

## 2016-05-15 MED ORDER — SODIUM CHLORIDE 0.9 % IV SOLN
100.0000 mL | INTRAVENOUS | Status: DC | PRN
Start: 2016-05-15 — End: 2016-05-16

## 2016-05-15 NOTE — Consult Note (Signed)
[facility] Renal Consultation Note    Indication for Consultation:  Management of ESRD/hemodialysis; anemia, hypertension/volume and secondary hyperparathyroidism PCP:  HPI: Jacob Fields is a 75 y.o. male with ESRD, hx stroke, kidney stones, atrophic kidney , hx ulcerative colitis with ileostomy on HD TTS in [place] approximately 2 years.  He dialyzed Saturday in [place] because he had been visiting his daughter in [place]. Marland Kitchen. He thought he was at a [facility] unit but he was not at our unit there.  His history today does not match the admitting H and P. He said he started having chills and fever Sunday and was brought to the ED. Per admission H and P he had altered mental status post HD with weakness and fever. EMS was called and he was [facility]. CXR was negative for PNA. HE was pancultured. WBC was 9.5 bumped up to 14 K and now back down.. BP 80/50.  Empiric Vanc and Zosyn were  Started. He denies N, V, D, cough but reports  he is somewhat SOB. He also does self urethral dilatation daily and urinates a small amount. He is followed by urology.C/o SOB  Hx UC , with ileostomy.  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  Past Medical History:  Diagnosis Date  . Gout   . Hypertension   . Renal disorder   . Stroke [facility](HCC)    Past Surgical History:  Procedure Laterality Date  . CAROTID ENDARTERECTOMY    . CHOLECYSTECTOMY    . COLON SURGERY    . TONSILLECTOMY     Family History  Problem Relation Age of Onset  . Stroke Mother   . Stroke Brother   . CAD Brother    Social History:  reports that he has never smoked. He has never used smokeless tobacco. He reports that he does not drink alcohol or use drugs. Allergies  Allergen Reactions  . Plavix [Clopidogrel]     sores   Prior to Admission medications   Medication Sig Start Date End Date Taking? Authorizing Provider  amitriptyline (ELAVIL) 25 MG tablet Take 75 mg by mouth at bedtime. 12/29/15   Yes Historical Provider, MD  atorvastatin (LIPITOR) 80 MG tablet Take 80 mg by mouth daily.   Yes Historical Provider, MD  B Complex-C-Folic Acid (RENA-VITE PO) Take 1 tablet by mouth daily.   Yes Historical Provider, MD  calcium acetate (PHOSLO) 667 MG capsule Take 1,334 mg by mouth 3 (three) times daily with meals.    Yes Historical Provider, MD  colchicine 0.6 MG tablet Take 1 tablet by mouth daily as needed.   Yes Historical Provider, MD  dipyridamole-aspirin (AGGRENOX) 200-25 MG 12hr capsule Take 1 capsule by mouth 2 (two) times daily.   Yes Historical Provider, MD  Febuxostat (ULORIC PO) Take 20 mg by mouth daily.    Yes Historical Provider, MD  fluocinonide cream (LIDEX) 0.05 % Apply 1 application topically daily as needed. 04/18/16  Yes Historical Provider, MD   Current Facility-Administered Medications  Medication Dose Route Frequency Provider Last Rate Last Dose  . 0.9 %  sodium chloride infusion  250 mL Intravenous PRN Jamie KatoAaron Trimble, MD      . acetaminophen (TYLENOL) tablet 650 mg  650 mg Oral Q6H PRN Alberteen Samhristopher P Danford, MD       Or  . acetaminophen (TYLENOL) suppository 650 mg  650 mg Rectal Q6H PRN Alberteen Samhristopher P Danford, MD      . aspirin chewable tablet 81  mg  81 mg Oral Daily [name], MD   81 mg at 05/15/16 0955  . atorvastatin (LIPITOR) tablet 80 mg  80 mg Oral Daily Alberteen Sam, MD   80 mg at 05/15/16 0954  . calcium acetate (PHOSLO) capsule 1,334 mg  1,334 mg Oral TID WC Nelda Bucks, MD   1,334 mg at 05/15/16 1213  . midodrine (PROAMATINE) tablet 2.5 mg  2.5 mg Oral TID WC Simonne Martinet, NP   2.5 mg at 05/15/16 1213  . ondansetron (ZOFRAN) tablet 4 mg  4 mg Oral Q6H PRN Alberteen Sam, MD       Or  . ondansetron (ZOFRAN) injection 4 mg  4 mg Intravenous Q6H PRN Alberteen Sam, MD      . piperacillin-tazobactam (ZOSYN) IVPB 3.375 g  3.375 g Intravenous Q12H Nelda Bucks, MD   3.375 g at 05/15/16 0955  . sodium chloride flush (NS) 0.9  % injection 3 mL  3 mL Intravenous Q12H Alberteen Sam, MD   3 mL at 05/15/16 0955  . ticagrelor (BRILINTA) tablet 90 mg  90 mg Oral BID [name], MD   90 mg at 05/15/16 0954   Labs: Basic Metabolic Panel:  Recent Labs Lab 05/12/16 0340 05/13/16 0121 05/13/16 0145 05/14/16 0324  NA 138 135  --  134*  K 4.2 4.4  --  4.4  CL 107 106  --  107  CO2 22 20*  --  17*  GLUCOSE 180* 114*  --  110*  BUN 25* 37*  --  49*  CREATININE 5.02* 6.32* 6.25* 7.57*  CALCIUM 8.7* 9.0  --  8.8*  PHOS  --  3.5  --  3.6   Liver Function Tests:  Recent Labs Lab 05/11/16 2058 05/12/16 0340 05/13/16 0121 05/14/16 0324  AST 38 45*  --   --   ALT 21 23  --   --   ALKPHOS 58 51  --   --   BILITOT 1.5* 1.0  --   --   PROT 7.6 6.3*  --   --   ALBUMIN 4.1 3.3* 3.5 3.1*   No results for input(s): LIPASE, AMYLASE in the last 168 hours. No results for input(s): AMMONIA in the last 168 hours. CBC:  Recent Labs Lab 05/11/16 2058 05/12/16 0340 05/13/16 0121 05/14/16 0324  WBC 9.5 14.3* 7.1 4.5  NEUTROABS 8.9*  --  6.0 3.3  HGB 10.7* 8.9* 8.8* 7.9*  HCT 29.9* 25.8* 25.7* 22.5*  MCV 107.6* 111.2* 108.9* 109.8*  PLT 157 124* 89* 77*   Cardiac Enzymes:  Recent Labs Lab 05/11/16 2057 05/12/16 0725 05/12/16 1315 05/12/16 1925 05/13/16 1010  TROPONINI 0.42* 3.40* 4.88* 4.74* 3.79*   CBG:  Recent Labs Lab 05/13/16 1919 05/14/16 0948 05/14/16 1157 05/14/16 2125 05/15/16 0802  GLUCAP 93 97 151* 97 102*   ROS: As per HPI poor historian  Physical Exam: Vitals:   05/15/16 0400 05/15/16 0424 05/15/16 0700 05/15/16 0800  BP: (!) 123/52 (!) 123/52 131/79 131/79  Pulse: 64 76 70 62  Resp: 19 (!) 22 15 (!) 21  Temp:  98.2 F (36.8 C) 98.4 F (36.9 C)   TempSrc:  Oral Oral   SpO2: 100% 98% 100% 100%  Weight:  70.2 kg (154 lb 11.2 oz)    Height:         General: elderly frail weak  WM Head: NCAT sclera not icteric MMM Fundi benigh Neck: Supple. PCL Lungs: grossly  bilaterally without wheezes, rales, or rhonchi. Breathing is unlabored. Rales in based Heart: RRR with S1 S2. Gr 2/6 SEM Abdomen: right LQ ileostomy no SP mass,pos BS Lower extremities:without edema or ischemic changes, no open wounds  Neuro: Some memory issues.e.g. Names of meds. Moves all extremities spontaneously. Psych:  Responds to questions appropriately with a normal affect. Dialysis Access: left upper AVF + bruit nontender/no erythema  Dialysis Orders:  [place] TTS - being called for orders runs 4 hr - doesn't know EDW  Assessment/Plan: 1.  Sepsis - urine culture - mult species - blood cultures no growth; high risk for UTI given self uretheral dilatation - he cleans the instrument he uses with something in a "green bottle." ? If could be primary renal with hx stones 2.  ESRD -  TTS - last HD Saturday - plan 3 hr HD today due to pt load and again Thursday to get on schedule vol xs, acidemic contrib to SOB 3.  BP /volume  - BP improved on midodrine and volume up some since no HD since Saturday- avoid BP drop 4.  Anemia  - 7.9 - obtain outpt orders for ESA Use Aranesp 5.  Metabolic bone disease -  Takes 2 phos ac and 1 with meals Ca/P ok 6.  Nutrition - change to renal diet alb 3.1 - add supplements 7. NSTEMI + troponins - thought secondary to demand ischemia  8. Hx CVA 9. DNR  Sheffield SliderMartha B Bergman, PA-C [facility] Beeper [phone] 05/15/2016, 12:17 PM I have seen and examined this patient and agree with the plan of care seen, eval, examined.  Counseled patient .  Kekoa Fyock L 05/15/2016, 2:30 PM

## 2016-05-15 NOTE — Progress Notes (Signed)
Pt off unit in dialysis at this time.

## 2016-05-15 NOTE — Procedures (Signed)
Patient seen and examined on hemodialysis.  No complaints.  Tolerating treatment well. QB 400, UF goal 2l.  Treatment adjusted as needed.  Bufford ButtnerElizabeth Demia Viera MD [facility] Pager [phone] 9:11 PM

## 2016-05-15 NOTE — Care Management Important Message (Signed)
Important Message  Patient Details  Name: Jacob Fields MRN: [id number] Date of Birth: 01/24/1941   Medicare Important Message Given:  Yes    Kyla BalzarineShealy, Yailin Biederman Abena 05/15/2016, 11:05 AM

## 2016-05-15 NOTE — Progress Notes (Signed)
Pharmacy Antibiotic Note  Jacob MangoRobert M Zartman is a 75 y.o. male admitted on 05/11/2016 with possible sepsis.  Pharmacy has been consulted for Zosyn dosing vancomycin d/c 11/28). -WBC= 4.5, afebrile, SCr= 7.57 (ESRD on HD), cultures NGTD  Plan: Continue Zosyn 3.375g IV Q12hr  -Will follow cultures and clinical progress   Height: 5\' 6"  (167.6 cm) Weight: 154 lb 11.2 oz (70.2 kg) IBW/kg (Calculated) : 63.8  Temp (24hrs), Avg:98 F (36.7 C), Min:97.6 F (36.4 C), Max:98.4 F (36.9 C)   Recent Labs Lab 05/11/16 2058 05/11/16 2112 05/12/16 0003 05/12/16 0340 05/13/16 0121 05/13/16 0145 05/14/16 0324  WBC 9.5  --   --  14.3* 7.1  --  4.5  CREATININE 4.74*  --   --  5.02* 6.32* 6.25* 7.57*  LATICACIDVEN  --  6.28* 3.2* 2.5*  --   --   --     Estimated Creatinine Clearance: 7.5 mL/min (by C-G formula based on SCr of 7.57 mg/dL (H)).    Allergies  Allergen Reactions  . Plavix [Clopidogrel]     sores    Antimicrobials this admission:  11/25 Vancomycin  >> 11/28 11/25 Zosyn  >>   Dose adjustments this admission:    Microbiology results:  11/25 BCx x2: ngtd 11/26 JXB:JYNWGucx:multi species, need recollection 11/26 MRSA PCR (-)  Thank you for allowing pharmacy to be a part of this patient's care.  Harland GermanAndrew Ecko Beasley, Pharm D 05/15/2016 9:06 AM

## 2016-05-15 NOTE — Progress Notes (Addendum)
Pt transf [facility] for [facility]. Left pt 30day free brilinta card. Lives alone in [place]. Visiting family. Prev cm states needs snf. sw ref has been placed.RX COVERAGE WITH [employer]   BRILINTA 90 MG  BID  30/ 60 TAB   COVER- YES  CO-PAY- $ 44.00  TIER- 3 DRUG  PRIOR APPROVAL -NO  PHARMACY : [facility]

## 2016-05-15 NOTE — Progress Notes (Signed)
Patient transported to dialysis, A/OX3 and in NAD.

## 2016-05-15 NOTE — Progress Notes (Signed)
PROGRESS NOTE    Jacob MangoRobert M Rom  WUJ:[id number] DOB: 1941-02-17 DOA: 05/11/2016 PCP: No primary care provider on file.   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  The patient lives alone in [place], [place]. He is up here to visit his daughter for the holiday.  Over the last week or more, son notes his father has complained of malaise.    Then today, the patient had his Friday dialysis treatment as a guest at a center in [place]. Afterwards, he was weak, febrile and had decreased responsiveness, so family called 9-1-1.  EMS found him altered, febrile, with chills and administered acetaminophen en route.   Subjective: 11/29  A/O 4, NAD. States last HD session on Saturday in [place]. Positive SOB, negative CP. States never has pedal edema when he misses HD sessions.    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    -Sepsis bundle utilized: -Patient defervesced. Continue Zosyn -Blood and urine cultures NGTD  ESRD on HD T/Th/S:  -Continue Phoslo  New atrial fibrillation(CHADS2Vasc 4) -Currently rate improved to 100s with fluids. -Fluid resuscitate and treat underlying sepsis -Monitor on telemetry -Per cardiology Dr. Yates DecampJay Ganji  needs to be evaluated for "Watchman device" for non valvular atrial fibrillation with contraindication to long term anticoagulation. I have advised him to discuss this further with his cardiologist in [place], [place]. Unsure of contraindication for anticoagulation will contact cardiology on 11/30 to discuss case   NSTEMI/Chronic Systolic and Diastolic CHF   Gout:  -Hold  febuxostat until more medically stable  History of stroke:  -Continue aspirin 325, statin  Ulcerative colitis:  -WOC consult  Other medications:  -Hold amitriptyline until more medically stable   DVT prophylaxis: SCD Code Status: DO NOT RESUSCITATE Family Communication: None Disposition Plan: ?   Consultants:  Dr. Delano Metzobert [facility] Dr. Yates DecampJay Ganji cardiology  Procedures/Significant Events:  11/26 Echocardiogram: LVEF = 40%. Hypokinesis of the basal-midinferolateral and inferior myocardium.  -(grade 2 diastolic dysfunction). - Aortic valve: mild to moderate stenosis.     VENTILATOR SETTINGS:    Cultures 11/25 blood 2 NGTD 11/26 MRSA by PCR negative 11/26 urine positive multiple species   Antimicrobials: Zosyn 11/26>> Vancomycin 11/25>> 11/28   Devices    LINES / TUBES:      Continuous Infusions:   Objective: Vitals:   05/15/16 0400 05/15/16 0424 05/15/16 0700 05/15/16 0800  BP: (!) 123/52 (!) 123/52 131/79 131/79  Pulse: 64 76 70 62  Resp: 19 (!) 22 15 (!) 21  Temp:  98.2 F (36.8 C) 98.4 F (36.9 C)   TempSrc:  Oral Oral   SpO2: 100% 98% 100% 100%  Weight:  70.2 kg (154 lb 11.2 oz)    Height:        Intake/Output Summary (Last 24 hours) at 05/15/16 1215 Last data filed at 05/15/16 0955  Gross per 24 hour  Intake              603 ml  Output             2125 ml  Net            -  1522 ml   Filed Weights   05/13/16 0600 05/14/16 0345 05/15/16 0424  Weight: 70 kg (154 lb 5.2 oz) 70 kg (154 lb 5.2 oz) 70.2 kg (154 lb 11.2 oz)    Examination:  General: A/O 4, positive acute respiratory distress Eyes: negative scleral hemorrhage, negative anisocoria, negative icterus ENT: Negative Runny nose, negative gingival bleeding, Neck:  Negative scars, masses, torticollis, lymphadenopathy, JVD Lungs: Clear to auscultation bilaterally without wheezes or crackles Cardiovascular: Regular rate and rhythm without murmur gallop or rub normal  S1 and S2 Abdomen: negative abdominal pain, nondistended, positive soft, bowel sounds, no rebound, no ascites, no appreciable mass Extremities: No significant cyanosis, clubbing, or edema bilateral lower extremities Skin: Negative rashes, lesions, ulcers Psychiatric:  Negative depression, negative anxiety, negative fatigue, negative mania  Central nervous system:  Cranial nerves II through XII intact, tongue/uvula midline, all extremities muscle strength 5/5, sensation intact throughout, negative dysarthria, negative expressive aphasia, negative receptive aphasia.  .     Data Reviewed: Care during the described time interval was provided by me .  I have reviewed this patient's available data, including medical history, events of note, physical examination, and all test results as part of my evaluation. I have personally reviewed and interpreted all radiology studies.  CBC:  Recent Labs Lab 05/11/16 2058 05/12/16 0340 05/13/16 0121 05/14/16 0324  WBC 9.5 14.3* 7.1 4.5  NEUTROABS 8.9*  --  6.0 3.3  HGB 10.7* 8.9* 8.8* 7.9*  HCT 29.9* 25.8* 25.7* 22.5*  MCV 107.6* 111.2* 108.9* 109.8*  PLT 157 124* 89* 77*   Basic Metabolic Panel:  Recent Labs Lab 05/11/16 2058 05/11/16 2105 05/12/16 0340 05/13/16 0121 05/13/16 0145 05/14/16 0324  NA 138  --  138 135  --  134*  K 3.3*  --  4.2 4.4  --  4.4  CL 101  --  107 106  --  107  CO2 21*  --  22 20*  --  17*  GLUCOSE 132*  --  180* 114*  --  110*  BUN 21*  --  25* 37*  --  49*  CREATININE 4.74*  --  5.02* 6.32* 6.25* 7.57*  CALCIUM 9.7  --  8.7* 9.0  --  8.8*  MG  --  1.3*  --  2.0  --  2.0  PHOS  --   --   --  3.5  --  3.6   GFR: Estimated Creatinine Clearance: 7.5 mL/min (by C-G formula based on SCr of 7.57 mg/dL (H)). Liver Function Tests:  Recent Labs Lab 05/11/16 2058 05/12/16 0340 05/13/16 0121 05/14/16 0324  AST 38 45*  --   --   ALT 21 23  --   --   ALKPHOS 58 51  --   --   BILITOT 1.5* 1.0  --   --   PROT  7.6 6.3*  --   --   ALBUMIN 4.1 3.3* 3.5 3.1*   No results for input(s): LIPASE, AMYLASE in the last 168 hours. No results for input(s): AMMONIA in the last 168 hours. Coagulation Profile:  Recent Labs Lab 05/11/16 2058  INR 1.06   Cardiac Enzymes:  Recent Labs Lab 05/11/16 2057 05/12/16 0725 05/12/16 1315 05/12/16 1925 05/13/16 1010  TROPONINI 0.42* 3.40* 4.88* 4.74* 3.79*   BNP (last 3 results) No results for input(s): PROBNP in the last 8760 hours. HbA1C:  Recent Labs  05/13/16 0145  HGBA1C 5.4   CBG:  Recent Labs Lab 05/13/16 1919 05/14/16 0948 05/14/16  1157 05/14/16 2125 05/15/16 0802  GLUCAP 93 97 151* 97 102*   Lipid Profile: No results for input(s): CHOL, HDL, LDLCALC, TRIG, CHOLHDL, LDLDIRECT in the last 72 hours. Thyroid Function Tests: No results for input(s): TSH, T4TOTAL, FREET4, T3FREE, THYROIDAB in the last 72 hours. Anemia Panel: No results for input(s): VITAMINB12, FOLATE, FERRITIN, TIBC, IRON, RETICCTPCT in the last 72 hours. Urine analysis:    Component Value Date/Time   COLORURINE YELLOW 06/12/2013 1807   APPEARANCEUR CLOUDY (A) 06/12/2013 1807   LABSPEC 1.010 06/12/2013 1807   PHURINE 5.5 06/12/2013 1807   GLUCOSEU NEGATIVE 06/12/2013 1807   HGBUR MODERATE (A) 06/12/2013 1807   BILIRUBINUR NEGATIVE 06/12/2013 1807   KETONESUR NEGATIVE 06/12/2013 1807   PROTEINUR 100 (A) 06/12/2013 1807   UROBILINOGEN 0.2 06/12/2013 1807   NITRITE NEGATIVE 06/12/2013 1807   LEUKOCYTESUR LARGE (A) 06/12/2013 1807   Sepsis Labs: @LABRCNTIP (procalcitonin:4,lacticidven:4)  ) Recent Results (from the past 240 hour(s))  Culture, blood (Routine x 2)     Status: None (Preliminary result)   Collection Time: 05/11/16  8:58 PM  Result Value Ref Range Status   Specimen Description BLOOD RIGHT HAND  Final   Special Requests BOTTLES DRAWN AEROBIC AND ANAEROBIC 5CC  Final   Culture   Final    NO GROWTH 2 DAYS Performed at [facility]     Report Status PENDING  Incomplete  Culture, blood (Routine x 2)     Status: None (Preliminary result)   Collection Time: 05/11/16  8:58 PM  Result Value Ref Range Status   Specimen Description BLOOD RIGHT FOREARM  Final   Special Requests IN PEDIATRIC BOTTLE 4 CC  Final   Culture   Final    NO GROWTH 2 DAYS Performed at [facility]    Report Status PENDING  Incomplete  MRSA PCR Screening     Status: None   Collection Time: 05/12/16 11:05 AM  Result Value Ref Range Status   MRSA by PCR NEGATIVE NEGATIVE Final    Comment:        The GeneXpert MRSA Assay (FDA approved for NASAL specimens only), is one component of a comprehensive MRSA colonization surveillance program. It is not intended to diagnose MRSA infection nor to guide or monitor treatment for MRSA infections.   Culture, Urine     Status: Abnormal   Collection Time: 05/12/16  2:55 PM  Result Value Ref Range Status   Specimen Description URINE, CLEAN CATCH  Final   Special Requests Normal  Final   Culture MULTIPLE SPECIES PRESENT, SUGGEST RECOLLECTION (A)  Final   Report Status 05/14/2016 FINAL  Final         Radiology Studies: No results found.      Scheduled Meds: . aspirin  81 mg Oral Daily  . atorvastatin  80 mg Oral Daily  . calcium acetate  1,334 mg Oral TID WC  . midodrine  2.5 mg Oral TID WC  . piperacillin-tazobactam (ZOSYN)  IV  3.375 g Intravenous Q12H  . sodium chloride flush  3 mL Intravenous Q12H  . ticagrelor  90 mg Oral BID   Continuous Infusions:   LOS: 4 days    Time spent: 40 minutes    Deroy Noah, Roselind Messier, MD [facility] Pager [phone]   If 7PM-7AM, please contact night-coverage [email or web address] Password [email or web address] 05/15/2016, 12:15 PM

## 2016-05-16 DIAGNOSIS — I4891 Unspecified atrial fibrillation: Secondary | ICD-10-CM

## 2016-05-16 DIAGNOSIS — I5042 Chronic combined systolic (congestive) and diastolic (congestive) heart failure: Secondary | ICD-10-CM

## 2016-05-16 DIAGNOSIS — K5100 Ulcerative (chronic) pancolitis without complications: Secondary | ICD-10-CM

## 2016-05-16 DIAGNOSIS — I214 Non-ST elevation (NSTEMI) myocardial infarction: Secondary | ICD-10-CM

## 2016-05-16 LAB — CBC WITH DIFFERENTIAL/PLATELET
BASOS ABS: 0 10*3/uL (ref 0.0–0.1)
Basophils Relative: 0 %
EOS PCT: 1 %
Eosinophils Absolute: 0.1 10*3/uL (ref 0.0–0.7)
HCT: 26.5 % — ABNORMAL LOW (ref 39.0–52.0)
HEMOGLOBIN: 9.4 g/dL — AB (ref 13.0–17.0)
LYMPHS ABS: 0.6 10*3/uL — AB (ref 0.7–4.0)
LYMPHS PCT: 9 %
MCH: 37.5 pg — AB (ref 26.0–34.0)
MCHC: 35.5 g/dL (ref 30.0–36.0)
MCV: 105.6 fL — AB (ref 78.0–100.0)
Monocytes Absolute: 0.7 10*3/uL (ref 0.1–1.0)
Monocytes Relative: 10 %
NEUTROS ABS: 5.1 10*3/uL (ref 1.7–7.7)
NEUTROS PCT: 79 %
PLATELETS: 103 10*3/uL — AB (ref 150–400)
RBC: 2.51 MIL/uL — AB (ref 4.22–5.81)
RDW: 13.9 % (ref 11.5–15.5)
WBC: 6.5 10*3/uL (ref 4.0–10.5)

## 2016-05-16 LAB — RENAL FUNCTION PANEL
ALBUMIN: 2.6 g/dL — AB (ref 3.5–5.0)
ANION GAP: 16 — AB (ref 5–15)
BUN: 20 mg/dL (ref 6–20)
CHLORIDE: 97 mmol/L — AB (ref 101–111)
CO2: 26 mmol/L (ref 22–32)
Calcium: 8.4 mg/dL — ABNORMAL LOW (ref 8.9–10.3)
Creatinine, Ser: 4.39 mg/dL — ABNORMAL HIGH (ref 0.61–1.24)
GFR, EST AFRICAN AMERICAN: 14 mL/min — AB (ref >60)
GFR, EST NON AFRICAN AMERICAN: 12 mL/min — AB (ref >60)
Glucose, Bld: 147 mg/dL — ABNORMAL HIGH (ref 65–99)
PHOSPHORUS: 3 mg/dL (ref 2.5–4.6)
POTASSIUM: 3.3 mmol/L — AB (ref 3.5–5.1)
Sodium: 139 mmol/L (ref 135–145)

## 2016-05-16 LAB — PARATHYROID HORMONE, INTACT (NO CA): PTH: 52 pg/mL (ref 15–65)

## 2016-05-16 LAB — HEPATITIS B SURFACE ANTIGEN: Hepatitis B Surface Ag: NEGATIVE

## 2016-05-16 LAB — MAGNESIUM: MAGNESIUM: 1.9 mg/dL (ref 1.7–2.4)

## 2016-05-16 LAB — GLUCOSE, CAPILLARY
GLUCOSE-CAPILLARY: 131 mg/dL — AB (ref 65–99)
GLUCOSE-CAPILLARY: 114 mg/dL — AB (ref 65–99)

## 2016-05-16 MED ORDER — FEBUXOSTAT 40 MG PO TABS
20.0000 mg | ORAL_TABLET | Freq: Every day | ORAL | Status: DC
  Administered 2016-05-17 – 2016-05-23 (×5): 20 mg via ORAL
  Filled 2016-05-16 (×7): qty 1

## 2016-05-16 NOTE — Progress Notes (Signed)
Subjective: Interval History: has no complaint, breathing better, feels better.  Objective: Vital signs in last 24 hours: Temp:  [97.6 F (36.4 C)-98.3 F (36.8 C)] 97.6 F (36.4 C) (11/30 1213) Pulse Rate:  [55-92] 78 (11/30 1213) Resp:  [11-24] 13 (11/30 1213) BP: (72-150)/(45-87) 96/59 (11/30 1213) SpO2:  [97 %-100 %] 100 % (11/30 1213) Weight:  [66.7 kg (147 lb 0.8 oz)-68.8 kg (151 lb 11.2 oz)] 66.7 kg (147 lb 0.8 oz) (11/30 0807) Weight change: -1.361 kg (-3 lb)  Intake/Output from previous day: 11/29 0701 - 11/30 0700 In: 463 [P.O.:360; I.V.:3; IV Piggyback:100] Out: 3110 [Urine:335; Stool:1075] Intake/Output this shift: Total I/O In: -  Out: 767 [Other:517; Stool:250]  General appearance: alert, cooperative, no distress and pale Resp: clear to auscultation bilaterally Cardio: S1, S2 normal and systolic murmur: systolic ejection 2/6, decrescendo at 2nd left intercostal space GI: soft, non-tender; bowel sounds normal; no masses,  no organomegaly Extremities: avf LUA   Lab Results:  Recent Labs  05/15/16 1811 05/16/16 0326  WBC 6.0 6.5  HGB 10.0* 9.4*  HCT 28.7* 26.5*  PLT 101* 103*   BMET:  Recent Labs  05/15/16 1811 05/16/16 0326  NA 135 139  K 4.9 3.3*  CL 109 97*  CO2 15* 26  GLUCOSE 172* 147*  BUN 53* 20  CREATININE 8.62* 4.39*  CALCIUM 9.7 8.4*    Recent Labs  05/15/16 1811  PTH 52   Iron Studies:  Recent Labs  05/15/16 1811  IRON 83  TIBC 238*    Studies/Results: No results found.  I have reviewed the patient's current medications.  Assessment/Plan: 1 ESRD HD today ,on sched now.  Vol better. 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

## 2016-05-16 NOTE — Progress Notes (Signed)
Patient transported back from dialysis.

## 2016-05-16 NOTE — Progress Notes (Signed)
Pt confused, oriented to self only. VS stable, pupils equal and reactive, no other neuro symptoms noted on assessment. MD paged and family called with update.  Daughter said he was intermittently confused this afternoon, however he sounds more confused now.  Will continue to monitor closely.

## 2016-05-16 NOTE — Progress Notes (Signed)
PROGRESS NOTE    Jacob Fields  ZOX:[id number] DOB: [birth year] DOA: 05/11/2016 PCP: No primary care provider on file.   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  The patient lives alone in [place], [place]. He is up here to visit his daughter for the holiday.  Over the last week or more, son notes his father has complained of malaise.    Then today, the patient had his Friday dialysis treatment as a guest at a center in [place]. Afterwards, he was weak, febrile and had decreased responsiveness, so family called 9-1-1.  EMS found him altered, febrile, with chills and administered acetaminophen en route.   Subjective: 11/30  A/O 4, NAD. States last HD session on Saturday in [place]. Positive SOB, negative CP. States never has pedal edema when he misses HD sessions. Patient may be be moving residents up to [place] with his children. 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 -Blood and urine cultures NGTD  ESRD on HD T/Th/S:  -Continue Phoslo  New Atrial Fibrillation(CHADS2Vasc 4) -Currently rate controlled.. -Per cardiology Dr. [name]  needs to be evaluated for "Watchman device" for non valvular atrial fibrillation with contraindication to long term anticoagulation. I have advised him to discuss this further with his cardiologist in [place], [place].  -Patient and family discussing tonight if more appropriate to move patient to [place] area. Will let me know in the a.m. and if patient  decides to stay will need to reconsult Dr. [name] for continued workup.  NSTEMI/Chronic Systolic and Diastolic CHF  -Per patient and family has had a workup in the past and was informed needed a CABG but would not survive surgery. -Children to obtain cardiology records   Gout:  -Uloric 20 mg daily  History of stroke:  -Continue aspirin 325, statin  Ulcerative colitis:  -WOC consult  Other medications:  -Hold Amitriptyline 75 mg QHS   SDH -Occurred~2015 therefore not appropriate for long-term anticoagulation   DVT prophylaxis: SCD Code Status: DO NOT RESUSCITATE Family Communication: None Disposition Plan: ?   Consultants:  Dr. Delano [facility] Nephrology Dr. [name] cardiology  Procedures/Significant Events:  11/26 Echocardiogram: LVEF = 40%. Hypokinesis of the basal-midinferolateral and inferior myocardium.  -(grade 2 diastolic dysfunction). - Aortic valve: mild to moderate stenosis.     VENTILATOR SETTINGS:    Cultures 11/25 blood 2 NGTD 11/26 MRSA by PCR negative 11/26 urine positive multiple species   Antimicrobials: Zosyn 11/25>>11/30 Vancomycin 11/25>> 11/28   Devices    LINES / TUBES:      Continuous Infusions:   Objective: Vitals:   05/15/16 2245 05/15/16 2322 05/16/16 0400 05/16/16 0406  BP: 93/60 100/60    Pulse: 88  80   Resp: 16 15 (!) 24   Temp:    98.2 F (36.8 C)  TempSrc:    Oral  SpO2:   97%   Weight:    68.8 kg (151 lb 11.2 oz)  Height:        Intake/Output Summary (Last 24  hours) at 05/16/16 [id number] Last data filed at 05/16/16 0741  Gross per 24 hour  Intake              463 ml  Output             2760 ml  Net            -2297 ml   Filed Weights   05/14/16 0345 05/15/16 0424 05/16/16 0406  Weight: 70 kg (154 lb 5.2 oz) 70.2 kg (154 lb 11.2 oz) 68.8 kg (151 lb 11.2 oz)    Examination:  General: A/O 4, positive acute respiratory distress Eyes: negative scleral hemorrhage, negative anisocoria, negative  icterus ENT: Negative Runny nose, negative gingival bleeding, Neck:  Negative scars, masses, torticollis, lymphadenopathy, JVD Lungs: Clear to auscultation bilaterally without wheezes or crackles Cardiovascular: Regular rate and rhythm without murmur gallop or rub normal S1 and S2 Abdomen: negative abdominal pain, nondistended, positive soft, bowel sounds, no rebound, no ascites, no appreciable mass Extremities: No significant cyanosis, clubbing, or edema bilateral lower extremities Skin: Negative rashes, lesions, ulcers Psychiatric:  Negative depression, negative anxiety, negative fatigue, negative mania  Central nervous system:  Cranial nerves II through XII intact, tongue/uvula midline, all extremities muscle strength 5/5, sensation intact throughout, negative dysarthria, negative expressive aphasia, negative receptive aphasia.  .     Data Reviewed: Care during the described time interval was provided by me .  I have reviewed this patient's available data, including medical history, events of note, physical examination, and all test results as part of my evaluation. I have personally reviewed and interpreted all radiology studies.  CBC:  Recent Labs Lab 05/11/16 2058 05/12/16 0340 05/13/16 0121 05/14/16 0324 05/15/16 1811 05/16/16 0326  WBC 9.5 14.3* 7.1 4.5 6.0 6.5  NEUTROABS 8.9*  --  6.0 3.3  --  5.1  HGB 10.7* 8.9* 8.8* 7.9* 10.0* 9.4*  HCT 29.9* 25.8* 25.7* 22.5* 28.7* 26.5*  MCV 107.6* 111.2* 108.9* 109.8* 108.3* 105.6*  PLT 157 124* 89* 77* 101* 103*   Basic Metabolic Panel:  Recent Labs Lab 05/11/16 2105 05/12/16 0340 05/13/16 0121 05/13/16 0145 05/14/16 0324 05/15/16 1811 05/16/16 0326  NA  --  138 135  --  134* 135 139  K  --  4.2 4.4  --  4.4 4.9 3.3*  CL  --  107 106  --  107 109 97*  CO2  --  22 20*  --  17* 15* 26  GLUCOSE  --  180* 114*  --  110* 172* 147*  BUN  --  25* 37*  --  49* 53* 20  CREATININE  --  5.02* 6.32* 6.25* 7.57* 8.62* 4.39*    CALCIUM  --  8.7* 9.0  --  8.8* 9.7 8.4*  MG 1.3*  --  2.0  --  2.0  --  1.9  PHOS  --   --  3.5  --  3.6 4.3 3.0   GFR: Estimated Creatinine Clearance: 12.9 mL/min (by C-G formula based on SCr of 4.39 mg/dL (H)). Liver Function Tests:  Recent Labs Lab 05/11/16 2058 05/12/16 0340 05/13/16 0121 05/14/16 0324 05/15/16 1811 05/16/16 0326  AST 38 45*  --   --   --   --   ALT 21 23  --   --   --   --   ALKPHOS 58 51  --   --   --   --   BILITOT 1.5* 1.0  --   --   --   --  PROT 7.6 6.3*  --   --   --   --   ALBUMIN 4.1 3.3* 3.5 3.1* 3.0* 2.6*   No results for input(s): LIPASE, AMYLASE in the last 168 hours. No results for input(s): AMMONIA in the last 168 hours. Coagulation Profile:  Recent Labs Lab 05/11/16 2058  INR 1.06   Cardiac Enzymes:  Recent Labs Lab 05/11/16 2057 05/12/16 0725 05/12/16 1315 05/12/16 1925 05/13/16 1010  TROPONINI 0.42* 3.40* 4.88* 4.74* 3.79*   BNP (last 3 results) No results for input(s): PROBNP in the last 8760 hours. HbA1C: No results for input(s): HGBA1C in the last 72 hours. CBG:  Recent Labs Lab 05/14/16 1157 05/14/16 2125 05/15/16 0802 05/15/16 1218 05/15/16 1634  GLUCAP 151* 97 102* 95 76   Lipid Profile: No results for input(s): CHOL, HDL, LDLCALC, TRIG, CHOLHDL, LDLDIRECT in the last 72 hours. Thyroid Function Tests: No results for input(s): TSH, T4TOTAL, FREET4, T3FREE, THYROIDAB in the last 72 hours. Anemia Panel:  Recent Labs  05/15/16 1811  TIBC 238*  IRON 83   Urine analysis:    Component Value Date/Time   COLORURINE YELLOW 06/12/2013 1807   APPEARANCEUR CLOUDY (A) 06/12/2013 1807   LABSPEC 1.010 06/12/2013 1807   PHURINE 5.5 06/12/2013 1807   GLUCOSEU NEGATIVE 06/12/2013 1807   HGBUR MODERATE (A) 06/12/2013 1807   BILIRUBINUR NEGATIVE 06/12/2013 1807   KETONESUR NEGATIVE 06/12/2013 1807   PROTEINUR 100 (A) 06/12/2013 1807   UROBILINOGEN 0.2 06/12/2013 1807   NITRITE NEGATIVE 06/12/2013 1807    LEUKOCYTESUR LARGE (A) 06/12/2013 1807   Sepsis Labs: @LABRCNTIP (procalcitonin:4,lacticidven:4)  ) Recent Results (from the past 240 hour(s))  Culture, blood (Routine x 2)     Status: None (Preliminary result)   Collection Time: 05/11/16  8:58 PM  Result Value Ref Range Status   Specimen Description BLOOD RIGHT HAND  Final   Special Requests BOTTLES DRAWN AEROBIC AND ANAEROBIC 5CC  Final   Culture   Final    NO GROWTH 3 DAYS [facility]    Report Status PENDING  Incomplete  Culture, blood (Routine x 2)     Status: None (Preliminary result)   Collection Time: 05/11/16  8:58 PM  Result Value Ref Range Status   Specimen Description BLOOD RIGHT FOREARM  Final   Special Requests IN PEDIATRIC BOTTLE 4 CC  Final   Culture   Final    NO GROWTH 3 DAYS Performed at [facility]    Report Status PENDING  Incomplete  MRSA PCR Screening     Status: None   Collection Time: 05/12/16 11:05 AM  Result Value Ref Range Status   MRSA by PCR NEGATIVE NEGATIVE Final    Comment:        The GeneXpert MRSA Assay (FDA approved for NASAL specimens only), is one component of a comprehensive MRSA colonization surveillance program. It is not intended to diagnose MRSA infection nor to guide or monitor treatment for MRSA infections.   Culture, Urine     Status: Abnormal   Collection Time: 05/12/16  2:55 PM  Result Value Ref Range Status   Specimen Description URINE, CLEAN CATCH  Final   Special Requests Normal  Final   Culture MULTIPLE SPECIES PRESENT, SUGGEST RECOLLECTION (A)  Final   Report Status 05/14/2016 FINAL  Final         Radiology Studies: No results found.      Scheduled Meds: . aspirin  81 mg Oral Daily  . atorvastatin  80 mg Oral Daily  . calcium acetate  1,334 mg Oral TID WC  . [START ON 05/22/2016] darbepoetin (ARANESP) injection - DIALYSIS  200 mcg Intravenous Q Wed-HD  . midodrine  2.5 mg Oral TID WC  . multivitamin  1 tablet Oral QHS    . piperacillin-tazobactam (ZOSYN)  IV  3.375 g Intravenous Q12H  . sodium chloride flush  3 mL Intravenous Q12H  . ticagrelor  90 mg Oral BID   Continuous Infusions:   LOS: 5 days    Time spent: 40 minutes    Jefte Carithers, Roselind Messier, MD [facility] Pager [phone]   If 7PM-7AM, please contact night-coverage [email or web address] Password [email or web address] 05/16/2016, 8:23 AM

## 2016-05-16 NOTE — Progress Notes (Signed)
Patient transported to dialysis

## 2016-05-16 NOTE — Procedures (Signed)
I was present at this session.  I have reviewed the session itself and made appropriate changes.  Hd via LUA AVF. bp in 90s, not getting goal vol. Access press ok  Cristiana Yochim L 11/30/201712:36 PM

## 2016-05-16 NOTE — Progress Notes (Signed)
Pt returned from dialysis at approx 2320

## 2016-05-17 DIAGNOSIS — S065XAA Traumatic subdural hemorrhage with loss of consciousness status unknown, initial encounter: Secondary | ICD-10-CM

## 2016-05-17 DIAGNOSIS — I6200 Nontraumatic subdural hemorrhage, unspecified: Secondary | ICD-10-CM

## 2016-05-17 DIAGNOSIS — S065X9A Traumatic subdural hemorrhage with loss of consciousness of unspecified duration, initial encounter: Secondary | ICD-10-CM

## 2016-05-17 LAB — URINALYSIS, ROUTINE W REFLEX MICROSCOPIC
BILIRUBIN URINE: NEGATIVE
Glucose, UA: NEGATIVE mg/dL
Ketones, ur: 15 mg/dL — AB
NITRITE: NEGATIVE
PH: 7 (ref 5.0–8.0)
Protein, ur: 100 mg/dL — AB
Specific Gravity, Urine: 1.02 (ref 1.005–1.030)

## 2016-05-17 LAB — RENAL FUNCTION PANEL
ANION GAP: 17 — AB (ref 5–15)
Albumin: 3.2 g/dL — ABNORMAL LOW (ref 3.5–5.0)
BUN: 24 mg/dL — ABNORMAL HIGH (ref 6–20)
CHLORIDE: 96 mmol/L — AB (ref 101–111)
CO2: 23 mmol/L (ref 22–32)
CREATININE: 4.31 mg/dL — AB (ref 0.61–1.24)
Calcium: 9.2 mg/dL (ref 8.9–10.3)
GFR calc non Af Amer: 12 mL/min — ABNORMAL LOW (ref >60)
GFR, EST AFRICAN AMERICAN: 14 mL/min — AB (ref >60)
Glucose, Bld: 137 mg/dL — ABNORMAL HIGH (ref 65–99)
Phosphorus: 3.9 mg/dL (ref 2.5–4.6)
Potassium: 4.1 mmol/L (ref 3.5–5.1)
Sodium: 136 mmol/L (ref 135–145)

## 2016-05-17 LAB — CULTURE, BLOOD (ROUTINE X 2)
CULTURE: NO GROWTH
Culture: NO GROWTH

## 2016-05-17 LAB — CBC WITH DIFFERENTIAL/PLATELET
Basophils Absolute: 0 10*3/uL (ref 0.0–0.1)
Basophils Relative: 0 %
Eosinophils Absolute: 0.1 10*3/uL (ref 0.0–0.7)
Eosinophils Relative: 1 %
HEMATOCRIT: 31.2 % — AB (ref 39.0–52.0)
HEMOGLOBIN: 10.9 g/dL — AB (ref 13.0–17.0)
LYMPHS ABS: 1.7 10*3/uL (ref 0.7–4.0)
Lymphocytes Relative: 16 %
MCH: 37.5 pg — AB (ref 26.0–34.0)
MCHC: 34.9 g/dL (ref 30.0–36.0)
MCV: 107.2 fL — ABNORMAL HIGH (ref 78.0–100.0)
MONOS PCT: 11 %
Monocytes Absolute: 1.2 10*3/uL — ABNORMAL HIGH (ref 0.1–1.0)
NEUTROS ABS: 7.5 10*3/uL (ref 1.7–7.7)
NEUTROS PCT: 72 %
PLATELETS: 149 10*3/uL — AB (ref 150–400)
RBC: 2.91 MIL/uL — ABNORMAL LOW (ref 4.22–5.81)
RDW: 14.2 % (ref 11.5–15.5)
WBC: 10.5 10*3/uL (ref 4.0–10.5)

## 2016-05-17 LAB — URINE MICROSCOPIC-ADD ON

## 2016-05-17 LAB — GLUCOSE, CAPILLARY
GLUCOSE-CAPILLARY: 130 mg/dL — AB (ref 65–99)
GLUCOSE-CAPILLARY: 149 mg/dL — AB (ref 65–99)

## 2016-05-17 LAB — MAGNESIUM: Magnesium: 2.1 mg/dL (ref 1.7–2.4)

## 2016-05-17 MED ORDER — TRAZODONE HCL 50 MG PO TABS: 25.0000 mg | ORAL_TABLET | Freq: Every evening | ORAL | Status: DC | PRN

## 2016-05-17 MED ORDER — DARBEPOETIN ALFA 200 MCG/0.4ML IJ SOSY
200.0000 ug | PREFILLED_SYRINGE | INTRAMUSCULAR | Status: DC
  Administered 2016-05-23: 200 ug via INTRAVENOUS
  Filled 2016-05-17: qty 0.4

## 2016-05-17 MED ORDER — HYDROXYZINE HCL 25 MG PO TABS
25.0000 mg | ORAL_TABLET | Freq: Three times a day (TID) | ORAL | Status: DC | PRN
  Administered 2016-05-17: 25 mg via ORAL
  Filled 2016-05-17: qty 1

## 2016-05-17 MED ORDER — CALAMINE EX LOTN
TOPICAL_LOTION | CUTANEOUS | Status: DC | PRN
  Administered 2016-05-18: 1 via TOPICAL
  Filled 2016-05-17: qty 118

## 2016-05-17 NOTE — Progress Notes (Signed)
PROGRESS NOTE    Jacob Fields  NWG:[id number] DOB: [birth year] DOA: 05/11/2016 PCP: No primary care provider on file.   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  The patient lives alone in [place], [place]. He is up here to visit his daughter for the holiday.  Over the last week or more, son notes his father has complained of malaise.    Then today, the patient had his Friday dialysis treatment as a guest at a center in [place]. Afterwards, he was weak, febrile and had decreased responsiveness, so family called 9-1-1.  EMS found him altered, febrile, with chills and administered acetaminophen en route.   Subjective: 12/1  A/O 4, NAD. Overnight patient had sundowning/delirium became combative with staff. Throwing IV pole and hitting staff. However has totally cleared this A.m. son confirms that this occurred once before when patient was hospitalized.      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 -Blood and urine cultures NGTD  ESRD on HD T/Th/S:  -Continue Phoslo -Spoke with Dr. [name] who has agreed to dialyze patient on Saturday and arrange for outpatient HD next week  New Atrial Fibrillation(CHADS2Vasc 4) -Currently rate controlled.. -Per cardiology Dr. [name]  needs to be evaluated for "Watchman device" for non valvular  atrial fibrillation with contraindication to long term anticoagulation. I have advised him to discuss this further with his cardiologist in [place], [place].  -Patient and family have decided to palpation moved to [facility] area for definitive care.   NSTEMI/Chronic Systolic and Diastolic CHF  -Per patient and family has had a workup in the past and was informed needed a CABG but would not survive surgery. -Spoke with Dr. [name] Cardiology who has agreed to see patient on Thursday 7 December 14 30 at follow-up.   Gout:  -Uloric 20 mg daily  History of stroke:  -Continue aspirin 325, statin  Ulcerative colitis:  -WOC consult  Other medications:  -Hold Amitriptyline 75 mg QHS   SDH -Occurred~2015 therefore not appropriate for long-term anticoagulation   Goals of care -Family meeting today with children and patient. Patient agrees it would be in his best interest to moved to [facility] with his children for definitive care of his cardiac issues. Have arranged follow-up with Dr. [name] -Also notified Dr. [name] in order to arrange for HD. -PT has recommended SNF and patient has agreed. Consult to social work    DVT prophylaxis: SCD Code Status: DO NOT RESUSCITATE Family Communication: None Disposition Plan:   Consultants:  Dr. [name] Dr. [name]    Procedures/Significant Events:  11/26 Echocardiogram: LVEF = 40%. Hypokinesis of the basal-midinferolateral and inferior myocardium.  -(grade 2 diastolic dysfunction). - Aortic valve: mild to moderate stenosis.     VENTILATOR SETTINGS:    Cultures 11/25 blood 2 NGTD 11/26 MRSA by PCR negative 11/26  urine positive multiple species   Antimicrobials: Zosyn 11/25>>11/30 Vancomycin 11/25>> 11/28   Devices    LINES / TUBES:      Continuous Infusions:   Objective: Vitals:   05/16/16 2000 05/16/16 2200 05/17/16 0000 05/17/16 0400  BP:  105/62      Pulse: 81     Resp: 18 15  16   Temp: 99.2 F (37.3 C)  98 F (36.7 C) 98.2 F (36.8 C)  TempSrc: Oral  Oral Oral  SpO2: 99%     Weight:      Height:        Intake/Output Summary (Last 24 hours) at 05/17/16 16100733 Last data filed at 05/17/16 0600  Gross per 24 hour  Intake              100 ml  Output             1248 ml  Net            -1148 ml   Filed Weights   05/16/16 0406 05/16/16 0807 05/16/16 1213  Weight: 68.8 kg (151 lb 11.2 oz) 66.7 kg (147 lb 0.8 oz) 66.1 kg (145 lb 11.6 oz)    Examination:  General: A/O 4, positive acute respiratory distress Eyes: negative scleral hemorrhage, negative anisocoria, negative icterus ENT: Negative Runny nose, negative gingival bleeding, Neck:  Negative scars, masses, torticollis, lymphadenopathy, JVD Lungs: Clear to auscultation bilaterally without wheezes or crackles Cardiovascular: Regular rate and rhythm without murmur gallop or rub normal S1 and S2 Abdomen: negative abdominal pain, nondistended, positive soft, bowel sounds, no rebound, no ascites, no appreciable mass Extremities: No significant cyanosis, clubbing, or edema bilateral lower extremities Skin: Negative rashes, lesions, ulcers Psychiatric:  Negative depression, negative anxiety, negative fatigue, negative mania  Central nervous system:  Cranial nerves II through XII intact, tongue/uvula midline, all extremities muscle strength 5/5, sensation intact throughout, negative dysarthria, negative expressive aphasia, negative receptive aphasia.  .     Data Reviewed: Care during the described time interval was provided by me .  I have reviewed this patient's available data, including medical history, events of note, physical examination, and all test results as part of my evaluation. I have personally reviewed and interpreted all radiology studies.  CBC:  Recent Labs Lab 05/11/16 2058  05/13/16 0121 05/14/16 0324 05/15/16 1811 05/16/16 0326 05/17/16 0358  WBC 9.5  <  > 7.1 4.5 6.0 6.5 10.5  NEUTROABS 8.9*  --  6.0 3.3  --  5.1 7.5  HGB 10.7*  < > 8.8* 7.9* 10.0* 9.4* 10.9*  HCT 29.9*  < > 25.7* 22.5* 28.7* 26.5* 31.2*  MCV 107.6*  < > 108.9* 109.8* 108.3* 105.6* 107.2*  PLT 157  < > 89* 77* 101* 103* 149*  < > = values in this interval not displayed. Basic Metabolic Panel:  Recent Labs Lab 05/11/16 2105  05/13/16 0121 05/13/16 0145 05/14/16 0324 05/15/16 1811 05/16/16 0326 05/17/16 0358  NA  --   < > 135  --  134* 135 139 136  K  --   < > 4.4  --  4.4 4.9 3.3* 4.1  CL  --   < > 106  --  107 109 97* 96*  CO2  --   < > 20*  --  17* 15* 26 23  GLUCOSE  --   < > 114*  --  110* 172* 147* 137*  BUN  --   < > 37*  --  49* 53* 20 24*  CREATININE  --   < > 6.32* 6.25* 7.57* 8.62* 4.39* 4.31*  CALCIUM  --   < > 9.0  --  8.8* 9.7 8.4* 9.2  MG 1.3*  --  2.0  --  2.0  --  1.9 2.1  PHOS  --   --  3.5  --  3.6 4.3 3.0 3.9  < > = values in this interval not displayed. GFR: Estimated Creatinine Clearance: 13.2 mL/min (by C-G formula based on SCr of 4.31 mg/dL (H)). Liver Function Tests:  Recent Labs Lab 05/11/16 2058 05/12/16 0340 05/13/16 0121 05/14/16 0324 05/15/16 1811 05/16/16 0326 05/17/16 0358  AST 38 45*  --   --   --   --   --   ALT 21 23  --   --   --   --   --   ALKPHOS 58 51  --   --   --   --   --   BILITOT 1.5* 1.0  --   --   --   --   --   PROT 7.6 6.3*  --   --   --   --   --   ALBUMIN 4.1 3.3* 3.5 3.1* 3.0* 2.6* 3.2*   No results for input(s): LIPASE, AMYLASE in the last 168 hours. No results for input(s): AMMONIA in the last 168 hours. Coagulation Profile:  Recent Labs Lab 05/11/16 2058  INR 1.06   Cardiac Enzymes:  Recent Labs Lab 05/11/16 2057 05/12/16 0725 05/12/16 1315 05/12/16 1925 05/13/16 1010  TROPONINI 0.42* 3.40* 4.88* 4.74* 3.79*   BNP (last 3 results) No results for input(s): PROBNP in the last 8760 hours. HbA1C: No results for input(s): HGBA1C in the last 72 hours. CBG:  Recent Labs Lab  05/15/16 0802 05/15/16 1218 05/15/16 1634 05/16/16 1343 05/16/16 1606  GLUCAP 102* 95 76 131* 114*   Lipid Profile: No results for input(s): CHOL, HDL, LDLCALC, TRIG, CHOLHDL, LDLDIRECT in the last 72 hours. Thyroid Function Tests: No results for input(s): TSH, T4TOTAL, FREET4, T3FREE, THYROIDAB in the last 72 hours. Anemia Panel:  Recent Labs  05/15/16 1811  TIBC 238*  IRON 83   Urine analysis:    Component Value Date/Time   COLORURINE YELLOW 06/12/2013 1807   APPEARANCEUR CLOUDY (A) 06/12/2013 1807   LABSPEC 1.010 06/12/2013 1807   PHURINE 5.5 06/12/2013 1807   GLUCOSEU NEGATIVE 06/12/2013 1807   HGBUR MODERATE (A) 06/12/2013 1807   BILIRUBINUR NEGATIVE 06/12/2013 1807   KETONESUR NEGATIVE 06/12/2013 1807   PROTEINUR 100 (A) 06/12/2013 1807   UROBILINOGEN 0.2 06/12/2013 1807   NITRITE NEGATIVE 06/12/2013 1807   LEUKOCYTESUR LARGE (A) 06/12/2013 1807   Sepsis Labs: @LABRCNTIP (procalcitonin:4,lacticidven:4)  ) Recent Results (from the past 240 hour(s))  Culture, blood (Routine x 2)     Status: None (Preliminary result)   Collection Time: 05/11/16  8:58 PM  Result Value Ref Range Status   Specimen Description BLOOD RIGHT HAND  Final   Special Requests BOTTLES DRAWN AEROBIC AND ANAEROBIC 5CC  Final   Culture   Final    NO GROWTH 4 DAYS Performed at [facility]    Report Status PENDING  Incomplete  Culture, blood (Routine x 2)     Status: None (Preliminary result)   Collection Time: 05/11/16  8:58 PM  Result Value Ref Range Status   Specimen Description BLOOD RIGHT FOREARM  Final   Special Requests IN PEDIATRIC BOTTLE 4 CC  Final   Culture   Final  NO GROWTH 4 DAYS [facility]    Report Status PENDING  Incomplete  MRSA PCR Screening     Status: None   Collection Time: 05/12/16 11:05 AM  Result Value Ref Range Status   MRSA by PCR NEGATIVE NEGATIVE Final    Comment:        The GeneXpert MRSA Assay (FDA approved for NASAL  specimens only), is one component of a comprehensive MRSA colonization surveillance program. It is not intended to diagnose MRSA infection nor to guide or monitor treatment for MRSA infections.   Culture, Urine     Status: Abnormal   Collection Time: 05/12/16  2:55 PM  Result Value Ref Range Status   Specimen Description URINE, CLEAN CATCH  Final   Special Requests Normal  Final   Culture MULTIPLE SPECIES PRESENT, SUGGEST RECOLLECTION (A)  Final   Report Status 05/14/2016 FINAL  Final         Radiology Studies: No results found.      Scheduled Meds: . aspirin  81 mg Oral Daily  . atorvastatin  80 mg Oral Daily  . calcium acetate  1,334 mg Oral TID WC  . [START ON 05/22/2016] darbepoetin (ARANESP) injection - DIALYSIS  200 mcg Intravenous Q Wed-HD  . febuxostat  20 mg Oral Daily  . midodrine  2.5 mg Oral TID WC  . multivitamin  1 tablet Oral QHS  . piperacillin-tazobactam (ZOSYN)  IV  3.375 g Intravenous Q12H  . sodium chloride flush  3 mL Intravenous Q12H  . ticagrelor  90 mg Oral BID   Continuous Infusions:   LOS: 6 days    Time spent: 40 minutes    Shyonna Carlin, Roselind MessierURTIS J, MD [facility] Pager [phone]   If 7PM-7AM, please contact night-coverage [email or web address] Password [email or web address] 05/17/2016, 7:33 AM

## 2016-05-17 NOTE — Progress Notes (Signed)
Subjective: Interval History: confused last pm , does not remember.  Objective: Vital signs in last 24 hours: Temp:  [97.6 F (36.4 C)-99.2 F (37.3 C)] 97.6 F (36.4 C) (12/01 1151) Pulse Rate:  [78-90] 90 (12/01 1151) Resp:  [13-23] 19 (12/01 1151) BP: (87-133)/(61-70) 110/61 (12/01 1151) SpO2:  [95 %-100 %] 98 % (12/01 1151) Weight change: -2.111 kg (-4 lb 10.5 oz)  Intake/Output from previous day: 11/30 0701 - 12/01 0700 In: 100 [IV Piggyback:100] Out: 1248 [Urine:81; Stool:650] Intake/Output this shift: Total I/O In: 290 [P.O.:240; IV Piggyback:50] Out: -   General appearance: cooperative, no distress and pale Resp: rales RLL Cardio: S1, S2 normal and systolic murmur: systolic ejection 2/6, decrescendo at 2nd left intercostal space GI: pos bs, liver down 5 cm Extremities: AVF RUA  Lab Results:  Recent Labs  05/16/16 0326 05/17/16 0358  WBC 6.5 10.5  HGB 9.4* 10.9*  HCT 26.5* 31.2*  PLT 103* 149*   BMET:  Recent Labs  05/16/16 0326 05/17/16 0358  NA 139 136  K 3.3* 4.1  CL 97* 96*  CO2 26 23  GLUCOSE 147* 137*  BUN 20 24*  CREATININE 4.39* 4.31*  CALCIUM 8.4* 9.2    Recent Labs  05/15/16 1811  PTH 52   Iron Studies:  Recent Labs  05/15/16 1811  IRON 83  TIBC 238*    Studies/Results: No results found.  I have reviewed the patient's current medications.  Assessment/Plan: 1 ESRD for Hd in am.  TO stay in [facility]  ,will CLIP 2 Sepsis resolved 3 Anemia esa 4 HPTH vit D 5 confusion sundowning,, ? Chronic P HD,CLIP    LOS: 6 days   Nikitia Asbill L 05/17/2016,2:00 PM

## 2016-05-17 NOTE — Progress Notes (Signed)
Pt remains confused, oriented only to self, now becoming a danger to himself and violent with staff I.e., removing leads/other monitoring equipment, throwing IV pole at RN, etc. MD paged.   Non-violent restraints ordered and applied, pt education provided, daughter notified of this and is in agreement with restraint application.  Will monitor pt closely.

## 2016-05-17 NOTE — Evaluation (Signed)
Physical Therapy Evaluation Patient Details Name: Jacob MangoRobert M Fields MRN: [id number] DOB: 09-24-1940 Today's Date: 05/17/2016   History of Present Illness  Pt adm with sepsis of unknown source. Pt also with new afib. PMH - ESRD on HD, HTN, CAD, chf, gout, CVA, ileostomy  Clinical Impression  Pt admitted with above diagnosis and presents to PT with functional limitations due to deficits listed below (See PT problem list). Pt needs skilled PT to maximize independence and safety to allow discharge to ST-SNF. Pt very unsteady with mobility and needing assist for all mobility. Likely will eventually go and stay with daughter but feel he needs further rehab prior to that to incr safety and decr burden of care.     Follow Up Recommendations SNF    Equipment Recommendations  None recommended by PT    Recommendations for Other Services       Precautions / Restrictions Precautions Precautions: Fall Precaution Comments: ileostomy Restrictions Weight Bearing Restrictions: No      Mobility  Bed Mobility               General bed mobility comments: Pt up in chair  Transfers Overall transfer level: Needs assistance Equipment used: Rolling walker (2 wheeled) Transfers: Sit to/from Stand Sit to Stand: Min assist         General transfer comment: Assist to bring hips up and for balance  Ambulation/Gait Ambulation/Gait assistance: Min assist Ambulation Distance (Feet): 140 Feet Assistive device: Rolling walker (2 wheeled) Gait Pattern/deviations: Step-through pattern;Decreased stride length;Staggering left;Staggering right;Trunk flexed Gait velocity: decr Gait velocity interpretation: Below normal speed for age/gender General Gait Details: Assist for balance and stability  Stairs            Wheelchair Mobility    Modified Rankin (Stroke Patients Only)       Balance Overall balance assessment: Needs assistance Sitting-balance support: No upper extremity  supported;Feet supported Sitting balance-Leahy Scale: Good     Standing balance support: Single extremity supported Standing balance-Leahy Scale: Poor Standing balance comment: UE support and min A for static standing                             Pertinent Vitals/Pain Pain Assessment: No/denies pain    Home Living Family/patient expects to be discharged to:: Skilled nursing facility Living Arrangements: Alone             Home Equipment: Walker - 2 wheels Additional Comments: [place]. Visiting daughter here for holidays. After SNF will go to daughter's home    Prior Function Level of Independence: Independent         Comments: drives     Hand Dominance        Extremity/Trunk Assessment   Upper Extremity Assessment: Defer to OT evaluation           Lower Extremity Assessment: Generalized weakness         Communication   Communication: HOH  Cognition Arousal/Alertness: Awake/alert Behavior During Therapy: WFL for tasks assessed/performed Overall Cognitive Status: Impaired/Different from baseline Area of Impairment: Memory;Problem solving;Safety/judgement     Memory: Decreased short-term memory   Safety/Judgement: Decreased awareness of deficits   Problem Solving: Slow processing      General Comments      Exercises     Assessment/Plan    PT Assessment Patient needs continued PT services  PT Problem List Decreased strength;Decreased activity tolerance;Decreased balance;Decreased mobility;Decreased cognition;Decreased knowledge of use  of DME;Decreased safety awareness          PT Treatment Interventions DME instruction;Gait training;Functional mobility training;Therapeutic activities;Therapeutic exercise;Balance training;Patient/family education    PT Goals (Current goals can be found in the Care Plan section)  Acute Rehab PT Goals Patient Stated Goal: not stated PT Goal Formulation: With  patient/family Time For Goal Achievement: 05/31/16 Potential to Achieve Goals: Good    Frequency Min 3X/week   Barriers to discharge        Co-evaluation               End of Session Equipment Utilized During Treatment: Gait belt Activity Tolerance: Patient tolerated treatment well Patient left: in chair;with call bell/phone within reach;with chair alarm set;with family/visitor present Nurse Communication: Mobility status         Time: 1435-1501 PT Time Calculation (min) (ACUTE ONLY): 26 min   Charges:   PT Evaluation $PT Eval Moderate Complexity: 1 Procedure PT Treatments $Gait Training: 8-22 mins   PT G CodesAngelina Ok:        Aviyah Swetz W [place] 05/17/2016, 4:15 PM [employer] CorporationCary Santresa Levett PT [phone]

## 2016-05-17 NOTE — Progress Notes (Signed)
Family arrived at patients bedside. Patient calm and following commands. When asked orientation questions patient A&Ox4. When asking patient if he remembers calling the cops and the commotion through the night patient states, "he would never call the cops and do such a thing". Restraints removed. Family requested to notify myself or team member if they leave to ensure a safe environment for patient. Will continue to monitor closely.

## 2016-05-18 LAB — GLUCOSE, CAPILLARY
GLUCOSE-CAPILLARY: 120 mg/dL — AB (ref 65–99)
GLUCOSE-CAPILLARY: 96 mg/dL (ref 65–99)
Glucose-Capillary: 130 mg/dL — ABNORMAL HIGH (ref 65–99)
Glucose-Capillary: 122 mg/dL — ABNORMAL HIGH (ref 65–99)
Glucose-Capillary: 99 mg/dL (ref 65–99)

## 2016-05-18 LAB — RENAL FUNCTION PANEL
ANION GAP: 15 (ref 5–15)
Albumin: 3 g/dL — ABNORMAL LOW (ref 3.5–5.0)
BUN: 45 mg/dL — ABNORMAL HIGH (ref 6–20)
CHLORIDE: 98 mmol/L — AB (ref 101–111)
CO2: 22 mmol/L (ref 22–32)
CREATININE: 6.45 mg/dL — AB (ref 0.61–1.24)
Calcium: 9.7 mg/dL (ref 8.9–10.3)
GFR, EST AFRICAN AMERICAN: 9 mL/min — AB (ref >60)
GFR, EST NON AFRICAN AMERICAN: 7 mL/min — AB (ref >60)
Glucose, Bld: 123 mg/dL — ABNORMAL HIGH (ref 65–99)
POTASSIUM: 4.1 mmol/L (ref 3.5–5.1)
Phosphorus: 4.8 mg/dL — ABNORMAL HIGH (ref 2.5–4.6)
Sodium: 135 mmol/L (ref 135–145)

## 2016-05-18 LAB — TROPONIN I
TROPONIN I: 0.5 ng/mL — AB (ref <0.03)
Troponin I: 0.43 ng/mL (ref <0.03)
Troponin I: 0.4 ng/mL (ref <0.03)

## 2016-05-18 LAB — MAGNESIUM: Magnesium: 2.2 mg/dL (ref 1.7–2.4)

## 2016-05-18 LAB — URINE CULTURE: CULTURE: NO GROWTH

## 2016-05-18 LAB — CBC WITH DIFFERENTIAL/PLATELET
Basophils Absolute: 0 10*3/uL (ref 0.0–0.1)
Basophils Relative: 0 %
EOS ABS: 0.2 10*3/uL (ref 0.0–0.7)
Eosinophils Relative: 2 %
HEMATOCRIT: 28.8 % — AB (ref 39.0–52.0)
HEMOGLOBIN: 10.2 g/dL — AB (ref 13.0–17.0)
LYMPHS ABS: 1.1 10*3/uL (ref 0.7–4.0)
LYMPHS PCT: 11 %
MCH: 37.2 pg — AB (ref 26.0–34.0)
MCHC: 35.4 g/dL (ref 30.0–36.0)
MCV: 105.1 fL — AB (ref 78.0–100.0)
Monocytes Absolute: 0.8 10*3/uL (ref 0.1–1.0)
Monocytes Relative: 8 %
NEUTROS ABS: 8.2 10*3/uL — AB (ref 1.7–7.7)
NEUTROS PCT: 79 %
Platelets: 171 10*3/uL (ref 150–400)
RBC: 2.74 MIL/uL — AB (ref 4.22–5.81)
RDW: 13.8 % (ref 11.5–15.5)
WBC: 10.3 10*3/uL (ref 4.0–10.5)

## 2016-05-18 MED ORDER — MIDODRINE HCL 5 MG PO TABS
ORAL_TABLET | ORAL | Status: AC
  Filled 2016-05-18: qty 1

## 2016-05-18 MED ORDER — MIDODRINE HCL 5 MG PO TABS
ORAL_TABLET | ORAL | Status: AC
  Filled 2016-05-18: qty 3

## 2016-05-18 NOTE — Progress Notes (Signed)
Patient complains of 10/10 chest pain. EKG done. Patient appears comfortable and not in distress. Vitals stable. MD paged, troponin series ordered. Patient resting/sleeping now. Will continue to monitor.

## 2016-05-18 NOTE — Progress Notes (Signed)
PROGRESS NOTE    Jacob Fields  ZOX:[id number] DOB: June 13, 1941 DOA: 05/11/2016 PCP: No primary care provider on file.   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  The patient lives alone in [place], [place]. He is up here to visit his daughter for the holiday.  Over the last week or more, son notes his father has complained of malaise.    Then today, the patient had his Friday dialysis treatment as a guest at a center in [place]. Afterwards, he was weak, febrile and had decreased responsiveness, so family called 9-1-1.  EMS found him altered, febrile, with chills and administered acetaminophen en route.   Subjective: 12/2 A/O 4, NAD. States was able to ambulate around ward yesterday. No complaints today.      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 -Blood and urine cultures NGTD  ESRD on HD T/Th/S:  -Continue Phoslo -Spoke with Dr. [name] who has agreed to dialyze patient on Saturday and arrange for outpatient HD next week  New Atrial Fibrillation(CHADS2Vasc 4) -Currently rate controlled.. -Per cardiology Dr. [name]  needs to be evaluated for "Watchman device" for non valvular atrial fibrillation with contraindication to long term anticoagulation. I have advised him to discuss this further with his  cardiologist in [place], [place].  -Patient and family have decided to move to [facility] area for definitive care.   NSTEMI/Chronic Systolic and Diastolic CHF  -Per patient and family has had a workup in the past and was informed needed a CABG but would not survive surgery. -Spoke with Dr. [name] Cardiology who has agreed to see patient on Thursday 7 December 14 30 at follow-up.   Gout:  -Uloric 20 mg daily  History of stroke:  -Continue aspirin 325, statin  Ulcerative colitis:  -WOC consult  Other medications:  -Hold Amitriptyline 75 mg QHS   SDH -Occurred~2015 therefore not appropriate for long-term anticoagulation  Anemia Chronic Disease? -Most likely chronic disease, but will obtain anemia panel     Goals of care -Family meeting today with children and patient. Patient agrees it would be in his best interest to moved to [facility] with his children for definitive care of his cardiac issues. Have arranged follow-up with Dr. [name] -Also notified Dr. [name] in order to arrange for HD. -PT has recommended SNF and patient has agreed.  -12/2 spoke with LCSW Mitzi Davenport [phone] who has begun work on finding SNF for patient.     DVT prophylaxis: SCD Code Status: DO NOT RESUSCITATE Family Communication: None Disposition Plan:   Consultants:  Dr. [name] Dr. [name] Cardiology    Procedures/Significant Events:  11/26 Echocardiogram: LVEF = 40%. Hypokinesis of the basal-midinferolateral and inferior myocardium.  -(grade 2 diastolic dysfunction). - Aortic valve: mild to moderate stenosis.     VENTILATOR SETTINGS:  Cultures 11/25 blood 2 NGTD 11/26 MRSA by PCR negative 11/26 urine positive multiple species 12/1 urine negative   Antimicrobials: Zosyn 11/25>>11/30 Vancomycin 11/25>> 11/28    Devices    LINES / TUBES:      Continuous Infusions:   Objective: Vitals:   05/18/16 1015 05/18/16 1030  05/18/16 1045 05/18/16 1100  BP: (!) 99/24 (!) 86/38 (!) 101/48 (!) 83/47  Pulse: 77 79 78 86  Resp:      Temp:      TempSrc:      SpO2:      Weight:      Height:        Intake/Output Summary (Last 24 hours) at 05/18/16 1106 Last data filed at 05/18/16 [id number]  Gross per 24 hour  Intake              413 ml  Output              875 ml  Net             -462 ml   Filed Weights   05/16/16 1213 05/18/16 0424 05/18/16 0720  Weight: 66.1 kg (145 lb 11.6 oz) 64.5 kg (142 lb 4.8 oz) 64.9 kg (143 lb 1.3 oz)    Examination:  General: A/O 4, positive acute respiratory distress Eyes: negative scleral hemorrhage, negative anisocoria, negative icterus ENT: Negative Runny nose, negative gingival bleeding, Neck:  Negative scars, masses, torticollis, lymphadenopathy, JVD Lungs: Clear to auscultation bilaterally without wheezes or crackles Cardiovascular: Irregular irregular rhythm and rate, without murmur gallop or rub normal S1 and S2 Abdomen: negative abdominal pain, nondistended, positive soft, bowel sounds, no rebound, no ascites, no appreciable mass Extremities: No significant cyanosis, clubbing, or edema bilateral lower extremities Skin: Negative rashes, lesions, ulcers Psychiatric:  Negative depression, negative anxiety, negative fatigue, negative mania  Central nervous system:  Cranial nerves II through XII intact, tongue/uvula midline, all extremities muscle strength 5/5, sensation intact throughout, negative dysarthria, negative expressive aphasia, negative receptive aphasia.  .     Data Reviewed: Care during the described time interval was provided by me .  I have reviewed this patient's available data, including medical history, events of note, physical examination, and all test results as part of my evaluation. I have personally reviewed and interpreted all radiology studies.  CBC:  Recent Labs Lab 05/13/16 0121 05/14/16 0324 05/15/16 1811 05/16/16 0326 05/17/16 0358  05/18/16 0249  WBC 7.1 4.5 6.0 6.5 10.5 10.3  NEUTROABS 6.0 3.3  --  5.1 7.5 8.2*  HGB 8.8* 7.9* 10.0* 9.4* 10.9* 10.2*  HCT 25.7* 22.5* 28.7* 26.5* 31.2* 28.8*  MCV 108.9* 109.8* 108.3* 105.6* 107.2* 105.1*  PLT 89* 77* 101* 103* 149* 171   Basic Metabolic Panel:  Recent Labs Lab 05/13/16 0121  05/14/16 0324 05/15/16 1811 05/16/16 0326 05/17/16 0358 05/18/16 0249  NA 135  --  134* 135 139 136 135  K 4.4  --  4.4 4.9 3.3* 4.1 4.1  CL 106  --  107 109 97* 96* 98*  CO2 20*  --  17* 15* 26 23 22   GLUCOSE 114*  --  110* 172* 147* 137* 123*  BUN 37*  --  49* 53* 20 24* 45*  CREATININE 6.32*  < > 7.57* 8.62* 4.39* 4.31* 6.45*  CALCIUM 9.0  --  8.8* 9.7 8.4* 9.2 9.7  MG 2.0  --  2.0  --  1.9 2.1 2.2  PHOS 3.5  --  3.6 4.3 3.0 3.9 4.8*  < > =  values in this interval not displayed. GFR: Estimated Creatinine Clearance: 8.8 mL/min (by C-G formula based on SCr of 6.45 mg/dL (H)). Liver Function Tests:  Recent Labs Lab 05/11/16 2058 05/12/16 0340  05/14/16 0324 05/15/16 1811 05/16/16 0326 05/17/16 0358 05/18/16 0249  AST 38 45*  --   --   --   --   --   --   ALT 21 23  --   --   --   --   --   --   ALKPHOS 58 51  --   --   --   --   --   --   BILITOT 1.5* 1.0  --   --   --   --   --   --   PROT 7.6 6.3*  --   --   --   --   --   --   ALBUMIN 4.1 3.3*  < > 3.1* 3.0* 2.6* 3.2* 3.0*  < > = values in this interval not displayed. No results for input(s): LIPASE, AMYLASE in the last 168 hours. No results for input(s): AMMONIA in the last 168 hours. Coagulation Profile:  Recent Labs Lab 05/11/16 2058  INR 1.06   Cardiac Enzymes:  Recent Labs Lab 05/12/16 1315 05/12/16 1925 05/13/16 1010 05/18/16 0249 05/18/16 0735  TROPONINI 4.88* 4.74* 3.79* 0.50* 0.43*   BNP (last 3 results) No results for input(s): PROBNP in the last 8760 hours. HbA1C: No results for input(s): HGBA1C in the last 72 hours. CBG:  Recent Labs Lab 05/16/16 1606 05/17/16 0803 05/17/16 1141  05/17/16 1650 05/17/16 2213  GLUCAP 114* 130* 122* 130* 149*   Lipid Profile: No results for input(s): CHOL, HDL, LDLCALC, TRIG, CHOLHDL, LDLDIRECT in the last 72 hours. Thyroid Function Tests: No results for input(s): TSH, T4TOTAL, FREET4, T3FREE, THYROIDAB in the last 72 hours. Anemia Panel:  Recent Labs  05/15/16 1811  TIBC 238*  IRON 83   Urine analysis:    Component Value Date/Time   COLORURINE YELLOW 05/17/2016 1229   APPEARANCEUR TURBID (A) 05/17/2016 1229   LABSPEC 1.020 05/17/2016 1229   PHURINE 7.0 05/17/2016 1229   GLUCOSEU NEGATIVE 05/17/2016 1229   HGBUR LARGE (A) 05/17/2016 1229   BILIRUBINUR NEGATIVE 05/17/2016 1229   KETONESUR 15 (A) 05/17/2016 1229   PROTEINUR 100 (A) 05/17/2016 1229   UROBILINOGEN 0.2 06/12/2013 1807   NITRITE NEGATIVE 05/17/2016 1229   LEUKOCYTESUR LARGE (A) 05/17/2016 1229   Sepsis Labs: @LABRCNTIP (procalcitonin:4,lacticidven:4)  ) Recent Results (from the past 240 hour(s))  Culture, blood (Routine x 2)     Status: None   Collection Time: 05/11/16  8:58 PM  Result Value Ref Range Status   Specimen Description BLOOD RIGHT HAND  Final   Special Requests BOTTLES DRAWN AEROBIC AND ANAEROBIC 5CC  Final   Culture   Final    NO GROWTH 5 DAYS Performed at [facility]    Report Status 05/17/2016 FINAL  Final  Culture, blood (Routine x 2)     Status: None   Collection Time: 05/11/16  8:58 PM  Result Value Ref Range Status   Specimen Description BLOOD RIGHT FOREARM  Final   Special Requests IN PEDIATRIC BOTTLE 4 CC  Final   Culture   Final    NO GROWTH 5 DAYS Performed at [facility]    Report Status 05/17/2016 FINAL  Final  MRSA PCR Screening     Status: None   Collection Time: 05/12/16 11:05 AM  Result  Value Ref Range Status   MRSA by PCR NEGATIVE NEGATIVE Final    Comment:        The GeneXpert MRSA Assay (FDA approved for NASAL specimens only), is one component of a comprehensive MRSA  colonization surveillance program. It is not intended to diagnose MRSA infection nor to guide or monitor treatment for MRSA infections.   Culture, Urine     Status: Abnormal   Collection Time: 05/12/16  2:55 PM  Result Value Ref Range Status   Specimen Description URINE, CLEAN CATCH  Final   Special Requests Normal  Final   Culture MULTIPLE SPECIES PRESENT, SUGGEST RECOLLECTION (A)  Final   Report Status 05/14/2016 FINAL  Final  Culture, Urine     Status: None   Collection Time: 05/17/16 12:28 PM  Result Value Ref Range Status   Specimen Description URINE, CLEAN CATCH  Final   Special Requests NONE  Final   Culture NO GROWTH  Final   Report Status 05/18/2016 FINAL  Final         Radiology Studies: No results found.      Scheduled Meds: . midodrine      . midodrine      . aspirin  81 mg Oral Daily  . atorvastatin  80 mg Oral Daily  . calcium acetate  1,334 mg Oral TID WC  . [START ON 05/23/2016] darbepoetin (ARANESP) injection - DIALYSIS  200 mcg Intravenous Q Thu-HD  . febuxostat  20 mg Oral Daily  . midodrine  2.5 mg Oral TID WC  . multivitamin  1 tablet Oral QHS  . piperacillin-tazobactam (ZOSYN)  IV  3.375 g Intravenous Q12H  . sodium chloride flush  3 mL Intravenous Q12H  . ticagrelor  90 mg Oral BID   Continuous Infusions:   LOS: 7 days    Time spent: 40 minutes    Tulsi Crossett, Roselind MessierURTIS J, MD [facility] Pager [phone]   If 7PM-7AM, please contact night-coverage [email or web address] Password [email or web address] 05/18/2016, 11:06 AM

## 2016-05-18 NOTE — Progress Notes (Signed)
CSW called pt's dtr to discuss DC planning, no answer, CSW left VM with contact information. CSW will continue to make efforts to reach family to discuss DC planning.  Jacob Fields B. Gean QuintBrown,MSW, LCSWA Clinical Social Work Dept Weekend Social Worker [phone] 4:56 PM

## 2016-05-18 NOTE — Clinical Social Work Placement (Signed)
   CLINICAL SOCIAL WORK PLACEMENT  NOTE  Date:  05/18/2016  Patient Details  Name: Jacob Fields MRN: [id number] Date of Birth: 04-Aug-1940  Clinical Social Work is seeking post-discharge placement for this patient at the Skilled  Nursing Facility level of care (*CSW will initial, date and re-position this form in  chart as items are completed):  Yes   Patient/family provided with [facility] Clinical Social Work Department's list of facilities offering this level of care within the geographic area requested by the patient (or if unable, by the patient's family).  Yes   Patient/family informed of their freedom to choose among providers that offer the needed level of care, that participate in Medicare, Medicaid or managed care program needed by the patient, have an available bed and are willing to accept the patient.  Yes   Patient/family informed of [facility]'s ownership interest in [facility] and [facility], as well as of the fact that they are under no obligation to receive care at these facilities.  PASRR submitted to EDS on       PASRR number received on 05/18/16     Existing PASRR number confirmed on 05/18/16     FL2 transmitted to all facilities in geographic area requested by pt/family on 05/18/16     FL2 transmitted to all facilities within larger geographic area on       Patient informed that his/her managed care company has contracts with or will negotiate with certain facilities, including the following:            Patient/family informed of bed offers received.  Patient chooses bed at       Physician recommends and patient chooses bed at      Patient to be transferred to   on  .  Patient to be transferred to facility by       Patient family notified on   of transfer.  Name of family member notified:        PHYSICIAN Please sign FL2     Additional Comment:    _______________________________________________ Norlene DuelBROWN, [place] Bowlds B, LCSWA 05/18/2016, 4:03  PM

## 2016-05-18 NOTE — Progress Notes (Signed)
CRITICAL VALUE ALERT  Critical value received:  Troponin 0.50  Date of notification:  05/18/16  Time of notification:  04:31  Critical value read back: yes  Nurse who received alert:  Leda GauzeMariah Jacion Dismore  MD notified (1st page):  Allena KatzPatel  Time of first page:  04:35

## 2016-05-18 NOTE — Procedures (Signed)
I was present at this session.  I have reviewed the session itself and made appropriate changes.  HD via LUA avf.  Access prss ok. Confused. bp low 100s.  Edger Husain L 12/2/20178:19 AM

## 2016-05-18 NOTE — NC FL2 (Signed)
[place] MEDICAID FL2 LEVEL OF CARE SCREENING TOOL     IDENTIFICATION  Patient Name: Jacob MangoRobert M Eick Birthdate: 11/06/1940 Sex: male Admission Date (Current Location): 05/11/2016  [facility] [place]   Facility and Address:  [facility], [address], [place], [place] [address]      Provider Number: [id number]  Attending Physician Name and Address:  Drema Dallasurtis J Woods, MD  Relative Name and Phone Number:  Dtr-Marti [phone]    Current Level of Care: Hospital Recommended Level of Care: Skilled Nursing Facility Prior Approval Number:    Date Approved/Denied:   PASRR Number: [id number]  Discharge Plan: SNF    Current Diagnoses: Patient Active Problem List   Diagnosis Date Noted  . Subdural hematoma (HCC)   . Atrial fibrillation (HCC)   . NSTEMI (non-ST elevated myocardial infarction) (HCC)   . Chronic combined systolic and diastolic CHF (congestive heart failure) (HCC)   . 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 Septic shock (HCC) 05/11/2016  . Hypokalemia 05/11/2016  . 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 Physical deconditioning 06/14/2013  . UTI (urinary tract infection) 06/12/2013  . Acute encephalopathy 06/12/2013  . Gout attack 06/12/2013  . ESRD (end stage renal disease) on dialysis (HCC) 06/12/2013  . HTN (hypertension) 06/12/2013  . Anemia 06/12/2013  . Thrombocytopenia (HCC) 06/12/2013    Orientation RESPIRATION BLADDER Height & Weight     Self, Time, Situation, Place  Normal, O2 (2 ltrs nasal canula) Incontinent Weight: 143 lb 1.3 oz (64.9 kg) Height:  5\' 6"  (167.6 cm)  BEHAVIORAL SYMPTOMS/MOOD NEUROLOGICAL BOWEL NUTRITION STATUS  Other (Comment) (None)   Incontinent Diet (See DC Summary)  AMBULATORY STATUS COMMUNICATION OF NEEDS Skin   Limited Assist Verbally Normal                       Personal Care Assistance Level of Assistance  Bathing Bathing  Assistance: Limited assistance         Functional Limitations Info             SPECIAL CARE FACTORS FREQUENCY  PT (By licensed PT), OT (By licensed OT)     PT Frequency: 5x wk OT Frequency: 5x wk            Contractures Contractures Info: Not present    Additional Factors Info  Code Status, Allergies Code Status Info: DNR Allergies Info: Plavix Clopidogrel           Current Medications (05/18/2016):  This is the current hospital active medication list Current Facility-Administered Medications  Medication Dose Route Frequency Provider Last Rate Last Dose  . midodrine (PROAMATINE) 5 MG tablet           . midodrine (PROAMATINE) 5 MG tablet           . 0.9 %  sodium chloride infusion  250 mL Intravenous PRN Jamie KatoAaron Trimble, MD      . acetaminophen (TYLENOL) tablet 650 mg  650 mg Oral Q6H PRN Alberteen Samhristopher P Danford, MD       Or  . acetaminophen (TYLENOL) suppository 650 mg  650 mg Rectal Q6H PRN Alberteen Samhristopher P Danford, MD      . aspirin chewable tablet 81 mg  81 mg Oral Daily Yates DecampJay Ganji, MD   81 mg at 05/18/16 1229  . atorvastatin (LIPITOR) tablet 80 mg  80 mg Oral Daily Alberteen Samhristopher P Danford, MD  80 mg at 05/18/16 1230  . calamine lotion   Topical PRN Rolly SalterPranav M Patel, MD   1 application at 05/18/16 0247  . calcium acetate (PHOSLO) capsule 1,334 mg  1,334 mg Oral TID WC Nelda Bucksaniel J Feinstein, MD   1,334 mg at 05/18/16 1233  . [START ON 05/23/2016] Darbepoetin Alfa (ARANESP) injection 200 mcg  200 mcg Intravenous Q Thu-HD Drema Dallasurtis J Woods, MD      . febuxostat (ULORIC) tablet 20 mg  20 mg Oral Daily Drema Dallasurtis J Woods, MD   20 mg at 05/18/16 1238  . hydrOXYzine (ATARAX/VISTARIL) tablet 25 mg  25 mg Oral TID PRN Rolly SalterPranav M Patel, MD   25 mg at 05/17/16 2225  . midodrine (PROAMATINE) tablet 2.5 mg  2.5 mg Oral TID WC Simonne MartinetPeter E Babcock, NP   2.5 mg at 05/18/16 1232  . multivitamin (RENA-VIT) tablet 1 tablet  1 tablet Oral QHS Weston SettleMartha Bergman, PA-C   1 tablet at 05/17/16 2120  . ondansetron  (ZOFRAN) tablet 4 mg  4 mg Oral Q6H PRN Alberteen Samhristopher P Danford, MD       Or  . ondansetron (ZOFRAN) injection 4 mg  4 mg Intravenous Q6H PRN Alberteen Samhristopher P Danford, MD      . sodium chloride flush (NS) 0.9 % injection 3 mL  3 mL Intravenous Q12H Alberteen Samhristopher P Danford, MD   3 mL at 05/18/16 1240  . ticagrelor (BRILINTA) tablet 90 mg  90 mg Oral BID Yates DecampJay Ganji, MD   90 mg at 05/18/16 1231  . traZODone (DESYREL) tablet 25 mg  25 mg Oral QHS PRN Drema Dallasurtis J Woods, MD         Discharge Medications: Please see discharge summary for a list of discharge medications.  Relevant Imaging Results:  Relevant Lab Results:   Additional Information    Nori Poland B, LCSWA

## 2016-05-19 DIAGNOSIS — G4709 Other insomnia: Secondary | ICD-10-CM

## 2016-05-19 LAB — MAGNESIUM: Magnesium: 2 mg/dL (ref 1.7–2.4)

## 2016-05-19 LAB — CBC WITH DIFFERENTIAL/PLATELET
Basophils Absolute: 0 10*3/uL (ref 0.0–0.1)
Basophils Relative: 0 %
Eosinophils Absolute: 0.1 10*3/uL (ref 0.0–0.7)
Eosinophils Relative: 1 %
HEMATOCRIT: 28.5 % — AB (ref 39.0–52.0)
HEMOGLOBIN: 10.2 g/dL — AB (ref 13.0–17.0)
LYMPHS PCT: 11 %
Lymphs Abs: 1.1 10*3/uL (ref 0.7–4.0)
MCH: 38.1 pg — ABNORMAL HIGH (ref 26.0–34.0)
MCHC: 35.8 g/dL (ref 30.0–36.0)
MCV: 106.3 fL — AB (ref 78.0–100.0)
MONO ABS: 0.6 10*3/uL (ref 0.1–1.0)
MONOS PCT: 6 %
NEUTROS ABS: 8.3 10*3/uL — AB (ref 1.7–7.7)
Neutrophils Relative %: 82 %
Platelets: 179 10*3/uL (ref 150–400)
RBC: 2.68 MIL/uL — ABNORMAL LOW (ref 4.22–5.81)
RDW: 14.1 % (ref 11.5–15.5)
WBC: 10.1 10*3/uL (ref 4.0–10.5)

## 2016-05-19 LAB — BASIC METABOLIC PANEL
ANION GAP: 15 (ref 5–15)
BUN: 20 mg/dL (ref 6–20)
CALCIUM: 8.9 mg/dL (ref 8.9–10.3)
CHLORIDE: 93 mmol/L — AB (ref 101–111)
CO2: 25 mmol/L (ref 22–32)
Creatinine, Ser: 4.3 mg/dL — ABNORMAL HIGH (ref 0.61–1.24)
GFR calc Af Amer: 14 mL/min — ABNORMAL LOW (ref >60)
GFR calc non Af Amer: 12 mL/min — ABNORMAL LOW (ref >60)
GLUCOSE: 167 mg/dL — AB (ref 65–99)
Potassium: 3.4 mmol/L — ABNORMAL LOW (ref 3.5–5.1)
Sodium: 133 mmol/L — ABNORMAL LOW (ref 135–145)

## 2016-05-19 LAB — IRON AND TIBC
Iron: 82 ug/dL (ref 45–182)
SATURATION RATIOS: 27 % (ref 17.9–39.5)
TIBC: 300 ug/dL (ref 250–450)
UIBC: 218 ug/dL

## 2016-05-19 LAB — RENAL FUNCTION PANEL
ANION GAP: 15 (ref 5–15)
Albumin: 3.2 g/dL — ABNORMAL LOW (ref 3.5–5.0)
BUN: 21 mg/dL — ABNORMAL HIGH (ref 6–20)
CHLORIDE: 94 mmol/L — AB (ref 101–111)
CO2: 25 mmol/L (ref 22–32)
Calcium: 9.1 mg/dL (ref 8.9–10.3)
Creatinine, Ser: 4.35 mg/dL — ABNORMAL HIGH (ref 0.61–1.24)
GFR calc Af Amer: 14 mL/min — ABNORMAL LOW (ref >60)
GFR calc non Af Amer: 12 mL/min — ABNORMAL LOW (ref >60)
GLUCOSE: 170 mg/dL — AB (ref 65–99)
POTASSIUM: 3.3 mmol/L — AB (ref 3.5–5.1)
Phosphorus: 4.4 mg/dL (ref 2.5–4.6)
Sodium: 134 mmol/L — ABNORMAL LOW (ref 135–145)

## 2016-05-19 LAB — RETICULOCYTES
RBC.: 2.68 MIL/uL — AB (ref 4.22–5.81)
Retic Count, Absolute: 80.4 10*3/uL (ref 19.0–186.0)
Retic Ct Pct: 3 % (ref 0.4–3.1)

## 2016-05-19 LAB — FOLATE: FOLATE: 38.6 ng/mL (ref >5.9)

## 2016-05-19 LAB — VITAMIN B12: Vitamin B-12: 526 pg/mL (ref 180–914)

## 2016-05-19 LAB — FERRITIN: Ferritin: 1167 ng/mL — ABNORMAL HIGH (ref 24–336)

## 2016-05-19 LAB — GLUCOSE, CAPILLARY: Glucose-Capillary: 104 mg/dL — ABNORMAL HIGH (ref 65–99)

## 2016-05-19 MED ORDER — POTASSIUM CHLORIDE CRYS ER 20 MEQ PO TBCR
20.0000 meq | EXTENDED_RELEASE_TABLET | Freq: Once | ORAL | Status: DC
  Filled 2016-05-19: qty 1

## 2016-05-19 NOTE — Progress Notes (Signed)
Report called to [facility] RN on 6E. Patient going to room 13. All belongings are accounted for. Family has been notified.

## 2016-05-19 NOTE — Progress Notes (Signed)
CSW received return call from pt's dtr Marti who reports thDorothyann Gibbse family would like pt to go to [facility]. [facility] has not accepted pt to date, will provide family with bed offers to date. CSW will remain available for pt/family for support and to coordinate pt's transfer to SNF.  Ardyce Heyer B. Gean QuintBrown,MSW, LCSWA Clinical Social Work Dept Weekend Social Worker [phone] 5:15 PM

## 2016-05-19 NOTE — Progress Notes (Signed)
Patient clearly confused this morning. States that he would like to know when he is getting out of jail as we have him chained to the chair. Also refused to take any oral medications stating " my stomach is not conducive to taking medications this morning."

## 2016-05-19 NOTE — Progress Notes (Signed)
First attempt to call report to 6E. Patient going to room 13.

## 2016-05-19 NOTE — Progress Notes (Signed)
Subjective: Interval History: has complaints not DM not sure why accucheks.  Objective: Vital signs in last 24 hours: Temp:  [97.3 F (36.3 C)-98.5 F (36.9 C)] 97.8 F (36.6 C) (12/03 0754) Pulse Rate:  [29-97] 82 (12/03 0754) Resp:  [13-22] 18 (12/03 0754) BP: (81-151)/(24-76) 102/56 (12/03 0754) SpO2:  [77 %-100 %] 100 % (12/03 0754) Weight:  [64.1 kg (141 lb 4.8 oz)-64.9 kg (143 lb 1.3 oz)] 64.1 kg (141 lb 4.8 oz) (12/03 0318) Weight change: 0.353 kg (12.5 oz)  Intake/Output from previous day: 12/02 0701 - 12/03 0700 In: 410 [P.O.:360; IV Piggyback:50] Out: 763 [Urine:45; Stool:655] Intake/Output this shift: No intake/output data recorded.  General appearance: alert, cooperative and no distress Resp: rales bibasilar Cardio: S1, S2 normal and systolic murmur: holosystolic 2/6, blowing at apex GI: pos bs, soft, liver down 5 cm Extremities: avf B&T  Lab Results:  Recent Labs  05/18/16 0249 05/19/16 0155  WBC 10.3 10.1  HGB 10.2* 10.2*  HCT 28.8* 28.5*  PLT 171 179   BMET:  Recent Labs  05/18/16 0249 05/19/16 0155  NA 135 133*  134*  K 4.1 3.4*  3.3*  CL 98* 93*  94*  CO2 22 25  25   GLUCOSE 123* 167*  170*  BUN 45* 20  21*  CREATININE 6.45* 4.30*  4.35*  CALCIUM 9.7 8.9  9.1   No results for input(s): PTH in the last 72 hours. Iron Studies:  Recent Labs  05/19/16 0155  IRON 82  TIBC 300  FERRITIN 1,167*    Studies/Results: No results found.  I have reviewed the patient's current medications.  Assessment/Plan: 1 ESRD for HD TTS 2 Sundowning 3 Anemia stable 4 HPTH vit D 5 CAD 6 Septic syndrome resolved P NHP, HD,     LOS: 8 days   Tajah Schreiner L 05/19/2016,8:02 AM

## 2016-05-19 NOTE — Progress Notes (Signed)
PROGRESS NOTE    Jacob Fields  XBJ:[id number] DOB: Nov 13, 1940 DOA: 05/11/2016 PCP: No primary care provider on file.   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  The patient [place], [place]. He is up here to visit his daughter for the holiday.  Over the last week or more, son notes his father has complained of malaise.    Then today, the patient had his Friday dialysis treatment as a guest at a center in [place]. Afterwards, he was weak, febrile and had decreased responsiveness, so family called 9-1-1.  EMS found him altered, febrile, with chills and administered acetaminophen en route.   Subjective: 12/3 A/O 4, NAD. Observed patient ambulating around the ward with walker. Still one-person assist. Patient still with some short-term memory loss would consistently ask plan of care and then 5 minutes later ask same question again.       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 -Blood and urine cultures NGTD  ESRD on HD T/Th/S:  -Continue Phoslo -Spoke with Dr. Fayrene FearingJames [facility] who has agreed to dialyze patient on Saturday and arrange for outpatient HD next week  New Atrial Fibrillation(CHADS2Vasc 4) -Currently rate controlled.. -Per cardiology Dr. Yates DecampJay Ganji  needs to be evaluated for "Watchman  device" for non valvular atrial fibrillation with contraindication to long term anticoagulation. I have advised him to discuss this further with his cardiologist in [place], [place].  -Patient and [place] decided [place].   NSTEMI/Chronic Systolic and Diastolic CHF  -Per patient and family has had a workup in the past and was informed needed a CABG but would not survive surgery. -Spoke with Dr. Yates DecampJay Ganji Cardiology who has agreed to see patient on Thursday 7 December 14 30 at follow-up.   Gout:  -Uloric 20 mg daily  History of stroke:  -Continue aspirin 325, statin  Ulcerative colitis:  -WOC consult  Other medications:  -Hold Amitriptyline 75 mg QHS   SDH -Occurred~2015 therefore not appropriate for long-term anticoagulation  Anemia Chronic Disease? -Most likely chronic disease, but will obtain anemia panel  Insomnia/sundowning -Trazodone 25 mg QHS. Started on this admission titrate for effect.  Hypokalemia -Potassium goal> 4 -K Dur 20 mEq     Goals of care -Family meeting today with children and patient. Patient agrees it would be in his best interest [place] for definitive care of his cardiac issues. Have arranged follow-up with Dr. Yates DecampJay Ganji -Also notified Dr. Fayrene FearingJames [facility] in order to arrange for HD. -PT has recommended SNF and patient has agreed.  -12/2 spoke with LCSW Mitzi DavenportShelby [phone] who has begun work on finding SNF for patient.  -Family has requested [facility]. Awaiting updated PT/OT evaluation in order to complete paperwork     DVT prophylaxis: SCD Code  Status: DO NOT RESUSCITATE Family Communication: None Disposition Plan:   Consultants:  Dr. [name] Dr. [name] [facility]    Procedures/Significant Events:  11/26 Echocardiogram: LVEF = 40%. Hypokinesis of the basal-midinferolateral and inferior myocardium.  -(grade 2 diastolic  dysfunction). - Aortic valve: mild to moderate stenosis.     VENTILATOR SETTINGS:    Cultures 11/25 blood 2 NGTD 11/26 MRSA by PCR negative 11/26 urine positive multiple species 12/1 urine negative   Antimicrobials: Zosyn 11/25>>11/30 Vancomycin 11/25>> 11/28    Devices    LINES / TUBES:      Continuous Infusions:   Objective: Vitals:   05/19/16 0221 05/19/16 0318 05/19/16 0700 05/19/16 0754  BP:  109/61  (!) 102/56  Pulse: 72 83 86 82  Resp: 14 16 17 18   Temp:  97.6 F (36.4 C)  97.8 F (36.6 C)  TempSrc:  Oral  Oral  SpO2: 100% 100% 90% 100%  Weight:  64.1 kg (141 lb 4.8 oz)    Height:        Intake/Output Summary (Last 24 hours) at 05/19/16 1157 Last data filed at 05/19/16 0600  Gross per 24 hour  Intake              410 ml  Output              700 ml  Net             -290 ml   Filed Weights   05/18/16 0720 05/18/16 1130 05/19/16 0318  Weight: 64.9 kg (143 lb 1.3 oz) 64.9 kg (143 lb 1.3 oz) 64.1 kg (141 lb 4.8 oz)    Examination:  General: A/O 4, positive acute respiratory distress Eyes: negative scleral hemorrhage, negative anisocoria, negative icterus ENT: Negative Runny nose, negative gingival bleeding, Neck:  Negative scars, masses, torticollis, lymphadenopathy, JVD Lungs: Clear to auscultation bilaterally without wheezes or crackles Cardiovascular: Irregular irregular rhythm and rate, without murmur gallop or rub normal S1 and S2 Abdomen: negative abdominal pain, nondistended, positive soft, bowel sounds, no rebound, no ascites, no appreciable mass Extremities: No significant cyanosis, clubbing, or edema bilateral lower extremities Skin: Negative rashes, lesions, ulcers Psychiatric:  Negative depression, negative anxiety, negative fatigue, negative mania  Central nervous system:  Cranial nerves II through XII intact, tongue/uvula midline, all extremities muscle strength 5/5, sensation intact throughout, negative dysarthria, negative  expressive aphasia, negative receptive aphasia.  .     Data Reviewed: Care during the described time interval was provided by me .  I have reviewed this patient's available data, including medical history, events of note, physical examination, and all test results as part of my evaluation. I have personally reviewed and interpreted all radiology studies.  CBC:  Recent Labs Lab 05/14/16 0324 05/15/16 1811 05/16/16 0326 05/17/16 0358 05/18/16 0249 05/19/16 0155  WBC 4.5 6.0 6.5 10.5 10.3 10.1  NEUTROABS 3.3  --  5.1 7.5 8.2* 8.3*  HGB 7.9* 10.0* 9.4* 10.9* 10.2* 10.2*  HCT 22.5* 28.7* 26.5* 31.2* 28.8* 28.5*  MCV 109.8* 108.3* 105.6* 107.2* 105.1* 106.3*  PLT 77* 101* 103* 149* 171 179   Basic Metabolic Panel:  Recent Labs Lab 05/14/16 0324 05/15/16 1811 05/16/16 0326 05/17/16 0358 05/18/16 0249 05/19/16 0155  NA 134* 135 139 136 135 133*  134*  K 4.4 4.9 3.3* 4.1 4.1 3.4*  3.3*  CL 107 109 97* 96* 98* 93*  94*  CO2 17* 15* 26 23 22 25  25   GLUCOSE 110* 172* 147* 137*  123* 167*  170*  BUN 49* 53* 20 24* 45* 20  21*  CREATININE 7.57* 8.62* 4.39* 4.31* 6.45* 4.30*  4.35*  CALCIUM 8.8* 9.7 8.4* 9.2 9.7 8.9  9.1  MG 2.0  --  1.9 2.1 2.2 2.0  PHOS 3.6 4.3 3.0 3.9 4.8* 4.4   GFR: Estimated Creatinine Clearance: 13 mL/min (by C-G formula based on SCr of 4.35 mg/dL (H)). Liver Function Tests:  Recent Labs Lab 05/15/16 1811 05/16/16 0326 05/17/16 0358 05/18/16 0249 05/19/16 0155  ALBUMIN 3.0* 2.6* 3.2* 3.0* 3.2*   No results for input(s): LIPASE, AMYLASE in the last 168 hours. No results for input(s): AMMONIA in the last 168 hours. Coagulation Profile: No results for input(s): INR, PROTIME in the last 168 hours. Cardiac Enzymes:  Recent Labs Lab 05/12/16 1925 05/13/16 1010 05/18/16 0249 05/18/16 0735 05/18/16 1512  TROPONINI 4.74* 3.79* 0.50* 0.43* 0.40*   BNP (last 3 results) No results for input(s): PROBNP in the last 8760 hours. HbA1C: No  results for input(s): HGBA1C in the last 72 hours. CBG:  Recent Labs Lab 05/17/16 2213 05/18/16 1217 05/18/16 1634 05/18/16 2118 05/19/16 0800  GLUCAP 149* 99 120* 96 104*   Lipid Profile: No results for input(s): CHOL, HDL, LDLCALC, TRIG, CHOLHDL, LDLDIRECT in the last 72 hours. Thyroid Function Tests: No results for input(s): TSH, T4TOTAL, FREET4, T3FREE, THYROIDAB in the last 72 hours. Anemia Panel:  Recent Labs  05/19/16 0155  VITAMINB12 526  FOLATE 38.6  FERRITIN 1,167*  TIBC 300  IRON 82  RETICCTPCT 3.0   Urine analysis:    Component Value Date/Time   COLORURINE YELLOW 05/17/2016 1229   APPEARANCEUR TURBID (A) 05/17/2016 1229   LABSPEC 1.020 05/17/2016 1229   PHURINE 7.0 05/17/2016 1229   GLUCOSEU NEGATIVE 05/17/2016 1229   HGBUR LARGE (A) 05/17/2016 1229   BILIRUBINUR NEGATIVE 05/17/2016 1229   KETONESUR 15 (A) 05/17/2016 1229   PROTEINUR 100 (A) 05/17/2016 1229   UROBILINOGEN 0.2 06/12/2013 1807   NITRITE NEGATIVE 05/17/2016 1229   LEUKOCYTESUR LARGE (A) 05/17/2016 1229   Sepsis Labs: @LABRCNTIP (procalcitonin:4,lacticidven:4)  ) Recent Results (from the past 240 hour(s))  Culture, blood (Routine x 2)     Status: None   Collection Time: 05/11/16  8:58 PM  Result Value Ref Range Status   Specimen Description BLOOD RIGHT HAND  Final   Special Requests BOTTLES DRAWN AEROBIC AND ANAEROBIC 5CC  Final   Culture   Final    NO GROWTH 5 DAYS Performed at [facility]    Report Status 05/17/2016 FINAL  Final  Culture, blood (Routine x 2)     Status: None   Collection Time: 05/11/16  8:58 PM  Result Value Ref Range Status   Specimen Description BLOOD RIGHT FOREARM  Final   Special Requests IN PEDIATRIC BOTTLE 4 CC  Final   Culture   Final    NO GROWTH 5 DAYS Performed at [facility]     Report Status 05/17/2016 FINAL  Final  MRSA PCR Screening     Status: None   Collection Time: 05/12/16 11:05 AM  Result Value Ref Range Status   MRSA  by PCR NEGATIVE NEGATIVE Final    Comment:        The GeneXpert MRSA Assay (FDA approved for NASAL specimens only), is one component of a comprehensive MRSA colonization surveillance program. It is not intended to diagnose MRSA infection nor to guide or monitor treatment for MRSA infections.   Culture, Urine  Status: Abnormal   Collection Time: 05/12/16  2:55 PM  Result Value Ref Range Status   Specimen Description URINE, CLEAN CATCH  Final   Special Requests Normal  Final   Culture MULTIPLE SPECIES PRESENT, SUGGEST RECOLLECTION (A)  Final   Report Status 05/14/2016 FINAL  Final  Culture, Urine     Status: None   Collection Time: 05/17/16 12:28 PM  Result Value Ref Range Status   Specimen Description URINE, CLEAN CATCH  Final   Special Requests NONE  Final   Culture NO GROWTH  Final   Report Status 05/18/2016 FINAL  Final         Radiology Studies: No results found.      Scheduled Meds: . aspirin  81 mg Oral Daily  . atorvastatin  80 mg Oral Daily  . calcium acetate  1,334 mg Oral TID WC  . [START ON 05/23/2016] darbepoetin (ARANESP) injection - DIALYSIS  200 mcg Intravenous Q Thu-HD  . febuxostat  20 mg Oral Daily  . midodrine  2.5 mg Oral TID WC  . multivitamin  1 tablet Oral QHS  . potassium chloride  20 mEq Oral Once  . sodium chloride flush  3 mL Intravenous Q12H  . ticagrelor  90 mg Oral BID   Continuous Infusions:   LOS: 8 days    Time spent: 40 minutes    Damont Balles, Roselind Messier, MD [facility] Pager [phone]   If 7PM-7AM, please contact night-coverage [email or web address] Password [email or web address] 05/19/2016, 11:57 AM

## 2016-05-20 LAB — MAGNESIUM: MAGNESIUM: 2.2 mg/dL (ref 1.7–2.4)

## 2016-05-20 LAB — RENAL FUNCTION PANEL
ALBUMIN: 3.6 g/dL (ref 3.5–5.0)
ANION GAP: 18 — AB (ref 5–15)
BUN: 40 mg/dL — ABNORMAL HIGH (ref 6–20)
CALCIUM: 9.2 mg/dL (ref 8.9–10.3)
CO2: 20 mmol/L — ABNORMAL LOW (ref 22–32)
Chloride: 95 mmol/L — ABNORMAL LOW (ref 101–111)
Creatinine, Ser: 7.01 mg/dL — ABNORMAL HIGH (ref 0.61–1.24)
GFR calc non Af Amer: 7 mL/min — ABNORMAL LOW (ref >60)
GFR, EST AFRICAN AMERICAN: 8 mL/min — AB (ref >60)
Glucose, Bld: 132 mg/dL — ABNORMAL HIGH (ref 65–99)
PHOSPHORUS: 6.1 mg/dL — AB (ref 2.5–4.6)
Potassium: 3.7 mmol/L (ref 3.5–5.1)
SODIUM: 133 mmol/L — AB (ref 135–145)

## 2016-05-20 LAB — CBC WITH DIFFERENTIAL/PLATELET
BASOS ABS: 0 10*3/uL (ref 0.0–0.1)
BASOS PCT: 0 %
Eosinophils Absolute: 0.2 10*3/uL (ref 0.0–0.7)
Eosinophils Relative: 2 %
HEMATOCRIT: 30.7 % — AB (ref 39.0–52.0)
HEMOGLOBIN: 10.7 g/dL — AB (ref 13.0–17.0)
Lymphocytes Relative: 14 %
Lymphs Abs: 1.2 10*3/uL (ref 0.7–4.0)
MCH: 37 pg — ABNORMAL HIGH (ref 26.0–34.0)
MCHC: 34.9 g/dL (ref 30.0–36.0)
MCV: 106.2 fL — ABNORMAL HIGH (ref 78.0–100.0)
Monocytes Absolute: 0.6 10*3/uL (ref 0.1–1.0)
Monocytes Relative: 7 %
NEUTROS ABS: 6.9 10*3/uL (ref 1.7–7.7)
NEUTROS PCT: 77 %
Platelets: 207 10*3/uL (ref 150–400)
RBC: 2.89 MIL/uL — AB (ref 4.22–5.81)
RDW: 13.9 % (ref 11.5–15.5)
WBC: 8.9 10*3/uL (ref 4.0–10.5)

## 2016-05-20 NOTE — Care Management Important Message (Signed)
Important Message  Patient Details  Name: Jacob MangoRobert M Fields MRN: [id number] Date of Birth: 09-22-1940   Medicare Important Message Given:  Yes    [name] 05/20/2016, 11:57 AM

## 2016-05-20 NOTE — Progress Notes (Signed)
Earlier in the night, the patient was worried that he had the beginnings of "sleep apnea".  When further questioned, he felt he went into a "trance" and was afraid to sleep at times.  He could not be more specific.  He stated this had been occurring for several months and that he had not told any one about it until now.  VS stable.  No obvious neuro deficits except memory impairment and impulsiveness.  This morning, patient is unsure of where he is at and not sure as to why he is here.  Reoriented patient.    Also, patient's daughter call this am.  If possible, she wants her father set up at the [facility] [address] [facility] hemod[facility] center in [place].  They can either do 6 am or 11:30 am time slots.  I advised that I would advise his treatment team of the request.  Will continue to monitor patient.  Bernie CoveyKimberly Clarisse Rodriges RN-BC, [employer]

## 2016-05-20 NOTE — Progress Notes (Signed)
PROGRESS NOTE [facility]   Jacob MangoRobert M Fields   ZOX:[id number] DOB: 1940/09/02  DOA: 05/11/2016 PCP: No primary care provider on file.   Brief Narrative:  75 year old male with end-stage renal disease on dialysis, ulcerative colitis, status post colectomy with end ileostomy, CVA, hypertension. [place]. Presented to the ED with altered mental status, fever and chills after completing dialysis. Patient was admitted to the ICU for septic shock started on vasopressors. Course was complicated with a NSTEMI 2/2 demand ischemia. Development A. fib. Patient was treated medically given poor clinical condition. Transferred to the floor on 11/29. I waiting for nursing home placement and dialysis outpatient setting given patient is moving to the [place] area.  Subjective: Patient seen and examined, no acute events overnight patient reported he is feeling pretty well and is back to baseline. He is alert and oriented 3. Remains afebrile. No complaints  Assessment & Plan:  Septic shock, unclear source:- Sepsis physiology has resolved Urine?   Continue Zosyn for now and discontinue tomorrow in the morning Blood and urine cultures NGTD  ESRD on HD T/Th/S: Management per nephrology  New atrial fibrillation(CHADS2Vasc 4) -rate control Per cardiology Dr. Yates DecampJay Ganji  needs to be evaluated for "Watchman device" for non valvular atrial fibrillation with contraindication to long term anticoagulation. I have advised him to discuss this further with his cardiologist in [place].  NSTEMI/Chronic Systolic and Diastolic CHF  Patient will need a coronary angiogram, will be done as an outpatient per Dr. Jacinto HalimGanji  Patient on mido, aspirin and brillinta  Gout: Uloric  History of stroke: ASA  Ulcerative colitis: WOC consult appreciated  DVT prophylaxis: SCD Code Status: DO NOT RESUSCITATE Family Communication: None Disposition Plan: ?  Consultants:     Cardiology - Dr Jacinto HalimGanji   Nephrology - [facility]   Procedures:  11/26 Echocardiogram: LVEF = 40%. Hypokinesis of the basal-midinferolateral and inferior myocardium.  -(grade 2 diastolicdysfunction). - Aortic valve: mild to moderate stenosis.  Antimicrobials: Zosyn 11/26>> Vancomycin 11/25>> 11/28   Objective: Vitals:   05/20/16 0404 05/20/16 0631 05/20/16 1000 05/20/16 1754  BP:  117/62 131/80 131/76  Pulse:  73 70 90  Resp:  18 18 18   Temp:  98.1 F (36.7 C) 98 F (36.7 C) 98.4 F (36.9 C)  TempSrc:  Oral Oral Oral  SpO2:  100% 96% 99%  Weight: 64.4 kg (141 lb 15.6 oz)     Height:        Intake/Output Summary (Last 24 hours) at 05/20/16 1900 Last data filed at 05/20/16 1543  Gross per 24 hour  Intake              600 ml  Output              855 ml  Net             -255 ml   Filed Weights   05/19/16 0318 05/19/16 2036 05/20/16 0404  Weight: 64.1 kg (141 lb 4.8 oz) 64.4 kg (142 lb) 64.4 kg (141 lb 15.6 oz)    Examination:  General exam: Appears calm and comfortable  Respiratory system: Clear to auscultation. No wheezes,crackle or rhonchi Cardiovascular system: S1 & S2 heard, Irr Irr. No JVD, murmurs, rubs or gallops Gastrointestinal system: Abdomen is nondistended, soft and nontender. Central nervous system: Alert and oriented. No focal neurological deficits. Extremities: No pedal edema.   Skin: No rashes, lesions or ulcers Psychiatry: Judgement and insight appear normal. Mood & affect appropriate.  Data Reviewed: I have personally reviewed following labs and imaging studies  CBC:  Recent Labs Lab 05/16/16 0326 05/17/16 0358 05/18/16 0249 05/19/16 0155 05/20/16 0608  WBC 6.5 10.5 10.3 10.1 8.9  NEUTROABS 5.1 7.5 8.2* 8.3* 6.9  HGB 9.4* 10.9* 10.2* 10.2* 10.7*  HCT 26.5* 31.2* 28.8* 28.5* 30.7*  MCV 105.6* 107.2* 105.1* 106.3* 106.2*  PLT 103* 149* 171 179 207   Basic Metabolic Panel:  Recent Labs Lab 05/16/16 0326 05/17/16 0358  05/18/16 0249 05/19/16 0155 05/20/16 0608  NA 139 136 135 133*  134* 133*  K 3.3* 4.1 4.1 3.4*  3.3* 3.7  CL 97* 96* 98* 93*  94* 95*  CO2 26 23 22 25  25  20*  GLUCOSE 147* 137* 123* 167*  170* 132*  BUN 20 24* 45* 20  21* 40*  CREATININE 4.39* 4.31* 6.45* 4.30*  4.35* 7.01*  CALCIUM 8.4* 9.2 9.7 8.9  9.1 9.2  MG 1.9 2.1 2.2 2.0 2.2  PHOS 3.0 3.9 4.8* 4.4 6.1*   GFR: Estimated Creatinine Clearance: 8.1 mL/min (by C-G formula based on SCr of 7.01 mg/dL (H)). Liver Function Tests:  Recent Labs Lab 05/16/16 0326 05/17/16 0358 05/18/16 0249 05/19/16 0155 05/20/16 0608  ALBUMIN 2.6* 3.2* 3.0* 3.2* 3.6   No results for input(s): LIPASE, AMYLASE in the last 168 hours. No results for input(s): AMMONIA in the last 168 hours. Coagulation Profile: No results for input(s): INR, PROTIME in the last 168 hours. Cardiac Enzymes:  Recent Labs Lab 05/18/16 0249 05/18/16 0735 05/18/16 1512  TROPONINI 0.50* 0.43* 0.40*   BNP (last 3 results) No results for input(s): PROBNP in the last 8760 hours. HbA1C: No results for input(s): HGBA1C in the last 72 hours. CBG:  Recent Labs Lab 05/17/16 2213 05/18/16 1217 05/18/16 1634 05/18/16 2118 05/19/16 0800  GLUCAP 149* 99 120* 96 104*   Lipid Profile: No results for input(s): CHOL, HDL, LDLCALC, TRIG, CHOLHDL, LDLDIRECT in the last 72 hours. Thyroid Function Tests: No results for input(s): TSH, T4TOTAL, FREET4, T3FREE, THYROIDAB in the last 72 hours. Anemia Panel:  Recent Labs  05/19/16 0155  VITAMINB12 526  FOLATE 38.6  FERRITIN 1,167*  TIBC 300  IRON 82  RETICCTPCT 3.0   Sepsis Labs: No results for input(s): PROCALCITON, LATICACIDVEN in the last 168 hours.  Recent Results (from the past 240 hour(s))  Culture, blood (Routine x 2)     Status: None   Collection Time: 05/11/16  8:58 PM  Result Value Ref Range Status   Specimen Description BLOOD RIGHT HAND  Final   Special Requests BOTTLES DRAWN AEROBIC AND  ANAEROBIC 5CC  Final   Culture   Final    NO GROWTH 5 DAYS Performed at [facility]    Report Status 05/17/2016 FINAL  Final  Culture, blood (Routine x 2)     Status: None   Collection Time: 05/11/16  8:58 PM  Result Value Ref Range Status   Specimen Description BLOOD RIGHT FOREARM  Final   Special Requests IN PEDIATRIC BOTTLE 4 CC  Final   Culture   Final    NO GROWTH 5 DAYS Performed at [facility]    Report Status 05/17/2016 FINAL  Final  MRSA PCR Screening     Status: None   Collection Time: 05/12/16 11:05 AM  Result Value Ref Range Status   MRSA by PCR NEGATIVE NEGATIVE Final    Comment:        The GeneXpert MRSA Assay (FDA approved  for NASAL specimens only), is one component of a comprehensive MRSA colonization surveillance program. It is not intended to diagnose MRSA infection nor to guide or monitor treatment for MRSA infections.   Culture, Urine     Status: Abnormal   Collection Time: 05/12/16  2:55 PM  Result Value Ref Range Status   Specimen Description URINE, CLEAN CATCH  Final   Special Requests Normal  Final   Culture MULTIPLE SPECIES PRESENT, SUGGEST RECOLLECTION (A)  Final   Report Status 05/14/2016 FINAL  Final  Culture, Urine     Status: None   Collection Time: 05/17/16 12:28 PM  Result Value Ref Range Status   Specimen Description URINE, CLEAN CATCH  Final   Special Requests NONE  Final   Culture NO GROWTH  Final   Report Status 05/18/2016 FINAL  Final         Radiology Studies: No results found.    Scheduled Meds: . aspirin  81 mg Oral Daily  . atorvastatin  80 mg Oral Daily  . calcium acetate  1,334 mg Oral TID WC  . [START ON 05/23/2016] darbepoetin (ARANESP) injection - DIALYSIS  200 mcg Intravenous Q Thu-HD  . febuxostat  20 mg Oral Daily  . midodrine  2.5 mg Oral TID WC  . multivitamin  1 tablet Oral QHS  . potassium chloride  20 mEq Oral Once  . sodium chloride flush  3 mL Intravenous Q12H  . ticagrelor  90 mg  Oral BID   Continuous Infusions:   LOS: 9 days    Latrelle Dodrill, MD [facility] Pager [phone]  If 7PM-7AM, please contact night-coverage [email or web address] Password [email or web address] 05/20/2016, 7:00 PM

## 2016-05-20 NOTE — Progress Notes (Signed)
Patient ID: Jacob Fields, male   DOB: [birth year], 75 y.o.   MRN: [id number]  [facility] Progress Note    Subjective:   No complaints   Objective:   BP 131/80 (BP Location: Right Arm)   Pulse 70   Temp 98 F (36.7 C) (Oral)   Resp 18   Ht 5\' 6"  (1.676 m)   Wt 64.4 kg (141 lb 15.6 oz)   SpO2 96%   BMI 22.92 kg/m   Intake/Output: I/O last 3 completed shifts: In: 960 [P.O.:960] Out: 825 [Urine:100; Stool:725]   Intake/Output this shift:  Total I/O In: 240 [P.O.:240] Out: 0  Weight change: -0.489 kg (-1 lb 1.3 oz)  Physical Exam: WUJ:WJXBJGen:frail elderly wm in nad CVS:no rub Resp:cta YNW:GNFAOZAbd:benign Ext:LUE AVG +T/B  Labs: BMET  Recent Labs Lab 05/14/16 0324 05/15/16 1811 05/16/16 0326 05/17/16 0358 05/18/16 0249 05/19/16 0155 05/20/16 0608  NA 134* 135 139 136 135 133*  134* 133*  K 4.4 4.9 3.3* 4.1 4.1 3.4*  3.3* 3.7  CL 107 109 97* 96* 98* 93*  94* 95*  CO2 17* 15* 26 23 22 25  25  20*  GLUCOSE 110* 172* 147* 137* 123* 167*  170* 132*  BUN 49* 53* 20 24* 45* 20  21* 40*  CREATININE 7.57* 8.62* 4.39* 4.31* 6.45* 4.30*  4.35* 7.01*  ALBUMIN 3.1* 3.0* 2.6* 3.2* 3.0* 3.2* 3.6  CALCIUM 8.8* 9.7 8.4* 9.2 9.7 8.9  9.1 9.2  PHOS 3.6 4.3 3.0 3.9 4.8* 4.4 6.1*   CBC  Recent Labs Lab 05/17/16 0358 05/18/16 0249 05/19/16 0155 05/20/16 0608  WBC 10.5 10.3 10.1 8.9  NEUTROABS 7.5 8.2* 8.3* 6.9  HGB 10.9* 10.2* 10.2* 10.7*  HCT 31.2* 28.8* 28.5* 30.7*  MCV 107.2* 105.1* 106.3* 106.2*  PLT 149* 171 179 207    @IMGRELPRIORS @ Medications:    . aspirin  81 mg Oral Daily  . atorvastatin  80 mg Oral Daily  . calcium acetate  1,334 mg Oral TID WC  . [START ON 05/23/2016] darbepoetin (ARANESP) injection - DIALYSIS  200 mcg Intravenous Q Thu-HD  . febuxostat  20 mg Oral Daily  . midodrine  2.5 mg Oral TID WC  . multivitamin  1 tablet Oral QHS  . potassium chloride  20 mEq Oral Once  . sodium chloride flush  3 mL Intravenous Q12H  . ticagrelor   90 mg Oral BID   Dialysis:  [facility]  Assessment/ Plan:   1. SIRS- unclear etiology.  Slowly improving.  2. ESRD continue with HD qTTS for now  3. Anemia: on aranesp stable 4. CKD-MBD: cont with binders/vit D 5. Nutrition: renal  6. Hypertension: stable 7. NSTEMI +troponins. Has known CAD- inoperable 8. Disposition- was going to SNF, DNR  Jacob CordsJoseph A. Dhruva Orndoff, MD [facility], [facility] Pager [phone] 05/20/2016, 11:58 AM

## 2016-05-20 NOTE — Progress Notes (Signed)
This RN is taking over nursing care of the patient. Will continue to monitor.  

## 2016-05-20 NOTE — Progress Notes (Signed)
Physical Therapy Treatment Patient Details Name: Jacob MangoRobert M Zellman MRN: [id number] DOB: Nov 05, 1940 Today's Date: 05/20/2016    History of Present Illness Pt adm with sepsis of unknown source. Pt also with new afib. PMH - ESRD on HD, HTN, CAD, chf, gout, CVA, ileostomy    PT Comments    Pt is progressing, balance improved--more steady with gait but continues to require extra time to process functional commands; concerns for safety (pt initially sat back onto bed without warning, later stating he was "dizzy"); VSS sats 90s on RA and HR in 90s; continue to recommend STSNF  Follow Up Recommendations  SNF     Equipment Recommendations  None recommended by PT    Recommendations for Other Services       Precautions / Restrictions Precautions Precautions: Fall Precaution Comments: ileostomy Restrictions Weight Bearing Restrictions: No    Mobility  Bed Mobility Overal bed mobility: Needs Assistance Bed Mobility: Supine to Sit;Sit to Supine     Supine to sit: Supervision Sit to supine: Supervision   General bed mobility comments: incr time, no physical assist  Transfers Overall transfer level: Needs assistance Equipment used: Rolling walker (2 wheeled);None Transfers: Sit to/from Stand Sit to Stand: Supervision         General transfer comment: for safety; repeated x5 for strengthening/balance without use of UEs and with cues to control descent  Ambulation/Gait Ambulation/Gait assistance: Min guard;Min assist Ambulation Distance (Feet): 140 Feet Assistive device: Rolling walker (2 wheeled) Gait Pattern/deviations: Step-through pattern;Drifts right/left     General Gait Details: mildly unsteady gait, no overt LOB but requiring min to min/guard for safety and RW direction; amb 10' with min/guard and no AD   Stairs            Wheelchair Mobility    Modified Rankin (Stroke Patients Only)       Balance     Sitting balance-Leahy Scale: Good     Standing  balance support: No upper extremity supported;During functional activity Standing balance-Leahy Scale: Good Standing balance comment: pt is able to reach outside BOS and pick up object from floor min/guard for safety, no LOB                    Cognition Arousal/Alertness: Awake/alert Behavior During Therapy: WFL for tasks assessed/performed                 Problem Solving: Slow processing      Exercises      General Comments        Pertinent Vitals/Pain Pain Assessment: No/denies pain    Home Living                      Prior Function            PT Goals (current goals can now be found in the care plan section) Acute Rehab PT Goals Patient Stated Goal: not stated PT Goal Formulation: With patient/family Time For Goal Achievement: 05/31/16 Potential to Achieve Goals: Good Progress towards PT goals: Progressing toward goals    Frequency    Min 3X/week      PT Plan Current plan remains appropriate    Co-evaluation             End of Session Equipment Utilized During Treatment: Gait belt Activity Tolerance: Patient tolerated treatment well Patient left: in bed;with call bell/phone within reach (bed alarm not on when PT arrived)     Time: [id number] PT Time Calculation (min) (  ACUTE ONLY): 19 min  Charges:  $Gait Training: 8-22 mins                    G Codes:      Vedh Ptacek 05/20/2016, 10:07 AM

## 2016-05-21 LAB — RENAL FUNCTION PANEL
ALBUMIN: 3.5 g/dL (ref 3.5–5.0)
Anion gap: 17 — ABNORMAL HIGH (ref 5–15)
BUN: 57 mg/dL — AB (ref 6–20)
CO2: 21 mmol/L — ABNORMAL LOW (ref 22–32)
CREATININE: 8.82 mg/dL — AB (ref 0.61–1.24)
Calcium: 9.1 mg/dL (ref 8.9–10.3)
Chloride: 96 mmol/L — ABNORMAL LOW (ref 101–111)
GFR calc Af Amer: 6 mL/min — ABNORMAL LOW (ref >60)
GFR, EST NON AFRICAN AMERICAN: 5 mL/min — AB (ref >60)
Glucose, Bld: 117 mg/dL — ABNORMAL HIGH (ref 65–99)
PHOSPHORUS: 7 mg/dL — AB (ref 2.5–4.6)
Potassium: 3.2 mmol/L — ABNORMAL LOW (ref 3.5–5.1)
SODIUM: 134 mmol/L — AB (ref 135–145)

## 2016-05-21 LAB — CBC WITH DIFFERENTIAL/PLATELET
BASOS ABS: 0 10*3/uL (ref 0.0–0.1)
BASOS PCT: 0 %
EOS PCT: 2 %
Eosinophils Absolute: 0.1 10*3/uL (ref 0.0–0.7)
HCT: 29.3 % — ABNORMAL LOW (ref 39.0–52.0)
Hemoglobin: 10.2 g/dL — ABNORMAL LOW (ref 13.0–17.0)
LYMPHS ABS: 1 10*3/uL (ref 0.7–4.0)
Lymphocytes Relative: 13 %
MCH: 36.7 pg — AB (ref 26.0–34.0)
MCHC: 34.8 g/dL (ref 30.0–36.0)
MCV: 105.4 fL — ABNORMAL HIGH (ref 78.0–100.0)
Monocytes Absolute: 0.5 10*3/uL (ref 0.1–1.0)
Monocytes Relative: 6 %
Neutro Abs: 6.5 10*3/uL (ref 1.7–7.7)
Neutrophils Relative %: 79 %
PLATELETS: 215 10*3/uL (ref 150–400)
RBC: 2.78 MIL/uL — AB (ref 4.22–5.81)
RDW: 13.7 % (ref 11.5–15.5)
WBC: 8.1 10*3/uL (ref 4.0–10.5)

## 2016-05-21 LAB — MAGNESIUM: MAGNESIUM: 2.3 mg/dL (ref 1.7–2.4)

## 2016-05-21 MED ORDER — HEPARIN SODIUM (PORCINE) 1000 UNIT/ML DIALYSIS: 20.0000 [IU]/kg | INTRAMUSCULAR | Status: DC | PRN

## 2016-05-21 NOTE — Progress Notes (Signed)
Dialysis treatment completed.  1500 mL ultrafiltrated and net fluid removal 500 mL.    Patient status unchanged. Lung sounds diminished to ausculation in all fields. No edema. Cardiac: NSR.  Disconnected lines and removed needles.  Pressure held for 10 minutes and band aid/gauze dressing applied.  Report given to bedside RN, Dorene GrebeNatalie.

## 2016-05-21 NOTE — Procedures (Signed)
Patient was seen on dialysis and the procedure was supervised.  BFR 400  Via AVF BP is  98/55.   Patient appears to be tolerating treatment well  Jacob Fields A 05/21/2016

## 2016-05-21 NOTE — Progress Notes (Signed)
Subjective: Interval History: has no complaint .  Objective: Vital signs in last 24 hours: Temp:  [98 F (36.7 C)-98.4 F (36.9 C)] 98 F (36.7 C) (12/05 0553) Pulse Rate:  [68-90] 80 (12/05 0553) Resp:  [18] 18 (12/05 0553) BP: (113-131)/(57-80) 113/72 (12/05 0553) SpO2:  [96 %-100 %] 100 % (12/05 0553) Weight:  [65.1 kg (143 lb 8.3 oz)] 65.1 kg (143 lb 8.3 oz) (12/04 2124) Weight change: 0.689 kg (1 lb 8.3 oz)  Intake/Output from previous day: 12/04 0701 - 12/05 0700 In: 540 [P.O.:540] Out: 720 [Urine:245; Stool:475] Intake/Output this shift: No intake/output data recorded.  General appearance: cooperative, no distress and confused Resp: rales bibasilar Cardio: S1, S2 normal and systolic murmur: systolic ejection 2/6, decrescendo at 2nd left intercostal space GI: soft, non-tender; bowel sounds normal; no masses,  no organomegaly ExtremitieAVF LUA B&TAVF LUA B&T  Lab Results:  Recent Labs  05/19/16 0155 05/20/16 0608  WBC 10.1 8.9  HGB 10.2* 10.7*  HCT 28.5* 30.7*  PLT 179 207   BMET:  Recent Labs  05/19/16 0155 05/20/16 0608  NA 133*  134* 133*  K 3.4*  3.3* 3.7  CL 93*  94* 95*  CO2 25  25 20*  GLUCOSE 167*  170* 132*  BUN 20  21* 40*  CREATININE 4.30*  4.35* 7.01*  CALCIUM 8.9  9.1 9.2   No results for input(s): PTH in the last 72 hours. Iron Studies:  Recent Labs  05/19/16 0155  IRON 82  TIBC 300  FERRITIN 1,167*    Studies/Results: No results found.  I have reviewed the patient's current medications.  Assessment/Plan: 1 ESRD for HD 2 Anemia esa, 3HPTH vit D 4 Confusion 5 CAD 6 Debill P NHP, Hd, esa,     LOS: 10 days   Gwin Eagon L 05/21/2016,7:02 AM

## 2016-05-21 NOTE — Progress Notes (Signed)
Patient arrived to unit per bed.  Reviewed treatment plan and this RN agrees.  Report received from bedside RN, Nehemiah SettleBrooke.  Consent verified.  Patient A & O X 4. Lung sounds clear to ausculation in all fields. No edema. Cardiac: NSR, BBB, afib.  Prepped LUAVG with alcohol and cannulated with two 15 gauge needles.  Pulsation of blood noted.  Flushed access well with saline per protocol.  Connected and secured lines and initiated tx at 1137.  UF goal of 2500 mL and net fluid removal of 2000 mL.  Will continue to monitor.

## 2016-05-21 NOTE — Progress Notes (Signed)
PROGRESS NOTE [facility]   Jacob MangoRobert M Mckain   YNW:[id number] DOB: 20-Dec-1940  DOA: 05/11/2016 PCP: No primary care provider on file.   Brief Narrative:  75 year old male with end-stage renal disease on dialysis, ulcerative colitis, status post colectomy with end ileostomy, CVA, hypertension. [place]. Presented to the ED with altered mental status, fever and chills after completing dialysis. Patient was admitted to the ICU for septic shock started on vasopressors. Course was complicated with a NSTEMI 2/2 demand ischemia. Development A. fib. Patient was treated medically given poor clinical condition. Transferred to the floor on 11/29. I waiting for nursing home placement and dialysis outpatient setting given [place] to the [place] area.  Subjective: Patient seen during dialysis, no complaints today. Tolerating dialysis well. Awaiting to get HD unit in the [place] area, and SNF bed.    Assessment & Plan:  Septic shock, unclear source:- Sepsis physiology has resolved Urine?   Completed course of antibiotics  Blood and urine cultures NGTD  ESRD on HD T/Th/S: Management per nephrology  New atrial fibrillation(CHADS2Vasc 4) -rate control Per cardiology Dr. Yates DecampJay Ganji  needs to be evaluated for "Watchman device" for non valvular atrial fibrillation with contraindication to long term anticoagulation. I have advised him to discuss this further with his cardiologist in [place], [place].  NSTEMI/Chronic Systolic and Diastolic CHF  Patient will need a coronary angiogram, will be done as an outpatient per Dr. Jacinto HalimGanji  Patient on mido, aspirin and brillinta  Gout: Uloric  History of stroke: ASA  Ulcerative colitis: WOC consult appreciated  DVT prophylaxis: SCD Code Status: DO NOT RESUSCITATE Family Communication: None Disposition Plan: Medically stable D/c to SNF when bed available   Consultants:   Cardiology - Dr Jacinto HalimGanji     Nephrology - [facility]   Procedures:  11/26 Echocardiogram: LVEF = 40%. Hypokinesis of the basal-midinferolateral and inferior myocardium.  -(grade 2 diastolicdysfunction). - Aortic valve: mild to moderate stenosis.  Antimicrobials: Zosyn 11/26>> Vancomycin 11/25>> 11/28   Objective: Vitals:   05/21/16 1440 05/21/16 1500 05/21/16 1537 05/21/16 1540  BP: (!) 92/46 133/60 (!) 95/44 112/61  Pulse: 63 85 80 81  Resp:   12   Temp:   98.1 F (36.7 C)   TempSrc:      SpO2:      Weight:   64 kg (141 lb 1.5 oz)   Height:        Intake/Output Summary (Last 24 hours) at 05/21/16 1723 Last data filed at 05/21/16 1540  Gross per 24 hour  Intake              180 ml  Output              845 ml  Net             -665 ml   Filed Weights   05/20/16 2124 05/21/16 1126 05/21/16 1537  Weight: 65.1 kg (143 lb 8.3 oz) 64.5 kg (142 lb 3.2 oz) 64 kg (141 lb 1.5 oz)    Examination:  General exam: Lying in bed comfortable  Respiratory system: Clear, no crackles or rales  Cardiovascular system: S1S2 no murmurs  Gastrointestinal system: Soft NT ND +bs  Extremities: No pedal edema.      Data Reviewed: I have personally reviewed following labs and imaging studies  CBC:  Recent Labs Lab 05/17/16 0358 05/18/16 0249 05/19/16 0155 05/20/16 0608 05/21/16 0641  WBC 10.5 10.3 10.1 8.9 8.1  NEUTROABS 7.5 8.2*  8.3* 6.9 6.5  HGB 10.9* 10.2* 10.2* 10.7* 10.2*  HCT 31.2* 28.8* 28.5* 30.7* 29.3*  MCV 107.2* 105.1* 106.3* 106.2* 105.4*  PLT 149* 171 179 207 215   Basic Metabolic Panel:  Recent Labs Lab 05/17/16 0358 05/18/16 0249 05/19/16 0155 05/20/16 0608 05/21/16 0641  NA 136 135 133*  134* 133* 134*  K 4.1 4.1 3.4*  3.3* 3.7 3.2*  CL 96* 98* 93*  94* 95* 96*  CO2 23 22 25  25  20* 21*  GLUCOSE 137* 123* 167*  170* 132* 117*  BUN 24* 45* 20  21* 40* 57*  CREATININE 4.31* 6.45* 4.30*  4.35* 7.01* 8.82*  CALCIUM 9.2 9.7 8.9  9.1 9.2 9.1  MG 2.1 2.2 2.0 2.2 2.3   PHOS 3.9 4.8* 4.4 6.1* 7.0*   GFR: Estimated Creatinine Clearance: 6.4 mL/min (by C-G formula based on SCr of 8.82 mg/dL (H)). Liver Function Tests:  Recent Labs Lab 05/17/16 0358 05/18/16 0249 05/19/16 0155 05/20/16 0608 05/21/16 0641  ALBUMIN 3.2* 3.0* 3.2* 3.6 3.5   No results for input(s): LIPASE, AMYLASE in the last 168 hours. No results for input(s): AMMONIA in the last 168 hours. Coagulation Profile: No results for input(s): INR, PROTIME in the last 168 hours. Cardiac Enzymes:  Recent Labs Lab 05/18/16 0249 05/18/16 0735 05/18/16 1512  TROPONINI 0.50* 0.43* 0.40*   BNP (last 3 results) No results for input(s): PROBNP in the last 8760 hours. HbA1C: No results for input(s): HGBA1C in the last 72 hours. CBG:  Recent Labs Lab 05/17/16 2213 05/18/16 1217 05/18/16 1634 05/18/16 2118 05/19/16 0800  GLUCAP 149* 99 120* 96 104*   Lipid Profile: No results for input(s): CHOL, HDL, LDLCALC, TRIG, CHOLHDL, LDLDIRECT in the last 72 hours. Thyroid Function Tests: No results for input(s): TSH, T4TOTAL, FREET4, T3FREE, THYROIDAB in the last 72 hours. Anemia Panel:  Recent Labs  05/19/16 0155  VITAMINB12 526  FOLATE 38.6  FERRITIN 1,167*  TIBC 300  IRON 82  RETICCTPCT 3.0   Sepsis Labs: No results for input(s): PROCALCITON, LATICACIDVEN in the last 168 hours.  Recent Results (from the past 240 hour(s))  Culture, blood (Routine x 2)     Status: None   Collection Time: 05/11/16  8:58 PM  Result Value Ref Range Status   Specimen Description BLOOD RIGHT HAND  Final   Special Requests BOTTLES DRAWN AEROBIC AND ANAEROBIC 5CC  Final   Culture   Final    NO GROWTH 5 DAYS Performed at [facility]    Report Status 05/17/2016 FINAL  Final  Culture, blood (Routine x 2)     Status: None   Collection Time: 05/11/16  8:58 PM  Result Value Ref Range Status   Specimen Description BLOOD RIGHT FOREARM  Final   Special Requests IN PEDIATRIC BOTTLE 4 CC  Final    Culture   Final    NO GROWTH 5 DAYS Performed at [facility]    Report Status 05/17/2016 FINAL  Final  MRSA PCR Screening     Status: None   Collection Time: 05/12/16 11:05 AM  Result Value Ref Range Status   MRSA by PCR NEGATIVE NEGATIVE Final    Comment:        The GeneXpert MRSA Assay (FDA approved for NASAL specimens only), is one component of a comprehensive MRSA colonization surveillance program. It is not intended to diagnose MRSA infection nor to guide or monitor treatment for MRSA infections.   Culture, Urine  Status: Abnormal   Collection Time: 05/12/16  2:55 PM  Result Value Ref Range Status   Specimen Description URINE, CLEAN CATCH  Final   Special Requests Normal  Final   Culture MULTIPLE SPECIES PRESENT, SUGGEST RECOLLECTION (A)  Final   Report Status 05/14/2016 FINAL  Final  Culture, Urine     Status: None   Collection Time: 05/17/16 12:28 PM  Result Value Ref Range Status   Specimen Description URINE, CLEAN CATCH  Final   Special Requests NONE  Final   Culture NO GROWTH  Final   Report Status 05/18/2016 FINAL  Final     Radiology Studies: No results found.    Scheduled Meds: . aspirin  81 mg Oral Daily  . atorvastatin  80 mg Oral Daily  . calcium acetate  1,334 mg Oral TID WC  . [START ON 05/23/2016] darbepoetin (ARANESP) injection - DIALYSIS  200 mcg Intravenous Q Thu-HD  . febuxostat  20 mg Oral Daily  . midodrine  2.5 mg Oral TID WC  . multivitamin  1 tablet Oral QHS  . potassium chloride  20 mEq Oral Once  . sodium chloride flush  3 mL Intravenous Q12H  . ticagrelor  90 mg Oral BID   Continuous Infusions:   LOS: 10 days    Latrelle DodrillEdwin Silva, MD [facility] Pager [phone]  If 7PM-7AM, please contact night-coverage [email or web address] Password [email or web address] 05/21/2016, 5:23 PM

## 2016-05-21 NOTE — Progress Notes (Signed)
OT Cancellation    05/21/16 1155  OT Visit Information  Last OT Received On 05/21/16  Reason Eval/Treat Not Completed Patient at procedure or test/ unavailable (at HD)  [facility] [place], OTR/L  [phone] 05/21/2016

## 2016-05-22 DIAGNOSIS — I480 Paroxysmal atrial fibrillation: Secondary | ICD-10-CM

## 2016-05-22 DIAGNOSIS — I214 Non-ST elevation (NSTEMI) myocardial infarction: Secondary | ICD-10-CM

## 2016-05-22 DIAGNOSIS — K5100 Ulcerative (chronic) pancolitis without complications: Secondary | ICD-10-CM

## 2016-05-22 LAB — CBC WITH DIFFERENTIAL/PLATELET
Basophils Absolute: 0 10*3/uL (ref 0.0–0.1)
Basophils Relative: 0 %
EOS ABS: 0.1 10*3/uL (ref 0.0–0.7)
Eosinophils Relative: 1 %
HEMATOCRIT: 30.3 % — AB (ref 39.0–52.0)
HEMOGLOBIN: 10.4 g/dL — AB (ref 13.0–17.0)
LYMPHS ABS: 1.5 10*3/uL (ref 0.7–4.0)
LYMPHS PCT: 14 %
MCH: 37.1 pg — AB (ref 26.0–34.0)
MCHC: 34.3 g/dL (ref 30.0–36.0)
MCV: 108.2 fL — AB (ref 78.0–100.0)
MONOS PCT: 6 %
Monocytes Absolute: 0.7 10*3/uL (ref 0.1–1.0)
Neutro Abs: 8.5 10*3/uL — ABNORMAL HIGH (ref 1.7–7.7)
Neutrophils Relative %: 79 %
Platelets: 203 10*3/uL (ref 150–400)
RBC: 2.8 MIL/uL — ABNORMAL LOW (ref 4.22–5.81)
RDW: 14.2 % (ref 11.5–15.5)
WBC: 10.8 10*3/uL — ABNORMAL HIGH (ref 4.0–10.5)

## 2016-05-22 LAB — RENAL FUNCTION PANEL
Albumin: 3.5 g/dL (ref 3.5–5.0)
Anion gap: 12 (ref 5–15)
BUN: 29 mg/dL — AB (ref 6–20)
CHLORIDE: 93 mmol/L — AB (ref 101–111)
CO2: 29 mmol/L (ref 22–32)
CREATININE: 5.63 mg/dL — AB (ref 0.61–1.24)
Calcium: 9.2 mg/dL (ref 8.9–10.3)
GFR calc non Af Amer: 9 mL/min — ABNORMAL LOW (ref >60)
GFR, EST AFRICAN AMERICAN: 10 mL/min — AB (ref >60)
Glucose, Bld: 129 mg/dL — ABNORMAL HIGH (ref 65–99)
POTASSIUM: 4.2 mmol/L (ref 3.5–5.1)
Phosphorus: 4.4 mg/dL (ref 2.5–4.6)
Sodium: 134 mmol/L — ABNORMAL LOW (ref 135–145)

## 2016-05-22 MED ORDER — SODIUM CHLORIDE 0.9 % IV SOLN
INTRAVENOUS | Status: AC
  Administered 2016-05-22: 12:00:00 via INTRAVENOUS

## 2016-05-22 NOTE — Progress Notes (Signed)
Occupational Therapy Evaluation Patient Details Name: Jacob Fields MRN: [id number] DOB: September 25, 1940 Today's Date: 05/22/2016    History of Present Illness Pt adm with sepsis of unknown source. Pt also with new afib. PMH - ESRD on HD, HTN, CAD, chf, gout, CVA, ileostomy   Clinical Impression   PTA, pt independent with ADL and mobility. Pt making excellent progress. Mobilizing and completing ADL with overall S level. If family can provide 24/7 S, pt appropriate for DC home. Pt will benefit from rehab at home or SNF if 124/7 S not available. Will follow acutely.     Follow Up Recommendations  Supervision/Assistance - 24 hour    Equipment Recommendations  3 in 1 bedside comode    Recommendations for Other Services       Precautions / Restrictions Precautions Precautions: Fall      Mobility Bed Mobility Overal bed mobility: Modified Independent                Transfers Overall transfer level: Needs assistance Equipment used: Rolling walker (2 wheeled);None Transfers: Sit to/from [employer] CorporationStand;Stand Pivot Transfers Sit to Stand: Supervision Stand pivot transfers: Supervision            Balance Overall balance assessment: Needs assistance   Sitting balance-Leahy Scale: Good       Standing balance-Leahy Scale: Good                              ADL Overall ADL's : Needs assistance/impaired                                     Functional mobility during ADLs: Supervision/safety;Rolling walker General ADL Comments: Able to compelte bathing/dressing with set up @ S level. Able to retireve items from floor with S     Vision     Perception     Praxis      Pertinent Vitals/Pain Pain Assessment: No/denies pain     Hand Dominance Right   Extremity/Trunk Assessment Upper Extremity Assessment Upper Extremity Assessment: Generalized weakness   Lower Extremity Assessment Lower Extremity Assessment: Generalized weakness   Cervical  / Trunk Assessment Cervical / Trunk Assessment: Normal   Communication Communication Communication: HOH   Cognition Arousal/Alertness: Awake/alert Behavior During Therapy: WFL for tasks assessed/performed Overall Cognitive Status: No family/caregiver present to determine baseline cognitive functioning    Cognitive status appears to have improved.  Will assess with MOCA                 General Comments    Pt appears aware of situation   Exercises       Shoulder Instructions      Home Living Family/patient expects to be discharged to:: Skilled nursing facility                                        Prior Functioning/Environment Level of Independence: Independent                 OT Problem List: Decreased strength;Decreased activity tolerance;Impaired balance (sitting and/or standing);Decreased safety awareness;Decreased knowledge of use of DME or AE;Cardiopulmonary status limiting activity   OT Treatment/Interventions: Self-care/ADL training;Therapeutic exercise;DME and/or AE instruction;Therapeutic activities;Patient/family education;Cognitive remediation/compensation    OT Goals(Current goals can be found in the care plan  section) Acute Rehab OT Goals Patient Stated Goal: to go home OT Goal Formulation: With patient Time For Goal Achievement: 06/05/16 Potential to Achieve Goals: Good  OT Frequency: Min 2X/week   Barriers to D/C:            Co-evaluation              End of Session Equipment Utilized During Treatment: Gait belt;Rolling walker Nurse Communication: Mobility status  Activity Tolerance: Patient tolerated treatment well Patient left: in chair;with call bell/phone within reach;with chair alarm set   Time: [id number] OT Time Calculation (min): 17 min Charges:  OT General Charges $OT Visit: 1 Procedure OT Evaluation $OT Eval Moderate Complexity: 1 Procedure G-Codes:    Jacob Fields,Jacob Fields 05/22/2016, 3:08 PM   [facility]  Dent Plantz, OTR/L  [phone] 05/22/2016

## 2016-05-22 NOTE — Progress Notes (Signed)
05/22/2016 1:34 PM Hemodialysis Outpatient Note; this patient has been [facility] on a Monday,Wednesday and Friday 2nd shift schedule. Per the clinic manager the chair time will be determined nearer patient discharge. Thank you. Cleotis NipperJudith Vernis [place]

## 2016-05-22 NOTE — Progress Notes (Signed)
Physical Therapy Treatment Patient Details Name: Jacob Fields MRN: [id number] DOB: 05-02-1941 Today's Date: 05/22/2016    History of Present Illness Pt adm with sepsis of unknown source. Pt also with new afib. PMH - ESRD on HD, HTN, CAD, chf, gout, CVA, ileostomy    PT Comments    Progressing steadily.  Still mildly unsteady.  DGI of 19 predictive of potential for falls   Follow Up Recommendations  Home health PT     Equipment Recommendations  None recommended by PT    Recommendations for Other Services       Precautions / Restrictions Precautions Precautions: Fall Precaution Comments: ileostomy Restrictions Weight Bearing Restrictions: No    Mobility  Bed Mobility Overal bed mobility: Modified Independent                Transfers Overall transfer level: Needs assistance Equipment used: None Transfers: Sit to/from Stand Sit to Stand: Supervision Stand pivot transfers: Supervision          Ambulation/Gait Ambulation/Gait assistance: Min guard Ambulation Distance (Feet): 400 Feet Assistive device: None Gait Pattern/deviations: Step-through pattern Gait velocity: prefers slower, but able to speed up appreciably Gait velocity interpretation: at or above normal speed for age/gender General Gait Details: mildly unsteady overall, no overt LOB, see DGI   Stairs Stairs: Yes Stairs assistance: Supervision Stair Management: One rail Right;One rail Left;Alternating pattern;Forwards Number of Stairs: 5 General stair comments: safe with rail  Wheelchair Mobility    Modified Rankin (Stroke Patients Only)       Balance Overall balance assessment: Needs assistance   Sitting balance-Leahy Scale: Good       Standing balance-Leahy Scale: Fair                      Cognition Arousal/Alertness: Awake/alert Behavior During Therapy: WFL for tasks assessed/performed Overall Cognitive Status: No family/caregiver present to determine baseline  cognitive functioning       Memory: Decreased short-term memory   Safety/Judgement: Decreased awareness of deficits   Problem Solving: Slow processing      Exercises      General Comments General comments (skin integrity, edema, etc.): At 19/24, pt has a potential for falls.      Pertinent Vitals/Pain Pain Assessment: No/denies pain    Home Living Family/patient expects to be discharged to:: Skilled nursing facility                    Prior Function Level of Independence: Independent          PT Goals (current goals can now be found in the care plan section) Acute Rehab PT Goals Patient Stated Goal: to go home PT Goal Formulation: With patient/family Time For Goal Achievement: 05/31/16 Potential to Achieve Goals: Good Progress towards PT goals: Progressing toward goals    Frequency    Min 3X/week      PT Plan Discharge plan needs to be updated    Co-evaluation             End of Session   Activity Tolerance: Patient tolerated treatment well Patient left: with call bell/phone within reach;in chair;with chair alarm set     Time: 4098-11911616-1628 PT Time Calculation (min) (ACUTE ONLY): 12 min  Charges:  $Gait Training: 8-22 mins                    G CodesEliseo Gum:      Sally-Anne Wamble V Travonne Schowalter 05/22/2016, 4:35 PM 05/22/2016  [place] BingKen Isaura Schiller,  PT [phone]  (pager)

## 2016-05-22 NOTE — Progress Notes (Signed)
PROGRESS NOTE    Adline MangoRobert M Kellman  RUE:[id number] DOB: 06/20/1940 DOA: 05/11/2016 PCP: No primary care provider on file.   Brief Narrative:  75 yo male with ESRD on HD presented with altered mental status and fever, admitted to the ICU due to hypotension requiring vasopressors. Diagnosed with shock due to urine infection. Hospitalization complicated by atrial fibrillation. Has remain stable on the medical unit, waiting for placement.    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 Urine infection complicated by septic shock. Urine culture with multiple species, blood cultures no growth. Patient as completed antibiotic therapy. Blood pressure 90 to 100 systolic. Will continue midodrine.   2. Metabolic encephalopathy. Patient is more awake and alert, able to follow commands, non focal, will continue to follow physical therapy recommendations. Continue trazodone.   3, Atrial fibrillation. Will continue rate control, not candidate for anticoagulation per cardiology recommendations, ekg personally reviewed noted 1st degree av block with with a right bundle branch block.  4. Heart failure with NSTEMI. Echocardiography with systolic LV function at 40% with hypokinesis at the basal inferior and lateral. With mild to moderate aortic stenosis. Will continue aspirin/ brillinta and statin therapy.   5. Ulcerative colitis. Stable, patient with no diarrhea or abdominal pain, tolerating po well.   6. ESRD on HD. Will continue HD per nephrology recommendations, continue phoslo. Aranesp.       DVT prophylaxis: scd  Code Status: DNR  Family Communication: No family at the bedside  Disposition Plan: snf with HD.   Consultants:   Nephrology   Procedures:   Antimicrobials:    Subjective: Patient feeling well, no nausea or vomiting, tolerating po well.   Objective: Vitals:   05/21/16 2142 05/22/16 0114 05/22/16 0600 05/22/16 0955  BP: (!) 83/44 (!) 99/59 108/62 110/66  Pulse: 88 77 75 87  Resp: 16  14 18   Temp: 98.2 F (36.8 C)  98.1 F (36.7 C) 98 F (36.7 C)  TempSrc: Oral  Oral Oral  SpO2: 94%  98% 100%  Weight: 63.8 kg (140 lb 10.5 oz)     Height:        Intake/Output Summary (Last 24 hours) at 05/22/16 1019 Last data filed at 05/22/16 0900  Gross per 24 hour  Intake              530 ml  Output              875 ml  Net             -345 ml   Filed Weights   05/21/16 1126 05/21/16 1537 05/21/16 2142  Weight: 64.5 kg (142 lb 3.2 oz) 64 kg (141 lb 1.5 oz) 63.8 kg (140 lb 10.5 oz)    Examination:  General exam: deconditioned, not in pain or dyspnea E ENT: no pallor or icterus.   Respiratory system: Clear to auscultation. Respiratory effort normal. Cardiovascular system: S1 & S2 heard, RRR. No JVD, murmurs, rubs, gallops or clicks. No pedal edema. Gastrointestinal system: Abdomen is nondistended, soft and nontender. No organomegaly or masses felt.  Normal bowel sounds heard. Central nervous system: Alert and oriented. No focal neurological deficits. Extremities: Symmetric 5 x 5 power. Skin: No rashes, lesions or ulcers e.     Data Reviewed: I have personally reviewed following labs and imaging studies  CBC:  Recent Labs Lab 05/18/16 0249 05/19/16 0155 05/20/16 0608 05/21/16 0641 05/22/16 0540  WBC 10.3 10.1 8.9 8.1 10.8*  NEUTROABS 8.2* 8.3* 6.9 6.5 8.5*  HGB 10.2* 10.2* 10.7* 10.2* 10.4*  HCT 28.8* 28.5* 30.7* 29.3* 30.3*  MCV 105.1* 106.3* 106.2* 105.4* 108.2*  PLT 171 179 207 215 203   Basic Metabolic Panel:  Recent  Labs Lab 05/17/16 0358 05/18/16 0249 05/19/16 0155 05/20/16 0608 05/21/16 0641 05/22/16 0540  NA 136 135 133*  134* 133* 134* 134*  K 4.1 4.1 3.4*  3.3* 3.7 3.2* 4.2  CL 96* 98* 93*  94* 95* 96* 93*  CO2 23 22 25  25  20* 21* 29  GLUCOSE 137* 123* 167*  170* 132* 117* 129*  BUN 24* 45* 20  21* 40* 57* 29*  CREATININE 4.31* 6.45* 4.30*  4.35* 7.01* 8.82* 5.63*  CALCIUM 9.2 9.7 8.9  9.1 9.2 9.1 9.2  MG 2.1 2.2 2.0 2.2 2.3  --   PHOS 3.9 4.8* 4.4 6.1* 7.0* 4.4   GFR: Estimated Creatinine Clearance: 10.1 mL/min (by C-G formula based on SCr of 5.63 mg/dL (H)). Liver Function Tests:  Recent Labs Lab 05/18/16 0249 05/19/16 0155 05/20/16 0608 05/21/16 0641 05/22/16 0540  ALBUMIN 3.0* 3.2* 3.6 3.5 3.5   No results for input(s): LIPASE, AMYLASE in the last 168 hours. No results for input(s): AMMONIA in the last 168 hours. Coagulation Profile: No results for input(s): INR, PROTIME in the last 168 hours. Cardiac Enzymes:  Recent Labs Lab 05/18/16 0249 05/18/16 0735 05/18/16 1512  TROPONINI 0.50* 0.43* 0.40*   BNP (last 3 results) No results for input(s): PROBNP in the last 8760 hours. HbA1C: No results for input(s): HGBA1C in the last 72 hours. CBG:  Recent Labs Lab 05/17/16 2213 05/18/16 1217 05/18/16 1634 05/18/16 2118 05/19/16 0800  GLUCAP 149* 99 120* 96 104*   Lipid Profile: No results for input(s): CHOL, HDL, LDLCALC, TRIG, CHOLHDL, LDLDIRECT in the last 72 hours. Thyroid Function Tests: No results for input(s): TSH, T4TOTAL, FREET4, T3FREE, THYROIDAB in the last 72 hours. Anemia Panel: No results for input(s): VITAMINB12, FOLATE, FERRITIN, TIBC, IRON, RETICCTPCT in the last 72 hours. Sepsis Labs: No results for input(s): PROCALCITON, LATICACIDVEN in the last 168 hours.  Recent Results (from the past 240 hour(s))  MRSA PCR Screening     Status: None   Collection Time: 05/12/16 11:05 AM  Result Value Ref Range Status   MRSA by PCR NEGATIVE  NEGATIVE Final    Comment:        The GeneXpert MRSA Assay (FDA approved for NASAL specimens only), is one component of a comprehensive MRSA colonization surveillance program. It is not intended to diagnose MRSA infection nor to guide or monitor treatment for MRSA infections.   Culture, Urine     Status: Abnormal   Collection Time: 05/12/16  2:55 PM  Result Value Ref Range Status   Specimen Description URINE, CLEAN CATCH  Final   Special Requests Normal  Final   Culture MULTIPLE SPECIES PRESENT, SUGGEST RECOLLECTION (A)  Final   Report Status 05/14/2016 FINAL  Final  Culture, Urine     Status: None   Collection Time: 05/17/16 12:28 PM  Result Value Ref Range Status  Specimen Description URINE, CLEAN CATCH  Final   Special Requests NONE  Final   Culture NO GROWTH  Final   Report Status 05/18/2016 FINAL  Final         Radiology Studies: No results found.      Scheduled Meds: . aspirin  81 mg Oral Daily  . atorvastatin  80 mg Oral Daily  . calcium acetate  1,334 mg Oral TID WC  . [START ON 05/23/2016] darbepoetin (ARANESP) injection - DIALYSIS  200 mcg Intravenous Q Thu-HD  . febuxostat  20 mg Oral Daily  . midodrine  2.5 mg Oral TID WC  . multivitamin  1 tablet Oral QHS  . potassium chloride  20 mEq Oral Once  . sodium chloride flush  3 mL Intravenous Q12H  . ticagrelor  90 mg Oral BID   Continuous Infusions:   LOS: 11 days       Jeania Nater Annett Gulaaniel Jacqueli Pangallo, MD [facility] Pager [phone]  If 7PM-7AM, please contact night-coverage [email or web address] Password [email or web address] 05/22/2016, 10:19 AM

## 2016-05-22 NOTE — Progress Notes (Signed)
CCMD called reporting patient having frequent PVC's. Patient found resting comfortably in room, alert and oriented. V/s stable (see flow sheet)

## 2016-05-22 NOTE — Progress Notes (Signed)
Subjective:  HD yest- removed 500- post weight 64- BP is soft- says cramped with only minimal removal and is still cramping Objective Vital signs in last 24 hours: Vitals:   05/21/16 2142 05/22/16 0114 05/22/16 0600 05/22/16 0955  BP: (!) 83/44 (!) 99/59 108/62 110/66  Pulse: 88 77 75 87  Resp: 16  14 18   Temp: 98.2 F (36.8 C)  98.1 F (36.7 C) 98 F (36.7 C)  TempSrc: Oral  Oral Oral  SpO2: 94%  98% 100%  Weight: 63.8 kg (140 lb 10.5 oz)     Height:       Weight change: -0.6 kg (-1 lb 5.2 oz)  Intake/Output Summary (Last 24 hours) at 05/22/16 1140 Last data filed at 05/22/16 0900  Gross per 24 hour  Intake              530 ml  Output              875 ml  Net             -345 ml    Assessment/ Plan: Pt is a 75 y.o. yo male ESRD who was admitted on 05/11/2016 with sepsis  Assessment/Plan: 1. Sepsis- unclear source but clinically improved 2. ESRD - normally [facility] via AVF- have been doing on schedule during this hospitalization 3. Anemia- hgb 10.4, on ESA 4. Secondary hyperparathyroidism- on phoslo - calc/phos good- last PTH 52 no vit D 5. HTN/volume- on midodrine- BP is soft, does not appear overloaded at all- I am going to give him 250 of saline and will not need much UF at all with HD tomorrow 6. NSTEMI/Afib- complicating current hosp- possible candidate for Watchman device- to be further evald as OP 7. Dispo- awaiting SNF placement- will need to make sure HD is part of those arrangements  Asiel Chrostowski A    Labs: Basic Metabolic Panel:  Recent Labs Lab 05/20/16 0608 05/21/16 0641 05/22/16 0540  NA 133* 134* 134*  K 3.7 3.2* 4.2  CL 95* 96* 93*  CO2 20* 21* 29  GLUCOSE 132* 117* 129*  BUN 40* 57* 29*  CREATININE 7.01* 8.82* 5.63*  CALCIUM 9.2 9.1 9.2  PHOS 6.1* 7.0* 4.4   Liver Function Tests:  Recent Labs Lab 05/20/16 0608 05/21/16 0641 05/22/16 0540  ALBUMIN 3.6 3.5 3.5   No results for input(s): LIPASE, AMYLASE in the last 168  hours. No results for input(s): AMMONIA in the last 168 hours. CBC:  Recent Labs Lab 05/18/16 0249 05/19/16 0155 05/20/16 0608 05/21/16 0641 05/22/16 0540  WBC 10.3 10.1 8.9 8.1 10.8*  NEUTROABS 8.2* 8.3* 6.9 6.5 8.5*  HGB 10.2* 10.2* 10.7* 10.2* 10.4*  HCT 28.8* 28.5* 30.7* 29.3* 30.3*  MCV 105.1* 106.3* 106.2* 105.4* 108.2*  PLT 171 179 207 215 203   Cardiac Enzymes:  Recent Labs Lab 05/18/16 0249 05/18/16 0735 05/18/16 1512  TROPONINI 0.50* 0.43* 0.40*   CBG:  Recent Labs Lab 05/17/16 2213 05/18/16 1217 05/18/16 1634 05/18/16 2118 05/19/16 0800  GLUCAP 149* 99 120* 96 104*    Iron Studies: No results for input(s): IRON, TIBC, TRANSFERRIN, FERRITIN in the last 72 hours. Studies/Results: No results found. Medications: Infusions:   Scheduled Medications: . aspirin  81 mg Oral Daily  . atorvastatin  80 mg Oral Daily  . calcium acetate  1,334 mg Oral TID WC  . [START ON 05/23/2016] darbepoetin (ARANESP) injection - DIALYSIS  200 mcg Intravenous Q Thu-HD  . febuxostat  20 mg Oral  Daily  . midodrine  2.5 mg Oral TID WC  . multivitamin  1 tablet Oral QHS  . potassium chloride  20 mEq Oral Once  . sodium chloride flush  3 mL Intravenous Q12H  . ticagrelor  90 mg Oral BID    have reviewed scheduled and prn medications.  Physical Exam: General: NAD Heart: RRR Lungs: clear Abdomen: soft, non tender Extremities: no edema Dialysis Access: left AVF slight bruising- patent    05/22/2016,11:40 AM  LOS: 11 days

## 2016-05-23 LAB — RENAL FUNCTION PANEL
Albumin: 3.7 g/dL (ref 3.5–5.0)
Anion gap: 17 — ABNORMAL HIGH (ref 5–15)
BUN: 50 mg/dL — AB (ref 6–20)
CHLORIDE: 96 mmol/L — AB (ref 101–111)
CO2: 20 mmol/L — AB (ref 22–32)
CREATININE: 7.56 mg/dL — AB (ref 0.61–1.24)
Calcium: 9.4 mg/dL (ref 8.9–10.3)
GFR calc Af Amer: 7 mL/min — ABNORMAL LOW (ref >60)
GFR, EST NON AFRICAN AMERICAN: 6 mL/min — AB (ref >60)
Glucose, Bld: 127 mg/dL — ABNORMAL HIGH (ref 65–99)
Phosphorus: 6 mg/dL — ABNORMAL HIGH (ref 2.5–4.6)
Potassium: 3.7 mmol/L (ref 3.5–5.1)
SODIUM: 133 mmol/L — AB (ref 135–145)

## 2016-05-23 LAB — MAGNESIUM: Magnesium: 2 mg/dL (ref 1.7–2.4)

## 2016-05-23 LAB — CBC WITH DIFFERENTIAL/PLATELET
Basophils Absolute: 0 10*3/uL (ref 0.0–0.1)
Basophils Relative: 0 %
EOS ABS: 0.1 10*3/uL (ref 0.0–0.7)
Eosinophils Relative: 1 %
HCT: 28.3 % — ABNORMAL LOW (ref 39.0–52.0)
Hemoglobin: 10 g/dL — ABNORMAL LOW (ref 13.0–17.0)
LYMPHS ABS: 1.1 10*3/uL (ref 0.7–4.0)
Lymphocytes Relative: 9 %
MCH: 37.9 pg — AB (ref 26.0–34.0)
MCHC: 35.3 g/dL (ref 30.0–36.0)
MCV: 107.2 fL — ABNORMAL HIGH (ref 78.0–100.0)
MONOS PCT: 6 %
Monocytes Absolute: 0.8 10*3/uL (ref 0.1–1.0)
Neutro Abs: 10 10*3/uL — ABNORMAL HIGH (ref 1.7–7.7)
Neutrophils Relative %: 84 %
PLATELETS: 225 10*3/uL (ref 150–400)
RBC: 2.64 MIL/uL — ABNORMAL LOW (ref 4.22–5.81)
RDW: 13.8 % (ref 11.5–15.5)
WBC: 12 10*3/uL — ABNORMAL HIGH (ref 4.0–10.5)

## 2016-05-23 MED ORDER — MIDODRINE HCL 5 MG PO TABS
ORAL_TABLET | ORAL | Status: AC
Start: 2016-05-23 — End: 2016-05-23
  Filled 2016-05-23: qty 1

## 2016-05-23 MED ORDER — ASPIRIN 81 MG PO CHEW: 81.0000 mg | CHEWABLE_TABLET | Freq: Every day | ORAL | 0 refills | Status: AC

## 2016-05-23 MED ORDER — DARBEPOETIN ALFA 200 MCG/0.4ML IJ SOSY
PREFILLED_SYRINGE | INTRAMUSCULAR | Status: AC
  Administered 2016-05-23: 200 ug via INTRAVENOUS
  Filled 2016-05-23: qty 0.4

## 2016-05-23 MED ORDER — TICAGRELOR 90 MG PO TABS: 90.0000 mg | ORAL_TABLET | Freq: Two times a day (BID) | ORAL | 0 refills | Status: DC

## 2016-05-23 MED ORDER — MIDODRINE HCL 2.5 MG PO TABS: 2.5000 mg | ORAL_TABLET | Freq: Three times a day (TID) | ORAL | 0 refills | Status: AC

## 2016-05-23 MED ORDER — PANTOPRAZOLE SODIUM 40 MG PO TBEC: 40.0000 mg | DELAYED_RELEASE_TABLET | Freq: Every day | ORAL | 0 refills | Status: DC

## 2016-05-23 NOTE — Procedures (Signed)
Patient was seen on dialysis and the procedure was supervised.  BFR 400  Via AVF BP is  111/56.   Patient appears to be tolerating treatment well  Jacob Fields A 05/23/2016

## 2016-05-23 NOTE — Progress Notes (Signed)
Can start tomorrow 12/8 at [facility] need to be there at 1015 to sign paperwork treatment starts at 1055

## 2016-05-23 NOTE — Progress Notes (Signed)
Contacted [facility] and arranged ambulance transport to [facility] (SNF).  Peri MarisAndrew Deovion Batrez, MBA, BSN, RN

## 2016-05-23 NOTE — Progress Notes (Signed)
Physical Therapy Treatment Patient Details Name: Jacob Fields MRN: [id number] DOB: 1940/09/03 Today's Date: Fields    History of Present Illness Pt adm with sepsis of unknown source. Pt also with Jacob afib. PMH - ESRD on HD, HTN, CAD, chf, gout, CVA, ileostomy    PT Comments    Patient progressing steadily.  Will need to work on determining the appropriate assistive device in SNF rehab.  Follow Up Recommendations  SNF     Equipment Recommendations  None recommended by PT    Recommendations for Other Services       Precautions / Restrictions Precautions Precautions: Fall    Mobility  Bed Mobility                  Transfers Overall transfer level: Needs assistance Equipment used: Rolling walker (2 wheeled) Transfers: Sit to/from Stand Sit to Stand: Supervision         General transfer comment: cues for safety  Ambulation/Gait Ambulation/Gait assistance: Min guard Ambulation Distance (Feet): 350 Feet Assistive device: Rolling walker (2 wheeled) Gait Pattern/deviations: Step-through pattern Gait velocity: generally slow, but can speed up on cue Gait velocity interpretation: at or above normal speed for age/gender General Gait Details: more steady with RW, narrower BOS, less deviation.   Stairs Stairs: Yes   Stair Management: Two rails;Step to pattern;Forwards Number of Stairs: 4 General stair comments: more tentative today  Wheelchair Mobility    Modified Rankin (Stroke Patients Only)       Balance Overall balance assessment: Needs assistance   Sitting balance-Leahy Scale: Good       Standing balance-Leahy Scale: Fair                      Cognition Arousal/Alertness: Awake/alert Behavior During Therapy: WFL for tasks assessed/performed Overall Cognitive Status: Impaired/Different from baseline (family relaying to pt that he has been confused.)                      Exercises      General Comments         Pertinent Vitals/Pain Pain Assessment: No/denies pain    Home Living                      Prior Function            PT Goals (current goals can now be found in the care plan section) Acute Rehab PT Goals Patient Stated Goal: to go home PT Goal Formulation: With patient/family Time For Goal Achievement: 05/31/16 Potential to Achieve Goals: Good Progress towards PT goals: Progressing toward goals    Frequency    Min 3X/week      PT Plan Current plan remains appropriate    Co-evaluation             End of Session   Activity Tolerance: Patient tolerated treatment well Patient left: in chair;with call bell/phone within reach;with family/visitor present     Time: [id number] PT Time Calculation (min) (ACUTE ONLY): 18 min  Charges:  $Gait Training: 8-22 mins                    G CodesEliseo Fields:      Jacob Fields  Jacob Fields, PT [phone]  (pager)

## 2016-05-23 NOTE — Progress Notes (Addendum)
Patient discharged [facility], report was called in to [facility]. IV and telemetry were discontinued. Family will call and make a follow up appointment with cardiologist. Transport took patient off the floor

## 2016-05-23 NOTE — Care Management Note (Signed)
Case Management Note  Patient Details  Name: Jacob MangoRobert M Fields MRN: [id number] Date of Birth: [birth year]  Subjective/Objective:   Septic shock d/t UTI, metabolic encephalopathy, New onset AFib, CHF                Action/Plan: Discharge Planning: AVS reviewed: Pt has been approved for dialysis center [facility] on [address] [facility] [place]. Notified CSW for SNF placement.   Late note 05/22/2016 1430 NCM spoke to pt and gave  permission to speak to dtr, Jacob GibbsMarti [phone]. Dtr states she does want him to relocate to [place]. Lives [place] home alone. Dtr is interested in [facility].     Expected Discharge Date:  05/23/2016               Expected Discharge Plan:  Skilled Nursing Facility  In-House Referral:  Clinical Social Work  Discharge planning Services  CM Consult, Medication Assistance  Post Acute Care Choice:  NA Choice offered to:  NA  DME Arranged:  N/A DME Agency:  NA  HH Arranged:  NA HH Agency:  NA  Status of Service:  Completed, signed off  If discussed at [employer] Meetings, dates discussed:    Additional Comments:  Elliot CousinShavis, Lorrin Bodner Ellen, RN 05/23/2016, 2:13 PM

## 2016-05-23 NOTE — Progress Notes (Signed)
Subjective:  Seen on HD- small goal- says cramping is better- has local HD spot but is unclear of plans as am I Objective Vital signs in last 24 hours: Vitals:   05/23/16 0800 05/23/16 0830 05/23/16 0900 05/23/16 0930  BP: (!) 96/49 (!) 91/53 (!) 106/52 (!) 81/49  Pulse: 71 69 75 76  Resp:      Temp:      TempSrc:      SpO2:      Weight:      Height:       Weight change: 0.636 kg (1 lb 6.5 oz)  Intake/Output Summary (Last 24 hours) at 05/23/16 0957 Last data filed at 05/23/16 [id number]  Gross per 24 hour  Intake               61 ml  Output              200 ml  Net             -139 ml    Assessment/ Plan: Pt is a 75 y.o. yo male ESRD who was admitted on 05/11/2016 with sepsis  Assessment/Plan: 1. Sepsis- unclear source but clinically improved 2. ESRD - normally [facility] via AVF- have been doing on schedule during this hospitalization 3. Anemia- hgb 10.0, on ESA 4. Secondary hyperparathyroidism- on phoslo - calc/phos good- last PTH 52 no vit D 5. HTN/volume- on midodrine- BP is soft, does not appear overloaded at all- small goals 6. NSTEMI/Afib- complicating current hosp- possible candidate for Watchman device- to be further evald as OP 7. Dispo- awaiting SNF placement- will need to make sure HD is part of those arrangements- [facility] have heard  Anayah Arvanitis A    Labs: Basic Metabolic Panel:  Recent Labs Lab 05/21/16 0641 05/22/16 0540 05/23/16 0310  NA 134* 134* 133*  K 3.2* 4.2 3.7  CL 96* 93* 96*  CO2 21* 29 20*  GLUCOSE 117* 129* 127*  BUN 57* 29* 50*  CREATININE 8.82* 5.63* 7.56*  CALCIUM 9.1 9.2 9.4  PHOS 7.0* 4.4 6.0*   Liver Function Tests:  Recent Labs Lab 05/21/16 0641 05/22/16 0540 05/23/16 0310  ALBUMIN 3.5 3.5 3.7   No results for input(s): LIPASE, AMYLASE in the last 168 hours. No results for input(s): AMMONIA in the last 168 hours. CBC:  Recent Labs Lab 05/19/16 0155 05/20/16 0608 05/21/16 0641  05/22/16 0540 05/23/16 0310  WBC 10.1 8.9 8.1 10.8* 12.0*  NEUTROABS 8.3* 6.9 6.5 8.5* 10.0*  HGB 10.2* 10.7* 10.2* 10.4* 10.0*  HCT 28.5* 30.7* 29.3* 30.3* 28.3*  MCV 106.3* 106.2* 105.4* 108.2* 107.2*  PLT 179 207 215 203 225   Cardiac Enzymes:  Recent Labs Lab 05/18/16 0249 05/18/16 0735 05/18/16 1512  TROPONINI 0.50* 0.43* 0.40*   CBG:  Recent Labs Lab 05/17/16 2213 05/18/16 1217 05/18/16 1634 05/18/16 2118 05/19/16 0800  GLUCAP 149* 99 120* 96 104*    Iron Studies: No results for input(s): IRON, TIBC, TRANSFERRIN, FERRITIN in the last 72 hours. Studies/Results: No results found. Medications: Infusions:   Scheduled Medications: Marland Kitchen. Darbepoetin Alfa      . midodrine      . aspirin  81 mg Oral Daily  . atorvastatin  80 mg Oral Daily  . calcium acetate  1,334 mg Oral TID WC  . darbepoetin (ARANESP) injection - DIALYSIS  200 mcg Intravenous Q Thu-HD  . febuxostat  20 mg Oral Daily  . midodrine  2.5 mg Oral  TID WC  . multivitamin  1 tablet Oral QHS  . potassium chloride  20 mEq Oral Once  . sodium chloride flush  3 mL Intravenous Q12H  . ticagrelor  90 mg Oral BID    have reviewed scheduled and prn medications.  Physical Exam: General: NAD Heart: RRR Lungs: clear Abdomen: soft, non tender Extremities: no edema Dialysis Access: left AVF slight bruising- patent    05/23/2016,9:57 AM  LOS: 12 days

## 2016-05-23 NOTE — Clinical Social Work Placement (Signed)
   CLINICAL SOCIAL WORK PLACEMENT  NOTE 05/23/16 - DISCHARGED TO [facility]  Date:  05/23/2016  Patient Details  Name: Jacob Fields MRN: [id number] Date of Birth: 1940/12/16  Clinical Social Work is seeking post-discharge placement for this patient at the Skilled  Nursing Facility level of care (*CSW will initial, date and re-position this form in  chart as items are completed):  Yes   Patient/family provided with [place] Clinical Social Work Department's list of facilities offering this level of care within the geographic area requested by the patient (or if unable, by the patient's family).  Yes   Patient/family informed of their freedom to choose among providers that offer the needed level of care, that participate in Medicare, Medicaid or managed care program needed by the patient, have an available bed and are willing to accept the patient.  Yes   Patient/family informed of [facility]'s ownership interest in [facility] and [facility], as well as of the fact that they are under no obligation to receive care at these facilities.  PASRR submitted to EDS on       PASRR number received on 05/18/16     Existing PASRR number confirmed on 05/18/16     FL2 transmitted to all facilities in geographic area requested by pt/family on 05/18/16     FL2 transmitted to all facilities within larger geographic area on       Patient informed that his/her managed care company has contracts with or will negotiate with certain facilities, including the following:         05/23/16 - Patient/family informed of bed offers received.  Patient [facility] bed at  [facility]     Physician recommends and patient chooses bed at      Patient to be transferred to  [facility] on  05/23/16.  Patient to be transferred to facility by  ambulance     Patient family notified on  05/23/16 of transfer.  Name of family member notified:   Daughter Dorothyann GibbsMarti at the bedside     PHYSICIAN Please sign FL2      Additional Comment:    _______________________________________________ Cristobal Goldmannrawford, Diondra Pines Bradley, LCSW 05/23/2016, 4:14 PM

## 2016-05-23 NOTE — Discharge Summary (Addendum)
Physician Discharge Summary  Jacob Fields ZOX:[id number] DOB: Nov 12, 1940 DOA: 05/11/2016  PCP: No primary care provider on file.  Admit date: 05/11/2016 Discharge date: 05/23/2016  Admitted From: Home  Disposition:  SNF   Recommendations for Outpatient Follow-up:  1. Follow up with PCP in 1- week 2. Patient has been placed on trazodone and amitriptyline has been discontinued 3. Aggrenox has been changed to brillinta  Home Health: Na  Equipment/Devices: Na   Discharge Condition: Stable  CODE STATUS: DNR Diet recommendation: Heart Healthy / renal dialysis   Brief/Interim Summary: This is a 75 year old male who presented to the hospital with a chief complaint of altered mental status and fever. Over last 7 days before admission he was noted to be more week. After his last hemodialysis session before admission to the hospital he developed worsening symptoms including significant decrease in responsiveness and fever. On initial physical examination he was noted to be tachycardic 140 bpm, tachypneic 24 breaths per minute and hypotensive 80/50. He was slow to respond, his lungs were clear to auscultation bilaterally, his heart had S1-S2 present irregularly irregular, his abdomen was soft, lower extremities had no significant edema. Sodium 138, potassium 3.3, chloride 101, bicarbonate 21, BUN 21, creatinine 4.74, glucose 132, white count 9.5, Humalog 10.7, hematocrit 29.9, platelets 157, lactic acid 6.2. His chest x-ray was negative for infiltrates, his urinalysis had pyuria, with too numerous to count white cells. His EKG showed atrial fibrillation with right bundle branch block, rapid ventricular response, 136 bpm.   The patient was admitted to the intensive care unit with working diagnosis of septic shock complicated by new onset atrial fibrillation with rapid response.  1. Septic shock, due to urinary tract infection, present on admission.. It was presumed to be related to urinary tract  infection, initially was placed on vasopressors, there were rapidly weaned off, he was placed on broad spectrum antibiotic therapy. His urine culture grew multiple species, blood cultures were no growth. Patient completed his antibiotic therapy in the hospital. Currently he is on midodrine 3 times a week. Due to potential interaction of midodrine with amitriptyline. [facility] place have been informed about this medication change.   2. Metabolic encephalopathy. Patient's mentation improved, patient received supportive care, no signs of acute CVA. Patient was seen by physical therapy with recommendations for skin ulcer facility.  3. New onset atrial fibrillation. Echocardiogram showed LV systolic function 40% with hypokinesis of the basal inferior lateral walls. Moderate aortic stenosis. Patient converted to a sinus rhythm with a first-degree AV block and persistent right bundle branch block. No further need of AV blockade. Patient had elevated troponins with suspected non-STEMI versus demand ischemia, brillinta was added. He was determined not to be a good candidate for full anticoagulation due to history of intracranial bleed. He will need further workup as an outpatient, possible stress test for risk stratification, also it  was recommended evaluation for " watchman device" for non valvular atrial fibrillation with contraindication to long-term anticoagulation.  4. Heart failure, systolic dysfunction with non-STEMI. Echocardiography with hypokinesis of the basal inferior lateral, suggest ischemic cardiomyopathy. Ejection fraction 40%. Patient will need outpatient follow-up with angiography or stress testing. Was placed on aspirin/ brillinta. Positive aortic stenosis.   5. Ulcerative colitis. Remained stable.  6. End-stage renal disease on hemodialysis. Patient received hemodialysis during this hospitalization with no major complications.   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   atorvastatin 80 MG tablet Commonly known as:  LIPITOR Take 80 mg by mouth daily.   calcium acetate 667 MG capsule Commonly known as:  PHOSLO Take 1,334 mg by mouth 3 (three) times daily with meals.   colchicine 0.6 MG tablet Take 1 tablet by mouth daily as needed.   fluocinonide cream 0.05 % Commonly known as:  LIDEX Apply 1 application topically daily as needed.   midodrine 2.5 MG tablet Commonly known as:  PROAMATINE Take 1 tablet (2.5 mg total) by mouth 3 (three) times daily with meals.   pantoprazole 40 MG tablet Commonly known as:  PROTONIX Take 1 tablet (40 mg total) by mouth daily.   RENA-VITE PO Take 1 tablet by mouth daily.   ticagrelor 90 MG Tabs tablet Commonly known as:  BRILINTA Take 1 tablet (90 mg total) by mouth 2 (two) times daily.   ULORIC PO Take 20 mg by mouth daily.      Follow-up Information    Primary Care Follow up in 1 week(s).          Allergies  Allergen Reactions  . Plavix [Clopidogrel]     sores    Consultations:  Nephrology    Procedures/Studies: Dg Chest 2 View  Result Date: 05/11/2016 CLINICAL DATA:  75 y/o  M; fevers and chills.  Pain all over chest. EXAM: CHEST  2 VIEW COMPARISON:   06/12/2013 chest radiograph. FINDINGS: Stable cardiac silhouette given projection and technique. Aortic atherosclerosis with arch calcification and dense calcification of subclavian artery is in the axilla. Stable linear opacities at left lung base may represent scarring or atelectasis. No focal consolidation. No pneumothorax or effusion. Chronic left-sided rib fractures IMPRESSION: Stable left basilar scarring. No new focal consolidation. No pneumothorax or effusion. Aortic atherosclerosis. Electronically Signed   By: Mitzi Hansen M.D.   On: 05/11/2016 22:13       Subjective: Patient feeling well, no nausea or vomiting, no chest pain or dyspnea.   Discharge Exam: Vitals:   05/23/16 1100 05/23/16 1134  BP: (!) 99/50 (!) 96/58  Pulse: 78 77  Resp: 13 14  Temp: 97.7 F (36.5 C) 97.7 F (36.5 C)   Vitals:   05/23/16 1000 05/23/16 1030 05/23/16 1100 05/23/16 1134  BP: (!) 111/56 (!) 86/57 (!) 99/50 (!) 96/58  Pulse: 76 86 78 77  Resp:   13 14  Temp:   97.7 F (36.5 C) 97.7 F (36.5 C)  TempSrc:   Oral Oral  SpO2:   100% 98%  Weight:   63.9 kg (140 lb 14 oz)   Height:        General: Pt is alert, awake, not in acute distress Cardiovascular: RRR, S1/S2 +, no rubs, no gallops Respiratory: CTA bilaterally, no wheezing, no rhonchi Abdominal: Soft, NT, ND,  bowel sounds + Extremities: no edema, no cyanosis    The results of significant diagnostics from this hospitalization (including imaging, microbiology, ancillary and laboratory) are listed below for reference.     Microbiology: Recent Results (from the past 240 hour(s))  Culture, Urine     Status: None   Collection Time: 05/17/16 12:28 PM  Result Value Ref Range Status   Specimen Description URINE, CLEAN CATCH  Final   Special Requests NONE  Final   Culture NO GROWTH  Final   Report Status 05/18/2016 FINAL  Final     Labs: BNP (last 3 results) No results for input(s): BNP in the last 8760 hours. Basic  Metabolic Panel:  Recent Labs Lab 05/18/16 0249 05/19/16 0155 05/20/16 0608 05/21/16 0641 05/22/16 0540 05/23/16 0310  NA 135 133*  134* 133* 134* 134* 133*  K 4.1 3.4*  3.3* 3.7 3.2* 4.2 3.7  CL 98* 93*  94* 95* 96* 93* 96*  CO2 22 25  25  20* 21* 29 20*  GLUCOSE 123* 167*  170* 132* 117* 129* 127*  BUN 45* 20  21* 40* 57* 29* 50*  CREATININE 6.45* 4.30*  4.35* 7.01* 8.82* 5.63* 7.56*  CALCIUM 9.7 8.9  9.1 9.2 9.1 9.2 9.4  MG 2.2 2.0 2.2 2.3  --  2.0  PHOS 4.8* 4.4 6.1* 7.0* 4.4 6.0*   Liver Function Tests:  Recent Labs Lab 05/19/16 0155 05/20/16 0608 05/21/16 0641 05/22/16 0540 05/23/16 0310  ALBUMIN 3.2* 3.6 3.5 3.5 3.7   No results for input(s): LIPASE, AMYLASE in the last 168 hours. No results for input(s): AMMONIA in the last 168 hours. CBC:  Recent Labs Lab 05/19/16 0155 05/20/16 0608 05/21/16 0641 05/22/16 0540 05/23/16 0310  WBC 10.1 8.9 8.1 10.8* 12.0*  NEUTROABS 8.3* 6.9 6.5 8.5* 10.0*  HGB 10.2* 10.7* 10.2* 10.4* 10.0*  HCT 28.5* 30.7* 29.3* 30.3* 28.3*  MCV 106.3* 106.2* 105.4* 108.2* 107.2*  PLT 179 207 215 203 225   Cardiac Enzymes:  Recent Labs Lab 05/18/16 0249 05/18/16 0735 05/18/16 1512  TROPONINI 0.50* 0.43* 0.40*   BNP: Invalid input(s): POCBNP CBG:  Recent Labs Lab 05/17/16 2213 05/18/16 1217 05/18/16 1634 05/18/16 2118 05/19/16 0800  GLUCAP 149* 99 120* 96 104*   D-Dimer No results for input(s): DDIMER in the last 72 hours. Hgb A1c No results for input(s): HGBA1C in the last 72 hours. Lipid Profile No results for input(s): CHOL, HDL, LDLCALC, TRIG, CHOLHDL, LDLDIRECT in the last 72 hours. Thyroid function studies No results for input(s): TSH, T4TOTAL, T3FREE, THYROIDAB in the last 72 hours.  Invalid input(s): FREET3 Anemia work up No results for input(s): VITAMINB12, FOLATE, FERRITIN, TIBC, IRON, RETICCTPCT in the last 72 hours. Urinalysis    Component Value Date/Time   COLORURINE YELLOW 05/17/2016  1229   APPEARANCEUR TURBID (A) 05/17/2016 1229   LABSPEC 1.020 05/17/2016 1229   PHURINE 7.0 05/17/2016 1229   GLUCOSEU NEGATIVE 05/17/2016 1229   HGBUR LARGE (A) 05/17/2016 1229   BILIRUBINUR NEGATIVE 05/17/2016 1229   KETONESUR 15 (A) 05/17/2016 1229   PROTEINUR 100 (A) 05/17/2016 1229   UROBILINOGEN 0.2 06/12/2013 1807   NITRITE NEGATIVE 05/17/2016 1229   LEUKOCYTESUR LARGE (A) 05/17/2016 1229   Sepsis Labs Invalid input(s): PROCALCITONIN,  WBC,  LACTICIDVEN Microbiology Recent Results (from the past 240 hour(s))  Culture, Urine     Status: None   Collection Time: 05/17/16 12:28 PM  Result Value Ref Range Status   Specimen Description URINE, CLEAN CATCH  Final  Special Requests NONE  Final   Culture NO GROWTH  Final   Report Status 05/18/2016 FINAL  Final     Time coordinating discharge: 45 minutes  SIGNED:   Coralie KeensMauricio Daniel Mikeala Girdler, MD  [facility] 05/23/2016, 12:44 PM Pager   If 7PM-7AM, please contact night-coverage [email or web address] Password [email or web address]

## 2016-05-29 ENCOUNTER — Encounter: Payer: Self-pay | Admitting: Internal Medicine

## 2016-05-29 ENCOUNTER — Non-Acute Institutional Stay (SKILLED_NURSING_FACILITY): Payer: Medicare Other | Admitting: Internal Medicine

## 2016-05-29 DIAGNOSIS — D72828 Other elevated white blood cell count: Secondary | ICD-10-CM

## 2016-05-29 DIAGNOSIS — I480 Paroxysmal atrial fibrillation: Secondary | ICD-10-CM | POA: Diagnosis not present

## 2016-05-29 DIAGNOSIS — E7849 Other hyperlipidemia: Secondary | ICD-10-CM

## 2016-05-29 DIAGNOSIS — Z992 Dependence on renal dialysis: Secondary | ICD-10-CM

## 2016-05-29 DIAGNOSIS — I214 Non-ST elevation (NSTEMI) myocardial infarction: Secondary | ICD-10-CM

## 2016-05-29 DIAGNOSIS — D638 Anemia in other chronic diseases classified elsewhere: Secondary | ICD-10-CM

## 2016-05-29 DIAGNOSIS — N186 End stage renal disease: Secondary | ICD-10-CM | POA: Diagnosis not present

## 2016-05-29 DIAGNOSIS — E784 Other hyperlipidemia: Secondary | ICD-10-CM

## 2016-05-29 DIAGNOSIS — N2581 Secondary hyperparathyroidism of renal origin: Secondary | ICD-10-CM | POA: Diagnosis not present

## 2016-05-29 DIAGNOSIS — I959 Hypotension, unspecified: Secondary | ICD-10-CM

## 2016-05-29 DIAGNOSIS — K5100 Ulcerative (chronic) pancolitis without complications: Secondary | ICD-10-CM | POA: Diagnosis not present

## 2016-05-29 DIAGNOSIS — M1030 Gout due to renal impairment, unspecified site: Secondary | ICD-10-CM

## 2016-05-29 DIAGNOSIS — I5042 Chronic combined systolic (congestive) and diastolic (congestive) heart failure: Secondary | ICD-10-CM | POA: Diagnosis not present

## 2016-05-29 DIAGNOSIS — G4709 Other insomnia: Secondary | ICD-10-CM

## 2016-05-29 DIAGNOSIS — R5381 Other malaise: Secondary | ICD-10-CM

## 2016-05-29 NOTE — Progress Notes (Signed)
[facility]  PCP: No primary care provider on file.   Code Status: Full Code  Goals of care: Advanced Directive information Advanced Directives 05/11/2016  Does Patient Have a Medical Advance Directive? No  Would patient like information on creating a medical advance directive? No - Patient declined  Pre-existing out of facility DNR order (yellow form or pink MOST form) -       Extended Emergency Contact Information Primary Emergency Contact: Epimenio FootLangholz,Marti  [place] Mobile Phone: [phone] Relation: Daughter   Allergies  Allergen Reactions  . Plavix [Clopidogrel]     sores    Chief Complaint  Patient presents with  . New Admit To SNF    New Admission Visit      HPI:  Patient is a 75 y.o. male seen today for short term rehabilitation post hospital admission from 05/11/16-05/23/16 with altered mental state and fever and was diagnosed to have septic shock with UTI. He required vasopressors and antibiotic. He also had NSTEMI and acute systolic CHF with new onset afib. Echocardiogram showed EF 40%. He had medical management and cardiology follow up was recommended. He has medical history of ulcerative colitis, ESRD on HD. He is seen in his room today.   Review of Systems:  Constitutional: Negative for fever, chills, diaphoresis. Energy level is slowly coming back.   HENT: Negative for headache, congestion, nasal discharge, sore throat, difficulty swallowing.   Eyes: Negative for blurred vision, double vision and discharge. Wears glasses.  Respiratory: Negative for cough, dyspnea and wheezing. Positive for some shortness of breath at dialysis 2 days back.  Cardiovascular: Negative for chest pain, palpitations, leg swelling.  Gastrointestinal: Negative for heartburn, nausea, vomiting, abdominal pain. Genitourinary: Negative for dysuria and flank pain.  Musculoskeletal: Negative for back pain, fall in the facility.  Skin: Negative for  itching, rash.  Neurological: Negative for dizziness. Psychiatric/Behavioral: Negative for depression.   Past Medical History:  Diagnosis Date  . Gout   . Hypertension   . Renal disorder   . Stroke [facility] Fall(HCC)    Past Surgical History:  Procedure Laterality Date  . CAROTID ENDARTERECTOMY    . CHOLECYSTECTOMY    . COLON SURGERY    . TONSILLECTOMY     Social History:   reports that he has never smoked. He has never used smokeless tobacco. He reports that he does not drink alcohol or use drugs.  Family History  Problem Relation Age of Onset  . Stroke Mother   . Stroke Brother   . CAD Brother     Medications:   Medication List       Accurate as of 05/29/16 11:09 AM. Always use your most recent med list.          amitriptyline 25 MG tablet Commonly known as:  ELAVIL Take 75 mg by mouth at bedtime.   aspirin 81 MG chewable tablet Chew 1 tablet (81 mg total) by mouth daily.   atorvastatin 80 MG tablet Commonly known as:  LIPITOR Take 80 mg by mouth daily.   calcium acetate 667 MG capsule Commonly known as:  PHOSLO Take 1,334 mg by mouth 3 (three) times daily with meals.   colchicine 0.6 MG tablet Take 1 tablet by mouth daily as needed.   fluocinonide cream 0.05 % Commonly known as:  LIDEX Apply 1 application topically daily as needed.   midodrine 2.5 MG tablet Commonly known as:  PROAMATINE Take 1 tablet (2.5 mg total) by mouth 3 (three) times  daily with meals.   RENA-VITE PO Take 1 tablet by mouth daily.   ticagrelor 90 MG Tabs tablet Commonly known as:  BRILINTA Take 1 tablet (90 mg total) by mouth 2 (two) times daily.   ULORIC PO Take 20 mg by mouth daily.       Immunizations:  There is no immunization history on file for this patient.   Physical Exam: Vitals:   05/29/16 1100  BP: (!) 142/86  Pulse: 88  Resp: 20  Temp: 98 F (36.7 C)  TempSrc: Oral  SpO2: 98%  Weight: 140 lb 12.8 oz (63.9 kg)  Height: 5\' 6"  (1.676 m)   Body mass  index is 22.73 kg/m.  General- elderly male, well built, in no acute distress Head- normocephalic, atraumatic Nose- no nasal discharge Throat- moist mucus membrane  Eyes- PERRLA, EOMI, no pallor, no icterus, no discharge, normal conjunctiva, normal sclera Neck- no cervical lymphadenopathy Cardiovascular- normal s1,s2, no murmur, no leg edema Respiratory- bilateral clear to auscultation, no wheeze, no rhonchi, no crackles, no use of accessory muscles Abdomen- bowel sounds present, soft, non tender, ostomy site clean and dry Musculoskeletal- able to move all 4 extremities, generalized weakness, on wheelchair Neurological- alert and oriented to person, place and time Skin- warm and dry, left arm AV fistula with good thrill Psychiatry- normal mood and affect    Labs reviewed: Basic Metabolic Panel:  Recent Labs  16/10/96 0608 05/21/16 0641 05/22/16 0540 05/23/16 0310  NA 133* 134* 134* 133*  K 3.7 3.2* 4.2 3.7  CL 95* 96* 93* 96*  CO2 20* 21* 29 20*  GLUCOSE 132* 117* 129* 127*  BUN 40* 57* 29* 50*  CREATININE 7.01* 8.82* 5.63* 7.56*  CALCIUM 9.2 9.1 9.2 9.4  MG 2.2 2.3  --  2.0  PHOS 6.1* 7.0* 4.4 6.0*   Liver Function Tests:  Recent Labs  05/11/16 2058 05/12/16 0340  05/21/16 0641 05/22/16 0540 05/23/16 0310  AST 38 45*  --   --   --   --   ALT 21 23  --   --   --   --   ALKPHOS 58 51  --   --   --   --   BILITOT 1.5* 1.0  --   --   --   --   PROT 7.6 6.3*  --   --   --   --   ALBUMIN 4.1 3.3*  < > 3.5 3.5 3.7  < > = values in this interval not displayed. No results for input(s): LIPASE, AMYLASE in the last 8760 hours. No results for input(s): AMMONIA in the last 8760 hours. CBC:  Recent Labs  05/21/16 0641 05/22/16 0540 05/23/16 0310  WBC 8.1 10.8* 12.0*  NEUTROABS 6.5 8.5* 10.0*  HGB 10.2* 10.4* 10.0*  HCT 29.3* 30.3* 28.3*  MCV 105.4* 108.2* 107.2*  PLT 215 203 225   Cardiac Enzymes:  Recent Labs  05/18/16 0249 05/18/16 0735 05/18/16 1512    TROPONINI 0.50* 0.43* 0.40*   BNP: Invalid input(s): POCBNP CBG:  Recent Labs  05/18/16 1634 05/18/16 2118 05/19/16 0800  GLUCAP 120* 96 104*    Radiological Exams: Dg Chest 2 View  Result Date: 05/11/2016 CLINICAL DATA:  75 y/o  M; fevers and chills.  Pain all over chest. EXAM: CHEST  2 VIEW COMPARISON:  06/12/2013 chest radiograph. FINDINGS: Stable cardiac silhouette given projection and technique. Aortic atherosclerosis with arch calcification and dense calcification of subclavian artery is in the axilla. Stable linear  opacities at left lung base may represent scarring or atelectasis. No focal consolidation. No pneumothorax or effusion. Chronic left-sided rib fractures IMPRESSION: Stable left basilar scarring. No new focal consolidation. No pneumothorax or effusion. Aortic atherosclerosis. Electronically Signed   By: Mitzi HansenLance  Furusawa-Stratton M.D.   On: 05/11/2016 22:13    Assessment/Plan  Physical deconditioning With recent hospitalization for sepsis. Will have him work with physical therapy and occupational therapy team to help with gait training and muscle strengthening exercises.fall precautions. Skin care. Encourage to be out of bed.   NSTEMI Chest pain free. Continue ticagrelor 90 mg bid and baby aspirin. Continue statin. Pending outpatient angiogram  afib Rate currently controlled. Pt to f/u with Dr Jacinto HalimGanji for evaluation for watchman device for his non valvular afib as he is not anticoagulation candidate  Chronic combined diastolic and Systolic chf EF 40%. Not on b blocker or ACEI with low BP. Monitor clinically.   Leukocytosis Afebrile. Monitor wbc and temp curve  Hypotension  On HD. Continue midodrine 2.5 mg tid. Monitor bp.   Secondary hyperparathyroidism With ESRD. Continue phoslo  Ulcerative colitis Continue ostomy care. Stable  Anemia of chronic disease Monitor cbc  ESRD On HD 3 days a week. Continue phoslo and renavite  HLD Continue  atorvastatin  Other insomnia Was on amitriptyline, currently off it and to be on trazodone. Not on any trazodone at time of discharge per med list.  Patient denies any trouble with sleep at present. Monitor clinically  Gout No recent flare up, continue uloric acid daily and colchicine on need basis    Goals of care: short term rehabilitation   Labs/tests ordered: cbc, bmp 05/30/16  Family/ staff Communication: reviewed care plan with patient and nursing supervisor    Oneal GroutMAHIMA Zyra Parrillo, MD Internal Medicine [facility] [address] [place], [place] [address] Cell Phone (Monday-Friday 8 am - 5 pm): [phone] On Call: [phone] and follow prompts after 5 pm and on weekends Office Phone: [phone] Office Fax: [phone]

## 2016-06-12 ENCOUNTER — Encounter: Payer: Self-pay | Admitting: Family

## 2016-06-12 ENCOUNTER — Non-Acute Institutional Stay (SKILLED_NURSING_FACILITY): Payer: Medicare Other | Admitting: Family

## 2016-06-12 DIAGNOSIS — Z992 Dependence on renal dialysis: Secondary | ICD-10-CM | POA: Diagnosis not present

## 2016-06-12 DIAGNOSIS — I5042 Chronic combined systolic (congestive) and diastolic (congestive) heart failure: Secondary | ICD-10-CM | POA: Diagnosis not present

## 2016-06-12 DIAGNOSIS — R2681 Unsteadiness on feet: Secondary | ICD-10-CM

## 2016-06-12 DIAGNOSIS — N186 End stage renal disease: Secondary | ICD-10-CM | POA: Diagnosis not present

## 2016-06-12 DIAGNOSIS — I1311 Hypertensive heart and chronic kidney disease without heart failure, with stage 5 chronic kidney disease, or end stage renal disease: Secondary | ICD-10-CM

## 2016-06-13 NOTE — Progress Notes (Signed)
Location:  [facility] Room Number: 807-P Place of Service:  SNF (31)  Provider: Richarda Blade FNP-C   PCP: No primary care provider on file. No care team member to display  Extended Emergency Contact Information Primary Emergency Contact: Epimenio Foot [place] Mobile Phone: [phone] Relation: Daughter  Code Status: Full code  Goals of care:  Advanced Directive information Advanced Directives 05/11/2016  Does Patient Have a Medical Advance Directive? No  Would patient like information on creating a medical advance directive? No - Patient declined  Pre-existing out of facility DNR order (yellow form or pink MOST form) -     Allergies  Allergen Reactions  . Plavix [Clopidogrel]     sores    Chief Complaint  Patient presents with  . Discharge Note    HPI:  75 y.o. Fields seen today at [facility] for discharge home.He was here for short term rehabilitation post hospital admission from 05/11/16-Jacob/7/17 with altered mental state and fever and was diagnosed to have septic shock with UTI. He treated with vasopressors and antibiotic.He also had NSTEMI and acute systolic CHF with new onset afib. Echocardiogram showed EF 40%. He had medical management and cardiology follow up was recommended.He has a medical history of HTN, ESRD on dialysis, CHF, stroke among other conditions. He is seen in his room today. He denies any acute issues this visit. He has worked well with PT/OT now stable for discharge home.He will be discharged home with Home health PT/OT to continue with ROM, Exercise, Gait stability and muscle strengthening.He will require  DME Rollator to allow her to maintain current level of independence with ADL's. Home health services will be arranged by facility social worker prior to discharge. Prescription medication will be written x 1 month then patient to follow up with PCP in 1-2 weeks. Facility staff report  no new concerns.      Past Medical History:  Diagnosis Date  . Gout   . Hypertension   . Renal disorder   . Stroke [facility])     Past Surgical History:  Procedure Laterality Date  . CAROTID ENDARTERECTOMY    . CHOLECYSTECTOMY    . COLON SURGERY    . TONSILLECTOMY        reports that he has never smoked. He has never used smokeless tobacco. He reports that he does not drink alcohol or use drugs. Social History   Social History  . Marital status: Divorced    Spouse name: N/A  . Number of children: N/A  . Years of education: N/A   Occupational History  . Not on file.   Social History Main Topics  . Smoking status: Never Smoker  . Smokeless tobacco: Never Used  . Alcohol use No  . Drug use: No  . Sexual activity: Not Currently   Other Topics Concern  . Not on file   Social History Narrative  . No narrative on file    Allergies  Allergen Reactions  . Plavix [Clopidogrel]     sores    Pertinent  Health Maintenance Due  Topic Date Due  . INFLUENZA VACCINE  01/16/2016  . PNA vac Low Risk Adult  Completed    Medications: Allergies as of Jacob/27/2017      Reactions   Plavix [clopidogrel]    sores      Medication List       Accurate as of Jacob/27/17 11:59 PM. Always use your most recent med  list.          aspirin 81 MG chewable tablet Chew 1 tablet (81 mg total) by mouth daily.   atorvastatin 80 MG tablet Commonly known as:  LIPITOR Take 80 mg by mouth daily.   calcium acetate 667 MG capsule Commonly known as:  PHOSLO Take 1,334 mg by mouth 3 (three) times daily with meals.   colchicine 0.6 MG tablet Take 1 tablet by mouth daily as needed.   fluocinonide cream 0.05 % Commonly known as:  LIDEX Apply 1 application topically daily as needed.   loratadine 10 MG tablet Commonly known as:  CLARITIN Take 10 mg by mouth daily.   midodrine 2.5 MG tablet Commonly known as:  PROAMATINE Take 1 tablet (2.5 mg total) by mouth 3 (three) times daily with  meals.   RENA-VITE PO Take 1 tablet by mouth daily.   ticagrelor 90 MG Tabs tablet Commonly known as:  BRILINTA Take 1 tablet (90 mg total) by mouth 2 (two) times daily.   ULORIC PO Take 20 mg by mouth daily.       Review of Systems  Constitutional: Negative for activity change, appetite change, chills, fatigue and fever.  HENT: Negative for congestion, rhinorrhea, sinus pain, sinus pressure, sneezing and sore throat.   Eyes: Negative.   Respiratory: Negative for cough, chest tightness, shortness of breath and wheezing.   Cardiovascular: Negative for chest pain, palpitations and leg swelling.  Gastrointestinal: Negative for abdominal distention, abdominal pain, constipation, diarrhea, nausea and vomiting.  Endocrine: Negative.   Genitourinary: Negative for flank pain and urgency.       ESRD on dialysis   Musculoskeletal: Positive for gait problem.  Skin: Negative for color change, pallor and rash.  Neurological: Negative for dizziness, seizures, syncope, light-headedness and headaches.  Hematological: Does not bruise/bleed easily.  Psychiatric/Behavioral: Negative for agitation, confusion, hallucinations and sleep disturbance. The patient is not nervous/anxious.     Vitals:   Jacob/27/17 1154  BP: 126/70  Pulse: 70  Resp: 17  Temp: 97 F (36.1 C)  TempSrc: Oral  SpO2: 98%  Weight: 140 lb 14 oz (63.9 kg)  Height: 5\' 6"  (1.676 m)   Body mass index is 22.74 kg/m. Physical Exam  Constitutional: He is oriented to person, place, and time. He appears well-developed and well-nourished. No distress.  HENT:  Head: Normocephalic.  Mouth/Throat: Oropharynx is clear and moist. No oropharyngeal exudate.  Eyes: Conjunctivae and EOM are normal. Pupils are equal, round, and reactive to light. Right eye exhibits no discharge. Left eye exhibits no discharge. No scleral icterus.  Neck: Normal range of motion. No JVD present. No thyromegaly present.  Cardiovascular: Normal rate, regular  rhythm, normal heart sounds and intact distal pulses.  Exam reveals no gallop and no friction rub.   No murmur heard. Pulmonary/Chest: Effort normal and breath sounds normal. No respiratory distress. He has no wheezes. He has no rales.  Abdominal: Soft. Bowel sounds are normal. He exhibits no distension. There is no tenderness. There is no rebound and no guarding.  RLQ ileostomy patent   Genitourinary:  Genitourinary Comments: ESRD  Musculoskeletal: He exhibits no edema, tenderness or deformity.  Moves x 4 extremities. Gait unsteady.   Lymphadenopathy:    He has no cervical adenopathy.  Neurological: He is oriented to person, place, and time.  Skin: Skin is warm and dry. No rash noted. No erythema. No pallor.  1. Left arm AV fistula positive for bruit and thrill.  2. RLQ ileostomy stoma moist  and pink in color. Surrounding skin tissue without any signs of infections.   Psychiatric: He has a normal mood and affect.    Labs reviewed: Basic Metabolic Panel:  Recent Labs  16/10/96 0608 Jacob/05/17 0641 Jacob/06/17 0540 Jacob/07/17 0310  NA 133* 134* 134* 133*  K 3.7 3.2* 4.2 3.7  CL 95* 96* 93* 96*  CO2 20* 21* 29 20*  GLUCOSE 132* 117* 129* 127*  BUN 40* 57* 29* 50*  CREATININE 7.01* 8.82* 5.63* 7.56*  CALCIUM 9.2 9.1 9.2 9.4  MG 2.2 2.3  --  2.0  PHOS 6.1* 7.0* 4.4 6.0*   Liver Function Tests:  Recent Labs  05/11/16 2058 05/12/16 0340  Jacob/05/17 0641 Jacob/06/17 0540 Jacob/07/17 0310  AST 38 45*  --   --   --   --   ALT 21 23  --   --   --   --   ALKPHOS 58 51  --   --   --   --   BILITOT 1.5* 1.0  --   --   --   --   PROT 7.6 6.3*  --   --   --   --   ALBUMIN 4.1 3.3*  < > 3.5 3.5 3.7  < > = values in this interval not displayed.  CBC:  Recent Labs  Jacob/05/17 0641 Jacob/06/17 0540 Jacob/07/17 0310  WBC 8.1 10.8* Jacob.0*  NEUTROABS 6.5 8.5* 10.0*  HGB 10.2* 10.4* 10.0*  HCT 29.3* 30.3* 28.3*  MCV 105.4* 108.2* 107.2*  PLT 215 203 225   Cardiac Enzymes:  Recent Labs   Jacob/02/17 0249 Jacob/02/17 0735 Jacob/02/17 1512  TROPONINI 0.50* 0.43* 0.40*    Recent Labs  Jacob/02/17 1634 Jacob/02/17 2118 Jacob/03/17 0800  GLUCAP 120* 96 104*   Assessment/Plan:   1. 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 Currently off medication. Continue to monitor. BMP in 1-2 weeks with PCP.    2. Chronic combined systolic and diastolic CHF (congestive heart failure) Stable. Status post short term rehabilitation post hospital admission from 05/11/16-Jacob/7/17 with altered mental state and fever and was diagnosed to have septic shock with UTI. He treated with vasopressors and antibiotic.He also had NSTEMI and acute systolic CHF with new onset afib. Echocardiogram showed EF 40%. He had medical management. Follow up with cardiology as directed.monitor weight daily.   3. Unsteady gait Has worked well with PT/ OT. Will discharge home PT/OT to continue with ROM, Exercise, Gait stability and muscle strengthening.He will require DME Rollator to allow her to maintain current level of independence with ADL's. Fall and safety precautions.   4. ESRD (end stage renal disease) on dialysis (HCC) Continue on dialysis.   Patient is being discharged with the following home health services:   -PT/OT for ROM, Exercise, Gait stability and muscle strengthening   Patient is being discharged with the following durable medical equipment:   -Rollator to allow him to maintain current level of independence with ADL's unable to achieve with FWW or cane.    Patient has been advised to f/u with their PCP in 1-2 weeks to for a transitions of care visit.  Social services at their facility was responsible for arranging this appointment.Pt was provided with adequate prescriptions of noncontrolled medications to reach the scheduled appointment.For controlled substances, a limited supply was provided as appropriate for the individual patient.  If the pt  normally receives these medications from a pain clinic or has a Community education officer  with another physician, these medications should be received from that clinic or physician only).    Future labs/tests needed:  CBC, BMP in 1-2 weeks with PCP

## 2016-07-14 ENCOUNTER — Encounter (HOSPITAL_COMMUNITY): Payer: Self-pay | Admitting: Emergency Medicine

## 2016-07-14 ENCOUNTER — Emergency Department (HOSPITAL_COMMUNITY)
Admission: EM | Admit: 2016-07-14 | Discharge: 2016-07-14 | Disposition: A | Discharge status: Home / Self Care | Payer: Medicare Other | Attending: Emergency Medicine | Admitting: Emergency Medicine

## 2016-07-14 DIAGNOSIS — Z79899 Other long term (current) drug therapy: Secondary | ICD-10-CM | POA: Insufficient documentation

## 2016-07-14 DIAGNOSIS — I5042 Chronic combined systolic (congestive) and diastolic (congestive) heart failure: Secondary | ICD-10-CM | POA: Insufficient documentation

## 2016-07-14 DIAGNOSIS — N390 Urinary tract infection, site not specified: Secondary | ICD-10-CM | POA: Diagnosis not present

## 2016-07-14 DIAGNOSIS — Z7982 Long term (current) use of aspirin: Secondary | ICD-10-CM | POA: Diagnosis not present

## 2016-07-14 DIAGNOSIS — Z8673 Personal history of transient ischemic attack (TIA), and cerebral infarction without residual deficits: Secondary | ICD-10-CM | POA: Insufficient documentation

## 2016-07-14 DIAGNOSIS — N186 End stage renal disease: Secondary | ICD-10-CM | POA: Diagnosis not present

## 2016-07-14 DIAGNOSIS — IMO0002 Reserved for concepts with insufficient information to code with codable children: Secondary | ICD-10-CM

## 2016-07-14 DIAGNOSIS — I132 Hypertensive heart and chronic kidney disease with heart failure and with stage 5 chronic kidney disease, or end stage renal disease: Secondary | ICD-10-CM | POA: Diagnosis not present

## 2016-07-14 DIAGNOSIS — R339 Retention of urine, unspecified: Secondary | ICD-10-CM | POA: Diagnosis present

## 2016-07-14 LAB — URINALYSIS, ROUTINE W REFLEX MICROSCOPIC
BILIRUBIN URINE: NEGATIVE
Bacteria, UA: NONE SEEN
Glucose, UA: NEGATIVE mg/dL
Ketones, ur: NEGATIVE mg/dL
NITRITE: NEGATIVE
PROTEIN: 100 mg/dL — AB
Specific Gravity, Urine: 1.011 (ref 1.005–1.030)
Squamous Epithelial / LPF: NONE SEEN
pH: 8 (ref 5.0–8.0)

## 2016-07-14 LAB — CBC WITH DIFFERENTIAL/PLATELET
BASOS ABS: 0 10*3/uL (ref 0.0–0.1)
BASOS PCT: 0 %
EOS ABS: 0.1 10*3/uL (ref 0.0–0.7)
EOS PCT: 1 %
HCT: 25.9 % — ABNORMAL LOW (ref 39.0–52.0)
Hemoglobin: 8.4 g/dL — ABNORMAL LOW (ref 13.0–17.0)
Lymphocytes Relative: 9 %
Lymphs Abs: 0.5 10*3/uL — ABNORMAL LOW (ref 0.7–4.0)
MCH: 34.4 pg — ABNORMAL HIGH (ref 26.0–34.0)
MCHC: 32.4 g/dL (ref 30.0–36.0)
MCV: 106.1 fL — ABNORMAL HIGH (ref 78.0–100.0)
Monocytes Absolute: 0.4 10*3/uL (ref 0.1–1.0)
Monocytes Relative: 8 %
Neutro Abs: 4.2 10*3/uL (ref 1.7–7.7)
Neutrophils Relative %: 82 %
PLATELETS: 161 10*3/uL (ref 150–400)
RBC: 2.44 MIL/uL — AB (ref 4.22–5.81)
RDW: 13.9 % (ref 11.5–15.5)
WBC: 5.2 10*3/uL (ref 4.0–10.5)

## 2016-07-14 LAB — I-STAT CHEM 8, ED
BUN: 26 mg/dL — ABNORMAL HIGH (ref 6–20)
CALCIUM ION: 1.31 mmol/L (ref 1.15–1.40)
CHLORIDE: 104 mmol/L (ref 101–111)
Creatinine, Ser: 7.2 mg/dL — ABNORMAL HIGH (ref 0.61–1.24)
Glucose, Bld: 94 mg/dL (ref 65–99)
HCT: 23 % — ABNORMAL LOW (ref 39.0–52.0)
Hemoglobin: 7.8 g/dL — ABNORMAL LOW (ref 13.0–17.0)
POTASSIUM: 5.1 mmol/L (ref 3.5–5.1)
SODIUM: 138 mmol/L (ref 135–145)
TCO2: 25 mmol/L (ref 0–100)

## 2016-07-14 MED ORDER — LEVOFLOXACIN 750 MG PO TABS: 750.0000 mg | ORAL_TABLET | Freq: Every day | ORAL | 0 refills | Status: AC

## 2016-07-14 MED ORDER — LEVOFLOXACIN 750 MG PO TABS
750.0000 mg | ORAL_TABLET | Freq: Once | ORAL | Status: AC
  Administered 2016-07-14: 750 mg via ORAL
  Filled 2016-07-14: qty 1

## 2016-07-14 NOTE — ED Notes (Signed)
Patient in gown, on continuous pulse oximetry and blood pressure cuff; Donnamae JudeKeshia, RN assisted me with in and out cath; attempt was unsuccessful; Donnamae JudeKeshia, RN notifying Rubin PayorPickering, MD; visitors now back at bedside

## 2016-07-14 NOTE — ED Provider Notes (Signed)
Care assumed from Dr. Rubin PayorPickering.  At time of transfer of care, patient is awaiting urinalysis to look for urinary tract infection. If patient has evidence of infection, plan to give antibiotics. Clinically, nursing was concerned about appearance looking like a urinary tract infection.  Previous team spoke with urology who felt patient would be stable for discharge. They report patient can follow-up with him in clinic for further management.  Urinalysis returned showing leukocytes and turbid appearance. There are too numerous to count white blood cells but no evidence of bacteria seen. Given the symptoms and the leukocytes, patient will be treated for urinary tract infection.  Anticipate discharge after antibiotic administration.  Patient discharged after antibiotics will follow up with  Urology. Patient discharged in good condition.   Jacob Brimhristopher J Nhung Danko, MD 07/15/16 2114

## 2016-07-14 NOTE — Discharge Instructions (Addendum)
Return to the hospital for fevers or worsening pain.  Please call to schedule appointment with your urologist for dilation and further management of your urinary tract infection. He received a dose of antibiotics today, please start sure prescription antibiotics tomorrow. If any signs or symptoms of worsening infection or sepsis and, please return to the nearest emergency department.

## 2016-07-14 NOTE — ED Provider Notes (Signed)
[facility]-EMERGENCY DEPT Provider Note   CSN: [id number] Arrival date & time: 07/14/16  1059     History   Chief Complaint Chief Complaint  Patient presents with  . Urinary Retention    HPI Jacob Fields is a 76 y.o. male.  HPI  Patient presents with dysuria and pain with attempting to urinate for the last week. He is a dialysis patient and is dialyzed Monday Wednesday Friday. Patient states it is painful to even try to urinate. No fevers. States he only urinates about twice a week at baseline. No fevers. No pain with having a bowel movement but states he only goes into his ileostomy.Patient has what sounded like a urethral dilator that he had been using at home. States he can no longer get this to pass.   Past Medical History:  Diagnosis Date  . Gout   . Hypertension   . Renal disorder   . Stroke [facility](HCC)     Patient Active Problem List   Diagnosis Date Noted  . Other insomnia   . Subdural hematoma (HCC)   . Atrial fibrillation (HCC)   . NSTEMI (non-ST elevated myocardial infarction) (HCC)   . Chronic combined systolic and diastolic CHF (congestive heart failure) (HCC)   . 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 Septic shock (HCC) 05/11/2016  . Hypokalemia 05/11/2016  . 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 Physical deconditioning 06/14/2013  . UTI (urinary tract infection) 06/12/2013  . Acute encephalopathy 06/12/2013  . Gout attack 06/12/2013  . ESRD (end stage renal disease) on dialysis (HCC) 06/12/2013  . 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 Anemia 06/12/2013  . Thrombocytopenia (HCC) 06/12/2013    Past Surgical History:  Procedure Laterality Date  . CAROTID ENDARTERECTOMY    . CHOLECYSTECTOMY    . COLON SURGERY    . TONSILLECTOMY         Home Medications    Prior to Admission medications   Medication Sig Start Date End Date Taking?  Authorizing Provider  atorvastatin (LIPITOR) 80 MG tablet Take 80 mg by mouth daily.   Yes Historical Provider, MD  B Complex-C-Folic Acid (RENA-VITE PO) Take 1 tablet by mouth daily.   Yes Historical Provider, MD  calcium acetate (PHOSLO) 667 MG capsule Take 1,334 mg by mouth 3 (three) times daily with meals.    Yes Historical Provider, MD  colchicine 0.6 MG tablet Take 1 tablet by mouth daily as needed.    Yes Historical Provider, MD  Febuxostat (ULORIC PO) Take 20 mg by mouth daily.    Yes Historical Provider, MD  fluocinonide cream (LIDEX) 0.05 % Apply 1 application topically daily as needed. 04/18/16  Yes Historical Provider, MD  loratadine (CLARITIN) 10 MG tablet Take 10 mg by mouth daily.   Yes Historical Provider, MD  ticagrelor (BRILINTA) 90 MG TABS tablet Take 1 tablet (90 mg total) by mouth 2 (two) times daily. 05/23/16  Yes Mauricio Annett Gulaaniel Arrien, MD  aspirin 81 MG chewable tablet Chew 1 tablet (81 mg total) by mouth daily. Patient not taking: Reported on 07/14/2016 05/24/16   Coralie KeensMauricio Daniel Arrien, MD  midodrine (PROAMATINE) 2.5 MG tablet Take 1 tablet (2.5 mg total) by mouth 3 (three) times daily with meals. Patient not taking: Reported on 07/14/2016 05/23/16   Coralie KeensMauricio Daniel Arrien, MD    Family History Family History  Problem Relation Age of Onset  . Stroke Mother   .  Stroke Brother   . CAD Brother     Social History Social History  Substance Use Topics  . Smoking status: Never Smoker  . Smokeless tobacco: Never Used  . Alcohol use No     Allergies   Plavix [clopidogrel]   Review of Systems Review of Systems  Constitutional: Negative for appetite change.  HENT: Negative for congestion.   Respiratory: Negative for cough.   Cardiovascular: Negative for chest pain.  Gastrointestinal: Negative for abdominal pain.  Genitourinary: Positive for dysuria.  Musculoskeletal: Negative for back pain.  Neurological: Negative for numbness and headaches.  Hematological:  Negative for adenopathy.  Psychiatric/Behavioral: Negative for confusion.     Physical Exam Updated Vital Signs BP 113/66   Pulse 66   Temp 98.4 F (36.9 C) (Oral)   Resp 18   Ht 5\' 6"  (1.676 m)   Wt 145 lb (65.8 kg)   SpO2 100%   BMI 23.40 kg/m   Physical Exam  Constitutional: He appears well-developed and well-nourished.  HENT:  Head: Atraumatic.  Eyes: EOM are normal.  Neck: Neck supple.  Cardiovascular: Normal rate.   Pulmonary/Chest: Effort normal.  Abdominal: He exhibits no mass.  Ileostomy in right lower quadrant. Mild suprapubic tenderness.  Musculoskeletal: He exhibits no edema.  Dialysis graft left upper extremity.  Neurological: He is alert.  Skin: Skin is warm. Capillary refill takes less than 2 seconds.  Psychiatric: He has a normal mood and affect.     ED Treatments / Results  Labs (all labs ordered are listed, but only abnormal results are displayed) Labs Reviewed  CBC WITH DIFFERENTIAL/PLATELET - Abnormal; Notable for the following:       Result Value   RBC 2.44 (*)    Hemoglobin 8.4 (*)    HCT 25.9 (*)    MCV 106.1 (*)    MCH 34.4 (*)    Lymphs Abs 0.5 (*)    All other components within normal limits  I-STAT CHEM 8, ED - Abnormal; Notable for the following:    BUN 26 (*)    Creatinine, Ser 7.20 (*)    Hemoglobin 7.8 (*)    HCT 23.0 (*)    All other components within normal limits  URINE CULTURE  URINALYSIS, ROUTINE W REFLEX MICROSCOPIC    EKG  EKG Interpretation None       Radiology No results found.  Procedures Procedures (including critical care time)  Medications Ordered in ED Medications - No data to display   Initial Impression / Assessment and Plan / ED Course  I have reviewed the triage vital signs and the nursing notes.  Pertinent labs & imaging results that were available during my care of the patient were reviewed by me and considered in my medical decision making (see chart for details).     Patient with  pain with attempted urination and inability to urinate. He is on dialysis and urinates about twice a week. Previous history of meatal stricture had been self dilating. Unable to pass that now. Unable to pass in and out catheter here. Does have some urine the bladder but not inordinately large amount. Renal function at baseline. White count not elevated. Will discuss with urology.  Discussed with Dr. Mena Goes. Does not appear to be septic at this time and unlike the severe infection. Will likely need dilation through the office. Call the office to follow-up this week. If shows infection on the small amount of urine he was able to give [place] me needed dose of  antibiotics and culture.  Final Clinical Impressions(s) / ED Diagnoses   Final diagnoses:  Urinary retention  Stricture of urethral meatus    New Prescriptions New Prescriptions   No medications on file     Benjiman Core, MD 07/14/16 1547

## 2016-07-14 NOTE — ED Triage Notes (Signed)
Pt. Stated, I can't pee. Its been going on for a week. Pt went to dialysis on Friday and reached his dry weight. Painful to even try.

## 2016-07-14 NOTE — ED Notes (Signed)
Assisted patient with urinal; patient did provide an urine specimen; also emptied patient's colostomy bag; patient is resting at this time; visitor at bedside

## 2016-07-16 LAB — URINE CULTURE

## 2016-07-17 ENCOUNTER — Other Ambulatory Visit: Payer: Self-pay | Admitting: Urology

## 2016-07-17 ENCOUNTER — Telehealth: Payer: Self-pay | Admitting: Emergency Medicine

## 2016-07-17 NOTE — Progress Notes (Signed)
ED Antimicrobial Stewardship Positive Culture Follow Up   Jacob MangoRobert M Fields is an 76 y.o. male who [facility] with a chief complaint of  Chief Complaint  Patient presents with  . Urinary Retention    Recent Results (from the past 720 hour(s))  Urine culture     Status: Abnormal   Collection Time: 07/14/16  3:25 PM  Result Value Ref Range Status   Specimen Description URINE, RANDOM  Final   Special Requests NONE  Final   Culture >=100,000 COLONIES/mL ESCHERICHIA COLI (A)  Final   Report Status 07/16/2016 FINAL  Final   Organism ID, Bacteria ESCHERICHIA COLI (A)  Final      Susceptibility   Escherichia coli - MIC*    AMPICILLIN >=32 RESISTANT Resistant     CEFAZOLIN <=4 SENSITIVE Sensitive     CEFTRIAXONE <=1 SENSITIVE Sensitive     CIPROFLOXACIN >=4 RESISTANT Resistant     GENTAMICIN <=1 SENSITIVE Sensitive     IMIPENEM <=0.25 SENSITIVE Sensitive     NITROFURANTOIN <=16 SENSITIVE Sensitive     TRIMETH/SULFA >=320 RESISTANT Resistant     AMPICILLIN/SULBACTAM >=32 RESISTANT Resistant     PIP/TAZO <=4 SENSITIVE Sensitive     Extended ESBL NEGATIVE Sensitive     * >=100,000 COLONIES/mL ESCHERICHIA COLI    [x]  Treated with levaquin, organism resistant to prescribed antimicrobial   New antibiotic prescription: keflex 250 mg PO daily for 7 days  ED Provider: Melburn HakeNicole Nadeau, PA-C   Winferd HumphreyJoseph Joshawn Crissman, BS, PharmD Clinical Pharmacy Resident [phone] (Pager) 07/17/2016 8:40 AM

## 2016-07-17 NOTE — Telephone Encounter (Signed)
Post ED Visit - Positive Culture Follow-up: Successful Patient Follow-Up  Culture assessed and recommendations reviewed by: []  Enzo BiNathan Batchelder, Pharm.D. []  Celedonio MiyamotoJeremy Frens, Pharm.D., BCPS []  Garvin FilaMike Maccia, Pharm.D. []  Georgina PillionElizabeth Martin, Pharm.D., BCPS []  AltheimerMinh Pham, [address] BoulevardPharm.D., BCPS, AAHIVP []  Estella HuskMichelle Turner, Pharm.D., BCPS, AAHIVP []  Tennis Mustassie Stewart, Pharm.D. []  Sherle Poeob Vincent, [place]. Joe Arminger PharmD  Positive urine culture  []  Patient discharged without antimicrobial prescription and treatment is now indicated [x]  Organism is resistant to prescribed ED discharge antimicrobial []  Patient with positive blood cultures  Changes discussed with ED provider: Melburn HakeNicole Nadeau PA New antibiotic prescription stop levaquin, start keflex 250mg  po daily x 7 days Called to [facility] [address]  Contacted EC 07/17/2016 1420   Jacob MullMiller, Jacob Fields 07/17/2016, 2:18 PM

## 2016-07-25 ENCOUNTER — Ambulatory Visit (INDEPENDENT_AMBULATORY_CARE_PROVIDER_SITE_OTHER): Payer: Medicare Other | Admitting: Neurology

## 2016-07-25 ENCOUNTER — Encounter: Payer: Self-pay | Admitting: Neurology

## 2016-07-25 VITALS — BP 123/72 | HR 42 | Resp 20 | Ht 66.0 in | Wt 141.0 lb

## 2016-07-25 DIAGNOSIS — I480 Paroxysmal atrial fibrillation: Secondary | ICD-10-CM

## 2016-07-25 DIAGNOSIS — Z8673 Personal history of transient ischemic attack (TIA), and cerebral infarction without residual deficits: Secondary | ICD-10-CM | POA: Diagnosis not present

## 2016-07-25 DIAGNOSIS — Z8619 Personal history of other infectious and parasitic diseases: Secondary | ICD-10-CM

## 2016-07-25 DIAGNOSIS — F0150 Vascular dementia without behavioral disturbance: Secondary | ICD-10-CM | POA: Diagnosis not present

## 2016-07-25 NOTE — Patient Instructions (Signed)
You have complaints of memory loss: memory loss or changes in cognitive function can have many reasons and does not always mean you have dementia. Conditions that can contribute to subjective or objective memory loss include: depression, stress, poor sleep from insomnia or sleep apnea, dehydration, fluctuation in blood sugar values, thyroid or electrolyte dysfunction and certain vitamin deficiencies. Dementia can be caused by stroke, brain atherosclerosis or brain vascular disease due to vascular risk factors (smoking, high blood pressure, high cholesterol, obesity and uncontrolled diabetes), certain degenerative brain disorders (including Parkinson's disease and Multiple sclerosis) and by Alzheimer's disease or other, more rare and sometimes hereditary causes. We will do some additional testing: We will do a brain scan, called MRI. We will not start medication for memory as yet. You have risk factors for vascular dementia.  Try to find out why you are on the amitriptyline, you were supposed to stop it after the hospital stay in Nov.

## 2016-07-25 NOTE — Progress Notes (Signed)
Subjective:    Patient ID: [name] is a 76 y.o. male.  HPI     Jacob Foley, MD, PhD [facility] [address], [address] [address] [place], [place] [address]  Dear Dr. Arrie Aran,   I saw your patient, Jacob Fields, upon your kind request in my neurologic clinic today for initial consultation of his memory loss. The patient is accompanied by his daughter today. As you know, Jacob Fields is a 76 year old right-handed gentleman with an underlying complex medical history of end-stage renal disease, on hemodialysis, recurrent UTIs, kidney stones, TIA, CVA, right carotid endarterectomy, seizure disorder, heart disease, status post stent placement, ulcerative colitis, gout, A. fib, and recent hospitalization secondary to altered mental status, secondary to sepsis in the context of UTI, who has had memory loss for the past years, drastically worse after and during his recent prolonged hospitalization and ICU stay.  I reviewed your office records from 07/12/2016. Also reviewed his discharge summary and hospital records from his November hospital admission, he was admitted on 05/11/2016 and discharged on 05/23/2016. He was diagnosed with new onset A. fib and suspected to have a non-STEMI. He had a head CT without contrast on 06/12/2013 which I reviewed: IMPRESSION: 1. No acute intracranial pathology seen on CT. 2. Mild to moderate cortical volume loss and scattered small vessel ischemic microangiopathy. 3. Small chronic infarct at the high right parietal lobe, with mild associated encephalomalacia; small chronic lacunar infarcts within the basal ganglia bilaterally, and at the left thalamus.  He had a recent memory assessment through his occupational therapist, he scored 18 out of 30 on the MOCA scale on 06/27/2016.   Hx of Sz in the past. Not on med for it now, may have been on Dilantin.  He is a retired Teacher, early years/pre, no overt FHx of dementia. Non-smoker, no alcohol, never  heavy drinker, no daily caffeine.  Still on amitriptyline, was supposed to stop it. He used to be on 3 pills at night, I'm assuming that this was for sleep. Per daughter, who provides all of the Hx, he used to live alone, lived about 2 hours away from here and visited for Thanksgiving last year, but got sick. After his hospital stay he was in inpatient rehabilitation at [facility] for about a month and was discharged to home on 06/15/2016 and has been staying with his daughter, Jacob Fields and her husband since then. They are looking into assisted living facilities and so far liked [facility]. He has been divorced for years. He has 2 sons, one in [place], [place] and the other in [place]. All children have healthcare power of attorney. He has hemodialysis on Monday, Wednesday and Fridays, he has been on dialysis for about 2 years. He had a dialysis fistula for the past several years, maybe since 2001. He had multiple surgeries including lithotripsy, knee replacement surgery, cholecystectomy, fistula placements, colectomy, right carotid endarterectomy. Denies any significant depression or anxiety but had evidence of delirium during his hospitalization per daughter.   His Past Medical History Is Significant For: Past Medical History:  Diagnosis Date  . Gout   . Hypertension   . Renal disorder   . Stroke [facility])     His Past Surgical History Is Significant For: Past Surgical History:  Procedure Laterality Date  . CAROTID ENDARTERECTOMY    . CHOLECYSTECTOMY    . COLON SURGERY    . TONSILLECTOMY      His Family History Is Significant For: Family History  Problem  Relation Age of Onset  . Stroke Mother   . Stroke Brother   . CAD Brother     His Social History Is Significant For: Social History   Social History  . Marital status: Divorced    Spouse name: N/A  . Number of children: N/A  . Years of education: N/A   Social History Main Topics  . Smoking status: Never Smoker  .  Smokeless tobacco: Never Used  . Alcohol use No  . Drug use: No  . Sexual activity: Not Currently   Other Topics Concern  . None   Social History Narrative  . None    His Allergies Are:  Allergies  Allergen Reactions  . Plavix [Clopidogrel]     sores  :   His Current Medications Are:  Outpatient Encounter Prescriptions as of 07/25/2016  Medication Sig  . amitriptyline (ELAVIL) 25 MG tablet Take 25 mg by mouth at bedtime.  Marland Kitchen aspirin 81 MG chewable tablet Chew 1 tablet (81 mg total) by mouth daily.  Marland Kitchen atorvastatin (LIPITOR) 80 MG tablet Take 80 mg by mouth daily.  . B Complex-C-Folic Acid (RENA-VITE PO) Take 1 tablet by mouth daily.  . calcium acetate (PHOSLO) 667 MG capsule Take 1,334 mg by mouth 3 (three) times daily with meals.   . colchicine 0.6 MG tablet Take 1 tablet by mouth daily as needed.   . Febuxostat (ULORIC PO) Take 20 mg by mouth daily.   . fluocinonide cream (LIDEX) 0.05 % Apply 1 application topically daily as needed.  . loratadine (CLARITIN) 10 MG tablet Take 10 mg by mouth daily.  . midodrine (PROAMATINE) 2.5 MG tablet Take 1 tablet (2.5 mg total) by mouth 3 (three) times daily with meals.  . ticagrelor (BRILINTA) 90 MG TABS tablet Take 1 tablet (90 mg total) by mouth 2 (two) times daily.   No facility-administered encounter medications on file as of 07/25/2016.   :  Review of Systems:  Out of a complete 14 point review of systems, all are reviewed and negative with the exception of these symptoms as listed below:  Review of Systems  Neurological: Positive for dizziness, speech difficulty and weakness.       Pt presents today to discuss his memory. Pt's daughter reports that an OT evaluation discovered he may have memory loss.    Objective:  Neurologic Exam  Physical Exam Physical Examination:   Vitals:   07/25/16 1135  BP: 123/72  Pulse: (!) 42  Resp: 20   General Examination: The patient is a very pleasant 76 y.o. male in no acute distress. He  is calm and cooperative with the exam. He mildly frail and deconditioned appearing. He is quiet. He is well groomed.  HEENT: Normocephalic, atraumatic, pupils are equal, round and reactive to light and accommodation. Extraocular tracking shows mild saccadic breakdown without nystagmus noted. Hearing is mildly impaired. Face is symmetric. Speech is soft but not dysarthric. Airway exam reveals mild to moderate mouth dryness, mild airway crowding. Tongue and palate are central. Right-sided carotid endarterectomy scar.  Chest: is clear to auscultation without wheezing, rhonchi or crackles noted.  Heart: sounds are mildly irregular, cannot be sure if in A. fib.   Abdomen: is soft, non-tender and non-distended with normal bowel sounds appreciated on auscultation.  Extremities: There is no pitting edema in the distal lower extremities bilaterally. Pedal pulses are intact.   Skin: is warm and dry with no trophic changes noted. Age-related changes are noted on the skin. Mild bruising  on skin.  Musculoskeletal: exam reveals no obvious joint deformities. Dialysis fistula left upper arm.   Neurologically:  Mental status: The patient is awake and alert, paying good  attention. He is unable to provide the history. His daughter provides the entire history essentially. He is oriented to day, season, situation, state, city, county. He is insecure about dates and sequences of events.  On 07/25/2016: MMSE 21/30, CDT: 2/4, AFT: 8/min.    Cranial nerves are as described above under HEENT exam.  Motor exam:  Thin bulk, normal tone, global strength of 4 out of 5. Fine motor skills are globally mildly impaired. Reflexes are about 1+ throughout, sensory exam nonfocal in the upper and lower extremities. He stands with difficulty, posture is age-appropriate to mildly stooped. He walks with a rolling walker. He has difficulty standing. He takes smaller steps, balance is impaired. Turns slowly. Romberg and tandem walk are  not testable safely.  Assessment and Plan:   In summary, Jacob Fields is a very pleasant 76 y.o.-year old male with an underlying complex medical history of end-stage renal disease, on hemodialysis, recurrent UTIs, kidney stones, TIA, CVA, right carotid endarterectomy, seizure disorder, heart disease, status post stent placement, ulcerative colitis, gout, A. fib, and recent hospitalization secondary to altered mental status, secondary to sepsis in the context of UTI, who has had memory loss for the past years, drastically worse after and during his recent prolonged hospitalization and ICU stay. He has abnormal memory test results today. He has multiple risk factors for vascular dementia. 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  I had a long chat with the patient and Jacob GibbsMarti about my findings and the diagnosis of memory loss and dementia, its prognosis and treatment options. Implications of diagnosis explained at length with the patient and his daughter, I agree with their plan to transition him to assisted living. All children have a healthcare power of attorney. We will proceed with brain MRI testing. I suggested we hold off on any new medications at this time but I would like to suggest to taper off of amitriptyline and he was advised to come off of his after his hospital stay. They were encouraged to talk to the prescribing physician about the amitriptyline. We talked about maintaining a healthy lifestyle in general and staying active mentally and physically.  We will call with the MRI results and make a follow-up appointment soon. I answered all the questions today and the patient and his daughter were in agreement.   Thank you very much for allowing me to participate in the care of this nice patient. If I can be of any further assistance to you please do not hesitate to call me at [phone].  Sincerely,   Jacob FoleySaima Avayah Raffety, MD, PhD

## 2016-07-29 ENCOUNTER — Encounter (HOSPITAL_BASED_OUTPATIENT_CLINIC_OR_DEPARTMENT_OTHER): Payer: Self-pay | Admitting: *Deleted

## 2016-07-30 ENCOUNTER — Ambulatory Visit: Payer: Medicare Other | Admitting: Neurology

## 2016-07-30 ENCOUNTER — Encounter (HOSPITAL_BASED_OUTPATIENT_CLINIC_OR_DEPARTMENT_OTHER): Payer: Self-pay | Admitting: *Deleted

## 2016-07-30 NOTE — Progress Notes (Addendum)
SPOKE W/ PT'S DAUGHTER, Jacob Fields.  PT HAS DEMENTIA.  NPO AFTER MN W/ EXCEPTION CLEAR LIQUIDS UNTIL 0730 (NO CREAM /MILK PRODUCTS).  ARRIVE AT 1200.  NEEDS ISTAT 8.  WILL TAKE ULORIC AND MIDODRINE AM DOS W/ SIPS OF WATER.  TO BE FAXED FROM DR Jacinto HalimGANJI , CARDIOLOGIST, OFFICE LAST OFFICE NOTE, ECHO, AND CARDIAC CATH. RESULTS.  WILL REVIEW CHART W/ ANESTHESIOLOGIST WHEN RECEIVE FAX.  ADDENDUM:  AFTER RECEIVING INFORMATION FROM DR Jacinto HalimGANJI OFFICE , SPOKE TO AND REVIEWED CHART W/ DR HODIERNE MDA VIA PHONE.  DR Chaney MallingHODIERNE STATED PT WOULD NEED CARDIAC CLEARANCE BEFORE HAVING PROCEDURE AND IF CLEARANCE GIVEN HE WOULD BE A CANDIDIATE TO BE DONE AT SURGERY CENTER. CALLED AND LM VIA PHONE FOR SELITA, OR [facility].  ADDENDUM:  RECEIVED FAX FROM DR [facility] LPMANNY OFFICE.  FROM PT'S CARDIOLOGIST, DR Jacinto HalimGANJI, PT GIVEN CLEARANCE FOR SURGERY WITH INTERMEDIATE RISK.  PLACED IN PT'S CHART.

## 2016-08-01 ENCOUNTER — Encounter (HOSPITAL_BASED_OUTPATIENT_CLINIC_OR_DEPARTMENT_OTHER)
Admission: RE | Disposition: A | Discharge status: Home / Self Care | Payer: Self-pay | Source: Ambulatory Visit | Attending: Urology

## 2016-08-01 ENCOUNTER — Ambulatory Visit (HOSPITAL_BASED_OUTPATIENT_CLINIC_OR_DEPARTMENT_OTHER): Payer: Medicare Other | Admitting: Anesthesiology

## 2016-08-01 ENCOUNTER — Encounter (HOSPITAL_BASED_OUTPATIENT_CLINIC_OR_DEPARTMENT_OTHER): Payer: Self-pay | Admitting: Anesthesiology

## 2016-08-01 ENCOUNTER — Ambulatory Visit (HOSPITAL_BASED_OUTPATIENT_CLINIC_OR_DEPARTMENT_OTHER)
Admission: RE | Admit: 2016-08-01 | Discharge: 2016-08-01 | Disposition: A | Discharge status: Home / Self Care | Payer: Medicare Other | Source: Ambulatory Visit | Attending: Urology | Admitting: Urology

## 2016-08-01 DIAGNOSIS — I480 Paroxysmal atrial fibrillation: Secondary | ICD-10-CM | POA: Insufficient documentation

## 2016-08-01 DIAGNOSIS — Z9049 Acquired absence of other specified parts of digestive tract: Secondary | ICD-10-CM | POA: Insufficient documentation

## 2016-08-01 DIAGNOSIS — I132 Hypertensive heart and chronic kidney disease with heart failure and with stage 5 chronic kidney disease, or end stage renal disease: Secondary | ICD-10-CM | POA: Insufficient documentation

## 2016-08-01 DIAGNOSIS — I2582 Chronic total occlusion of coronary artery: Secondary | ICD-10-CM | POA: Insufficient documentation

## 2016-08-01 DIAGNOSIS — M109 Gout, unspecified: Secondary | ICD-10-CM | POA: Diagnosis not present

## 2016-08-01 DIAGNOSIS — I4510 Unspecified right bundle-branch block: Secondary | ICD-10-CM | POA: Insufficient documentation

## 2016-08-01 DIAGNOSIS — R627 Adult failure to thrive: Secondary | ICD-10-CM | POA: Insufficient documentation

## 2016-08-01 DIAGNOSIS — N200 Calculus of kidney: Secondary | ICD-10-CM | POA: Insufficient documentation

## 2016-08-01 DIAGNOSIS — I252 Old myocardial infarction: Secondary | ICD-10-CM | POA: Insufficient documentation

## 2016-08-01 DIAGNOSIS — I5022 Chronic systolic (congestive) heart failure: Secondary | ICD-10-CM | POA: Insufficient documentation

## 2016-08-01 DIAGNOSIS — I350 Nonrheumatic aortic (valve) stenosis: Secondary | ICD-10-CM | POA: Diagnosis not present

## 2016-08-01 DIAGNOSIS — F0390 Unspecified dementia without behavioral disturbance: Secondary | ICD-10-CM | POA: Diagnosis not present

## 2016-08-01 DIAGNOSIS — Q549 Hypospadias, unspecified: Secondary | ICD-10-CM | POA: Diagnosis not present

## 2016-08-01 DIAGNOSIS — Z955 Presence of coronary angioplasty implant and graft: Secondary | ICD-10-CM | POA: Diagnosis not present

## 2016-08-01 DIAGNOSIS — Z932 Ileostomy status: Secondary | ICD-10-CM | POA: Insufficient documentation

## 2016-08-01 DIAGNOSIS — Z8249 Family history of ischemic heart disease and other diseases of the circulatory system: Secondary | ICD-10-CM | POA: Insufficient documentation

## 2016-08-01 DIAGNOSIS — I739 Peripheral vascular disease, unspecified: Secondary | ICD-10-CM | POA: Insufficient documentation

## 2016-08-01 DIAGNOSIS — I2510 Atherosclerotic heart disease of native coronary artery without angina pectoris: Secondary | ICD-10-CM | POA: Diagnosis not present

## 2016-08-01 DIAGNOSIS — D631 Anemia in chronic kidney disease: Secondary | ICD-10-CM | POA: Diagnosis not present

## 2016-08-01 DIAGNOSIS — N186 End stage renal disease: Secondary | ICD-10-CM | POA: Insufficient documentation

## 2016-08-01 DIAGNOSIS — Z992 Dependence on renal dialysis: Secondary | ICD-10-CM | POA: Insufficient documentation

## 2016-08-01 DIAGNOSIS — Z8673 Personal history of transient ischemic attack (TIA), and cerebral infarction without residual deficits: Secondary | ICD-10-CM | POA: Insufficient documentation

## 2016-08-01 DIAGNOSIS — N3080 Other cystitis without hematuria: Secondary | ICD-10-CM | POA: Diagnosis not present

## 2016-08-01 DIAGNOSIS — N359 Urethral stricture, unspecified: Secondary | ICD-10-CM | POA: Diagnosis present

## 2016-08-01 DIAGNOSIS — Z8719 Personal history of other diseases of the digestive system: Secondary | ICD-10-CM | POA: Insufficient documentation

## 2016-08-01 DIAGNOSIS — Z9842 Cataract extraction status, left eye: Secondary | ICD-10-CM | POA: Insufficient documentation

## 2016-08-01 DIAGNOSIS — Z9109 Other allergy status, other than to drugs and biological substances: Secondary | ICD-10-CM | POA: Insufficient documentation

## 2016-08-01 DIAGNOSIS — Z87440 Personal history of urinary (tract) infections: Secondary | ICD-10-CM | POA: Insufficient documentation

## 2016-08-01 DIAGNOSIS — Z9841 Cataract extraction status, right eye: Secondary | ICD-10-CM | POA: Insufficient documentation

## 2016-08-01 DIAGNOSIS — Z961 Presence of intraocular lens: Secondary | ICD-10-CM | POA: Insufficient documentation

## 2016-08-01 HISTORY — DX: Arteriovenous fistula, acquired: I77.0

## 2016-08-01 HISTORY — DX: Nonrheumatic aortic (valve) stenosis: I35.0

## 2016-08-01 HISTORY — DX: Atherosclerotic heart disease of native coronary artery without angina pectoris: I25.10

## 2016-08-01 HISTORY — DX: Personal history of other diseases of the circulatory system: Z86.79

## 2016-08-01 HISTORY — DX: Unspecified right bundle-branch block: I45.10

## 2016-08-01 HISTORY — DX: Personal history of other infectious and parasitic diseases: Z86.19

## 2016-08-01 HISTORY — DX: Ileostomy status: Z93.2

## 2016-08-01 HISTORY — DX: Delirium due to known physiological condition: F05

## 2016-08-01 HISTORY — DX: Other symptoms and signs involving the musculoskeletal system: R29.898

## 2016-08-01 HISTORY — DX: Ulcerative colitis, unspecified, without complications: K51.90

## 2016-08-01 HISTORY — DX: Vascular dementia, unspecified severity, without behavioral disturbance, psychotic disturbance, mood disturbance, and anxiety: F01.50

## 2016-08-01 HISTORY — DX: Chronic kidney disease, unspecified: N18.9

## 2016-08-01 HISTORY — DX: Personal history of urinary (tract) infections: Z87.440

## 2016-08-01 HISTORY — DX: Paroxysmal atrial fibrillation: I48.0

## 2016-08-01 HISTORY — DX: Personal history of other specified conditions: Z87.898

## 2016-08-01 HISTORY — DX: Unspecified urethral stricture, male, unspecified site: N35.919

## 2016-08-01 HISTORY — DX: Presence of coronary angioplasty implant and graft: Z95.5

## 2016-08-01 HISTORY — DX: Presence of spectacles and contact lenses: Z97.3

## 2016-08-01 HISTORY — DX: Dependence on renal dialysis: Z99.2

## 2016-08-01 HISTORY — DX: Personal history of transient ischemic attack (TIA), and cerebral infarction without residual deficits: Z86.73

## 2016-08-01 HISTORY — DX: Chronic systolic (congestive) heart failure: I50.22

## 2016-08-01 HISTORY — DX: Anemia in chronic kidney disease: D63.1

## 2016-08-01 HISTORY — DX: Peripheral vascular disease, unspecified: I73.9

## 2016-08-01 HISTORY — PX: CYSTOSCOPY WITH URETHRAL DILATATION: SHX5125

## 2016-08-01 HISTORY — DX: Atrioventricular block, first degree: I44.0

## 2016-08-01 HISTORY — DX: Dependence on renal dialysis: N18.6

## 2016-08-01 HISTORY — DX: Presence of dental prosthetic device (complete) (partial): Z97.2

## 2016-08-01 HISTORY — DX: Old myocardial infarction: I25.2

## 2016-08-01 LAB — POCT I-STAT, CHEM 8
BUN: 20 mg/dL (ref 6–20)
CALCIUM ION: 1.16 mmol/L (ref 1.15–1.40)
CHLORIDE: 95 mmol/L — AB (ref 101–111)
CREATININE: 4.8 mg/dL — AB (ref 0.61–1.24)
GLUCOSE: 110 mg/dL — AB (ref 65–99)
HCT: 31 % — ABNORMAL LOW (ref 39.0–52.0)
HEMOGLOBIN: 10.5 g/dL — AB (ref 13.0–17.0)
POTASSIUM: 4.8 mmol/L (ref 3.5–5.1)
Sodium: 137 mmol/L (ref 135–145)
TCO2: 30 mmol/L (ref 0–100)

## 2016-08-01 SURGERY — CYSTOSCOPY, WITH URETHRAL DILATION
Anesthesia: Monitor Anesthesia Care

## 2016-08-01 MED ORDER — PROPOFOL 10 MG/ML IV BOLUS
INTRAVENOUS | Status: AC
  Filled 2016-08-01: qty 20

## 2016-08-01 MED ORDER — STERILE WATER FOR IRRIGATION IR SOLN
Status: DC | PRN
  Administered 2016-08-01: 3000 mL

## 2016-08-01 MED ORDER — IOHEXOL 300 MG/ML  SOLN
INTRAMUSCULAR | Status: DC | PRN
  Administered 2016-08-01: 20 mL via INTRAVENOUS

## 2016-08-01 MED ORDER — FENTANYL CITRATE (PF) 100 MCG/2ML IJ SOLN
INTRAMUSCULAR | Status: DC | PRN
  Administered 2016-08-01 (×2): 25 ug via INTRAVENOUS

## 2016-08-01 MED ORDER — ONDANSETRON HCL 4 MG/2ML IJ SOLN
INTRAMUSCULAR | Status: DC | PRN
  Administered 2016-08-01: 4 mg via INTRAVENOUS

## 2016-08-01 MED ORDER — FENTANYL CITRATE (PF) 100 MCG/2ML IJ SOLN
INTRAMUSCULAR | Status: AC
  Filled 2016-08-01: qty 2

## 2016-08-01 MED ORDER — GENTAMICIN IN SALINE 1.6-0.9 MG/ML-% IV SOLN
80.0000 mg | INTRAVENOUS | Status: AC
  Administered 2016-08-01: 80 mg via INTRAVENOUS
  Filled 2016-08-01 (×3): qty 50

## 2016-08-01 MED ORDER — HYDROMORPHONE HCL 1 MG/ML IJ SOLN
0.2500 mg | INTRAMUSCULAR | Status: DC | PRN
  Filled 2016-08-01: qty 0.5

## 2016-08-01 MED ORDER — TRAMADOL HCL 50 MG PO TABS: 50.0000 mg | ORAL_TABLET | Freq: Four times a day (QID) | ORAL | 0 refills | Status: DC | PRN

## 2016-08-01 MED ORDER — LIDOCAINE HCL 2 % EX GEL
CUTANEOUS | Status: DC | PRN
  Administered 2016-08-01: 1

## 2016-08-01 MED ORDER — LIDOCAINE 2% (20 MG/ML) 5 ML SYRINGE
INTRAMUSCULAR | Status: DC | PRN
  Administered 2016-08-01: 20 mg via INTRAVENOUS

## 2016-08-01 MED ORDER — SODIUM CHLORIDE 0.9 % IV SOLN
INTRAVENOUS | Status: DC
Start: 2016-08-01 — End: 2016-08-01
  Administered 2016-08-01: 13:00:00 via INTRAVENOUS
  Filled 2016-08-01: qty 1000

## 2016-08-01 MED ORDER — PROMETHAZINE HCL 25 MG/ML IJ SOLN
6.2500 mg | INTRAMUSCULAR | Status: DC | PRN
  Filled 2016-08-01: qty 1

## 2016-08-01 MED ORDER — ONDANSETRON HCL 4 MG/2ML IJ SOLN
INTRAMUSCULAR | Status: AC
  Filled 2016-08-01: qty 2

## 2016-08-01 MED ORDER — PROPOFOL 500 MG/50ML IV EMUL
INTRAVENOUS | Status: DC | PRN
  Administered 2016-08-01: 25 ug/kg/min via INTRAVENOUS

## 2016-08-01 MED ORDER — LIDOCAINE 2% (20 MG/ML) 5 ML SYRINGE
INTRAMUSCULAR | Status: AC
  Filled 2016-08-01: qty 5

## 2016-08-01 MED FILL — traMADol HCL 50 MG TABS: 50 | 3 days supply | Qty: 10 | Fill #0

## 2016-08-01 SURGICAL SUPPLY — 31 items
BAG DRAIN URO-CYSTO SKYTR STRL (DRAIN) ×3 IMPLANT
BAG URINE DRAINAGE (UROLOGICAL SUPPLIES) IMPLANT
BAG URINE LEG 19OZ MD ST LTX (BAG) IMPLANT
BALLN NEPHROSTOMY (BALLOONS) ×3
BALLOON NEPHROSTOMY (BALLOONS) ×1 IMPLANT
CATH FOLEY 2W COUNCIL 20FR 5CC (CATHETERS) IMPLANT
CATH FOLEY 2W COUNCIL 5CC 16FR (CATHETERS) IMPLANT
CATH FOLEY 2W COUNCIL 5CC 18FR (CATHETERS) IMPLANT
CATH FOLEY 2WAY  3CC 10FR (CATHETERS)
CATH FOLEY 2WAY 3CC 10FR (CATHETERS) IMPLANT
CATH FOLEY 2WAY SLVR  5CC 22FR (CATHETERS) ×2
CATH FOLEY 2WAY SLVR 5CC 22FR (CATHETERS) ×1 IMPLANT
CATH ROBINSON RED A/P 14FR (CATHETERS) IMPLANT
CLOTH BEACON ORANGE TIMEOUT ST (SAFETY) ×3 IMPLANT
ELECT REM PT RETURN 9FT ADLT (ELECTROSURGICAL)
ELECTRODE REM PT RTRN 9FT ADLT (ELECTROSURGICAL) IMPLANT
GLOVE BIO SURGEON STRL SZ7.5 (GLOVE) ×3 IMPLANT
GOWN STRL REUS W/ TWL LRG LVL3 (GOWN DISPOSABLE) ×3 IMPLANT
GOWN STRL REUS W/TWL LRG LVL3 (GOWN DISPOSABLE) ×6
GUIDEWIRE ANG ZIPWIRE 038X150 (WIRE) ×3 IMPLANT
GUIDEWIRE STR DUAL SENSOR (WIRE) IMPLANT
HOLDER FOLEY CATH W/STRAP (MISCELLANEOUS) ×3 IMPLANT
KIT ROOM TURNOVER WOR (KITS) ×3 IMPLANT
MANIFOLD NEPTUNE II (INSTRUMENTS) IMPLANT
NEEDLE HYPO 18GX1.5 BLUNT FILL (NEEDLE) IMPLANT
PACK CYSTO (CUSTOM PROCEDURE TRAY) ×3 IMPLANT
SYR 10ML LL (SYRINGE) ×3 IMPLANT
SYR 20CC LL (SYRINGE) IMPLANT
TUBE CONNECTING 12'X1/4 (SUCTIONS)
TUBE CONNECTING 12X1/4 (SUCTIONS) IMPLANT
WATER STERILE IRR 3000ML UROMA (IV SOLUTION) ×3 IMPLANT

## 2016-08-01 NOTE — Brief Op Note (Signed)
08/01/2016  2:07 PM  PATIENT:  Jacob Fields  76 y.o. male  PRE-OPERATIVE DIAGNOSIS:  URETHRAL STRICTURE  POST-OPERATIVE DIAGNOSIS:  URETHRAL STRICTURE  PROCEDURE:  Procedure(s): CYSTOSCOPY WITH URETHRAL BALLOON DILATATION:RETROGRADE URETHERGRAM (N/A)  SURGEON:  Surgeon(s) and Role:    * Alexis Frock, MD - Primary  PHYSICIAN ASSISTANT:   ASSISTANTS: none   ANESTHESIA:   local and MAC  EBL:  Total I/O In: 300 [I.V.:300] Out: -   BLOOD ADMINISTERED:none  DRAINS: 59F council cathetter to gravity drainage   LOCAL MEDICATIONS USED:  LIDOCAINE   SPECIMEN:  No Specimen  DISPOSITION OF SPECIMEN:  N/A  COUNTS:  YES  TOURNIQUET:  * No tourniquets in log *  DICTATION: .Other Dictation: Dictation Number [id number]  PLAN OF CARE: Discharge to home after PACU  PATIENT DISPOSITION:  PACU - hemodynamically stable.   Delay start of Pharmacological VTE agent (>24hrs) due to surgical blood loss or risk of bleeding: yes

## 2016-08-01 NOTE — H&P (Signed)
Jacob Fields is an 76 y.o. male.    Chief Complaint: Pre-op Urethral Dilation  HPI:   1 - Urethral Stricture - uses meatal dialtor PRN x years for meatal stenosis. PVR's <135mL 2018. Office cysto 06/2016 with pan-urethral stricture, not amenable to office dilation. He does have preseved space of Retzius by CT 07/2016 (best seen on sagital view)  2 - Nephrolithiasis - large volume right sided nephrolithiasis by CT 2014, repeat CT 07/2016 with large volume right stone in nearly completely atrophic kidney, no hydro. No contralateral stones.   3 - Recurrent UTI - many episodes bacterial cystitis as well as urosepsis. No renal imaging in years. PVR <197mL.  06/2016 - ER UCX - sens keflex / geng / nitro, Res amp / cipro / bactrim  4 - End Stage Renal Disease - on hemodialysis MWF x years for medical renal disease via left arm AVF. Voids few ounces every other day. Normally managed in [place], but now by [employer] in [place] given social situation.   5 - Hypospadias - Glanular hypospadias with dorsal hooded prepuce on exam.   PMH sig Ulcerative Colitis / solostomy, AFib/CVA/brillinta, failure to thrive (Used to live in [place], but now with family in [place] as not safe to live alone). He is retired Engineer, drilling.   Today "[name] " is seen to proceed with operative urethral dilation to hopefully lessen infection freqeuncy and prevent frank retention.   Past Medical History:  Diagnosis Date  . Anemia associated with chronic renal failure   . Aortic stenosis    per last echo 11/ 2017  mild to moderate AV stenosis w/ valve area 1.96cm^2  . AVF (arteriovenous fistula) (HCC)   . Bilateral leg weakness    uses cane/ walker  . Chronic systolic CHF (congestive heart failure) (HCC)    12/ 2017  in setting NSTEMI  . Coronary artery disease cardiologist-  dr Jacinto Halim   severe 3 CAD w/ occlusion per cardiac cath done in [place], [place]  :  hx stenting x2  . ESRD on hemodialysis (HCC)    monday ,  wednesday, and friday---  [facility]  . First degree heart block   . Gout    stable  . History of CVA (cerebrovascular accident)    previous hemorrhagic CVA in 2011 (per cardiologist documentation) 09/ 2014  s/p  right CEA (per head CT -- small chronic infarcts right parietal lobe, lacunar in bilateral basal ganglia and left thalamus  . History of hypertension    no issue since started hemodialysis 2016  due to hypotension  . History of non-ST elevation myocardial infarction (NSTEMI)    11/ 2017 in setting septic shock--- NSTEMI vs Demand ischemia per discharge note  . History of recurrent UTIs   . History of seizure    2010 (approx.)  x1 seizure--- per daughter unknown cause but was not put on medication nor has he had a seizure since  . History of septic shock    11/ 2017  urosepsis  . History of TIAs    per head CT  previous TIA's but per daughter pt  unaware  . Ileostomy present (HCC)    since 1970's  for ulcerative colitis  . PAF (paroxysmal atrial fibrillation) (HCC)    first dx episode 11/ 2017 in setting septic shock  . Peripheral vascular disease (HCC)   . RBBB (right bundle branch block)   . S/P coronary artery stent placement   . SunDown syndrome   .  Ulcerative colitis (HCC)    s/p  total colectomy 1970's  . Urethral stricture   . Vascular dementia without behavioral disturbance    neurologist-  athar  . Wears dentures   . Wears glasses     Past Surgical History:  Procedure Laterality Date  . AV FISTULA PLACEMENT  2001  approx.   w/ several revisions  . CARDIAC CATHETERIZATION  10/04/2015   in [place]   severe 3 vessel coronary disease including chronic total occlusion RCA, pLAD, critical stenosis in the diagonal , severe stenosis in the mLAD  . CARDIOVASCULAR STRESS TEST  09-18-2015   [facility]   abnormal myocardial perfustion study w/ lateral scar and ischemia;  Global LV systolic fucntion in abnormal w/ regional wall motion  abnormalities,  ef 51%  . CAROTID ENDARTERECTOMY Right 04/2013  . CATARACT EXTRACTION W/ INTRAOCULAR LENS  IMPLANT, BILATERAL    . CHOLECYSTECTOMY    . CORONARY ANGIOPLASTY WITH STENT PLACEMENT  2001  and 2002   mCFX;  OM1 and OM2  . TONSILLECTOMY  child  . TOTAL COLECTOMY  1970's   w/  Ileostomy creation (for ulcertive colitis)  . TOTAL KNEE ARTHROPLASTY  2015   unilateral  . TRANSTHORACIC ECHOCARDIOGRAM  05/12/2016   mild focal basal LVH of the septum,  ef 40%,  hypokinesis fo the vasal-midinferolateral and inferior myocardium,  grade 2 diastolic dysfunction/  mild to moderate AV stenosis (valve area 1.92cm^2), peak gradient 75mmHg)/  mild dilated ascending aorta/  trivial MR and TR/  mild LAE/  mild reduced RVSF/       Family History  Problem Relation Age of Onset  . Stroke Mother   . Stroke Brother   . CAD Brother    Social History:  reports that he has never smoked. He has never used smokeless tobacco. He reports that he does not drink alcohol or use drugs.  Allergies:  Allergies  Allergen Reactions  . Plavix [Clopidogrel] Other (See Comments)    " skin sores"  Daughter states had hives    No prescriptions prior to admission.    No results found for this or any previous visit (from the past 48 hour(s)). No results found.  Review of Systems  Constitutional: Positive for malaise/fatigue. Negative for chills and fever.  HENT: Negative.   Eyes: Negative.   Respiratory: Negative.   Cardiovascular: Negative.   Gastrointestinal: Negative.   Genitourinary: Positive for frequency and urgency. Negative for flank pain.  Musculoskeletal: Negative.   Skin: Negative.   Neurological: Negative.   Endo/Heme/Allergies: Negative.   Psychiatric/Behavioral: Negative.     There were no vitals taken for this visit. Physical Exam  Constitutional:  Stigmata of chronic disease, at baseline.   HENT:  Head: Normocephalic.  Eyes: Pupils are equal, round, and reactive to light.   Neck: Normal range of motion.  Cardiovascular: Normal rate.   Respiratory: Effort normal.  GI:  Multiple scars. End colostomy patent.   Genitourinary:  Genitourinary Comments: NO CVAT  Musculoskeletal: Normal range of motion.  Neurological: He is alert.  Skin: Skin is warm.  Psychiatric: He has a normal mood and affect.     Assessment/Plan  Proceed as planned with urethral dilation under anesthesia. Should his stricture disease not be amenable, SPT would be next option. Should relief of outlet obstruction nor break cycle of severe infections, he may ultimately benefit from Rt retroperitoneal nephrecotmy, but I doubt his functional status would allow that.   Risks, benefits, alternatives (palliative only management),  expected peri-op course with DC with catheter discussed previously and reiterated today.    Sebastian AcheMANNY, Vivian Okelley, MD 08/01/2016, 5:59 AM

## 2016-08-01 NOTE — Anesthesia Procedure Notes (Signed)
Procedure Name: MAC Date/Time: 08/01/2016 1:35 PM Performed by: Roderic Palau Pre-anesthesia Checklist: Patient identified, Emergency Drugs available, Suction available, Patient being monitored and Timeout performed Oxygen Delivery Method: Simple face mask Placement Confirmation: positive ETCO2,  CO2 detector and breath sounds checked- equal and bilateral

## 2016-08-01 NOTE — Anesthesia Preprocedure Evaluation (Addendum)
Anesthesia Evaluation  Patient identified by MRN, date of birth, ID band Patient awake    Reviewed: Allergy & Precautions, NPO status , Patient's Chart, lab work & pertinent test results  History of Anesthesia Complications Negative for: history of anesthetic complications  Airway Mallampati: II  TM Distance: >3 FB Neck ROM: Full    Dental no notable dental hx. (+) Dental Advisory Given, Partial Upper, Partial Lower   Pulmonary neg pulmonary ROS,    Pulmonary exam normal        Cardiovascular + CAD, + Past MI, + Peripheral Vascular Disease and +CHF  Normal cardiovascular exam+ Valvular Problems/Murmurs AS   Impressions:  - Mild basal septal LV hypertrophy with LVEF approximately 40%.   There is hypokinesis of the mid to basal inferolateral and   inferior walls. Grade 2 diastolic dysfunction. Mild left atrial   enlargement. Calcified mitral annulus with trivial mitral   regurgitation. Mildly dilated ascending aorta. Mild to moderate   calcific aortic stenosis as outlined above. Mildly reduced right   ventricular contraction. Trivial tricuspid regurgitation.   Neuro/Psych Dementia CVA negative psych ROS   GI/Hepatic Neg liver ROS, PUD,   Endo/Other  negative endocrine ROS  Renal/GU ESRFRenal disease  negative genitourinary   Musculoskeletal negative musculoskeletal ROS (+)   Abdominal   Peds negative pediatric ROS (+)  Hematology negative hematology ROS (+)   Anesthesia Other Findings   Reproductive/Obstetrics negative OB ROS                            Anesthesia Physical Anesthesia Plan  ASA: III  Anesthesia Plan: MAC   Post-op Pain Management:    Induction:   Airway Management Planned: Natural Airway and Simple Face Mask  Additional Equipment:   Intra-op Plan:   Post-operative Plan:   Informed Consent: I have reviewed the patients History and Physical, chart, labs and  discussed the procedure including the risks, benefits and alternatives for the proposed anesthesia with the patient or authorized representative who has indicated his/her understanding and acceptance.   Dental advisory given  Plan Discussed with: CRNA and Anesthesiologist  Anesthesia Plan Comments:         Anesthesia Quick Evaluation

## 2016-08-01 NOTE — Anesthesia Postprocedure Evaluation (Signed)
Anesthesia Post Note  Patient: Jacob Fields  Procedure(s) Performed: Procedure(s) (LRB): CYSTOSCOPY WITH URETHRAL BALLOON DILATATION:RETROGRADE URETHERGRAM (N/A)  Patient location during evaluation: PACU Anesthesia Type: MAC Level of consciousness: awake and alert Pain management: pain level controlled Vital Signs Assessment: post-procedure vital signs reviewed and stable Respiratory status: spontaneous breathing, nonlabored ventilation and respiratory function stable Cardiovascular status: stable and blood pressure returned to baseline Anesthetic complications: no       Last Vitals:  Vitals:   08/01/16 1445 08/01/16 1500  BP: (!) 120/54 (!) 131/57  Pulse: 62 60  Resp: 10 13  Temp:      Last Pain:  Vitals:   08/01/16 1445  TempSrc:   PainSc: 3                  Oma Alpert,W. EDMOND

## 2016-08-01 NOTE — Discharge Instructions (Signed)
1 - You may have urinary urgency (bladder spasms) and bloody urine on / off with catheter in place. This is normal.  2 - Resume blood thinners on Monday morning as long as urine is visibly clear.   3 - Call MD or go to ER for fever >102, severe pain / nausea / vomiting not relieved by medications, or acute change in medical status  Foley Catheter Care, Adult A Foley catheter is a soft, flexible tube that is placed into the bladder to drain urine. A Foley catheter may be inserted if:  You leak urine or are not able to control when you urinate (urinary incontinence).  You are not able to urinate when you need to (urinary retention).  You had prostate surgery or surgery on the genitals.  You have certain medical conditions, such as multiple sclerosis, dementia, or a spinal cord injury. If you are going home with a Foley catheter in place, follow the instructions below. TAKING CARE OF THE CATHETER 1. Wash your hands with soap and water. 2. Using mild soap and warm water on a clean washcloth:  Clean the area on your body closest to the catheter insertion site using a circular motion, moving away from the catheter. Never wipe toward the catheter because this could sweep bacteria up into the urethra and cause infection.  Remove all traces of soap. Pat the area dry with a clean towel. For males, reposition the foreskin. 3. Attach the catheter to your leg so there is no tension on the catheter. Use adhesive tape or a leg strap. If you are using adhesive tape, remove any sticky residue left behind by the previous tape you used. 4. Keep the drainage bag below the level of the bladder, but keep it off the floor. 5. Check throughout the day to be sure the catheter is working and urine is draining freely. Make sure the tubing does not become kinked. 6. Do not pull on the catheter or try to remove it. Pulling could damage internal tissues. TAKING CARE OF THE DRAINAGE BAGS You will be given two drainage  bags to take home. One is a large overnight drainage bag, and the other is a smaller leg bag that fits underneath clothing. You may wear the overnight bag at any time, but you should never wear the smaller leg bag at night. Follow the instructions below for how to empty, change, and clean your drainage bags. Emptying the Drainage Bag  You must empty your drainage bag when it is ?- full or at least 2-3 times a day. 1. Wash your hands with soap and water. 2. Keep the drainage bag below your hips, below the level of your bladder. This stops urine from going back into the tubing and into your bladder. 3. Hold the dirty bag over the toilet or a clean container. 4. Open the pour spout at the bottom of the bag and empty the urine into the toilet or container. Do not let the pour spout touch the toilet, container, or any other surface. Doing so can place bacteria on the bag, which can cause an infection. 5. Clean the pour spout with a gauze pad or cotton ball that has rubbing alcohol on it. 6. Close the pour spout. 7. Attach the bag to your leg with adhesive tape or a leg strap. 8. Wash your hands well. Changing the Drainage Bag  Change your drainage bag once a month or sooner if it starts to smell bad or look dirty. Below are  steps to follow when changing the drainage bag. 1. Wash your hands with soap and water. 2. Pinch off the rubber catheter so that urine does not spill out. 3. Disconnect the catheter tube from the drainage tube at the connection valve. Do not let the tubes touch any surface. 4. Clean the end of the catheter tube with an alcohol wipe. Use a different alcohol wipe to clean the end of the drainage tube. 5. Connect the catheter tube to the drainage tube of the clean drainage bag. 6. Attach the new bag to the leg with adhesive tape or a leg strap. Avoid attaching the new bag too tightly. 7. Wash your hands well. Cleaning the Drainage Bag  1. Wash your hands with soap and  water. 2. Wash the bag in warm, soapy water. 3. Rinse the bag thoroughly with warm water. 4. Fill the bag with a solution of white vinegar and water (1 cup vinegar to 1 qt warm water [.2 L vinegar to 1 L warm water]). Close the bag and soak it for 30 minutes in the solution. 5. Rinse the bag with warm water. 6. Hang the bag to dry with the pour spout open and hanging downward. 7. Store the clean bag (once it is dry) in a clean plastic bag. 8. Wash your hands well. PREVENTING INFECTION  Wash your hands before and after handling your catheter.  Take showers daily and wash the area where the catheter enters your body. Do not take baths. Replace wet leg straps with dry ones, if this applies.  Do not use powders, sprays, or lotions on the genital area. Only use creams, lotions, or ointments as directed by your caregiver.  For females, wipe from front to back after each bowel movement.  Drink enough fluids to keep your urine clear or pale yellow unless you have a fluid restriction.  Do not let the drainage bag or tubing touch or lie on the floor.  Wear cotton underwear to absorb moisture and to keep your skin drier. SEEK MEDICAL CARE IF:   Your urine is cloudy or smells unusually bad.  Your catheter becomes clogged.  You are not draining urine into the bag or your bladder feels full.  Your catheter starts to leak. SEEK IMMEDIATE MEDICAL CARE IF:   You have pain, swelling, redness, or pus where the catheter enters the body.  You have pain in the abdomen, legs, lower back, or bladder.  You have a fever.  You see blood fill the catheter, or your urine is pink or red.  You have nausea, vomiting, or chills.  Your catheter gets pulled out. MAKE SURE YOU:   Understand these instructions.  Will watch your condition.  Will get help right away if you are not doing well or get worse. This information is not intended to replace advice given to you by your health care provider. Make  sure you discuss any questions you have with your health care provider. Document Released: 06/03/2005 Document Revised: 10/18/2013 Document Reviewed: 05/20/2015 Elsevier Interactive Patient Education  2017 [employer]  Post Anesthesia Home Care Instructions  Activity: Get plenty of rest for the remainder of the day. A responsible adult should stay with you for 24 hours following the procedure.  For the next 24 hours, DO NOT: -Drive a car -Advertising copywriter -Drink alcoholic beverages -Take any medication unless instructed by your physician -Make any legal decisions or sign important papers.  Meals: Start with liquid foods such as gelatin or soup. Progress  to regular foods as tolerated. Avoid greasy, spicy, heavy foods. If nausea and/or vomiting occur, drink only clear liquids until the nausea and/or vomiting subsides. Call your physician if vomiting continues.  Special Instructions/Symptoms: Your throat may feel dry or sore from the anesthesia or the breathing tube placed in your throat during surgery. If this causes discomfort, gargle with warm salt water. The discomfort should disappear within 24 hours.  If you had a scopolamine patch placed behind your ear for the management of post- operative nausea and/or vomiting:  1. The medication in the patch is effective for 72 hours, after which it should be removed.  Wrap patch in a tissue and discard in the trash. Wash hands thoroughly with soap and water. 2. You may remove the patch earlier than 72 hours if you experience unpleasant side effects which may include dry mouth, dizziness or visual disturbances. 3. Avoid touching the patch. Wash your hands with soap and water after contact with the patch.

## 2016-08-01 NOTE — Transfer of Care (Signed)
Last Vitals:  Vitals:   08/01/16 1233  BP: (!) 111/59  Pulse: 74  Resp: 16  Temp: 36.4 C    Last Pain:  Vitals:   08/01/16 1233  TempSrc: Oral         Immediate Anesthesia Transfer of Care Note  Patient: Jacob Fields  Procedure(s) Performed: Procedure(s) (LRB): CYSTOSCOPY WITH URETHRAL BALLOON DILATATION:RETROGRADE URETHERGRAM (N/A)  Patient Location: PACU  Anesthesia Type: MAC  Level of Consciousness: awake, alert  and oriented  Airway & Oxygen Therapy: Patient Spontanous Breathing and Patient connected to face mask oxygen  Post-op Assessment: Report given to PACU RN and Post -op Vital signs reviewed and stable  Post vital signs: Reviewed and stable  Complications: No apparent anesthesia complications

## 2016-08-02 ENCOUNTER — Encounter (HOSPITAL_BASED_OUTPATIENT_CLINIC_OR_DEPARTMENT_OTHER): Payer: Self-pay | Admitting: Urology

## 2016-08-02 NOTE — Op Note (Signed)
NAME:  Jacob Fields, Jacob Fields                 ACCOUNT NO.:  [id number]  MEDICAL RECORD NO.:  [id number]  LOCATION:                                FACILITY:  [facility]  PHYSICIAN:  Sebastian Acheheodore Chukwuebuka Churchill, MD     DATE OF BIRTH:  August 02, 1940  DATE OF PROCEDURE:  08/01/2016                              OPERATIVE REPORT   PREOPERATIVE DIAGNOSES:  Urethral stricture with urinary retention, recurrent infections, end-stage renal disease with pyocystis.  PROCEDURE: 1. Cystoscopy with retrograde urethrogram. 2. Urethral dilation. 3. Catheter placement, complicated.  ESTIMATED BLOOD LOSS:  Nil.  COMPLICATION:  None.  SPECIMEN:  None.  DRAINS:  One 22-French Council catheter to gravity irrigation.  FINDINGS: 1. Panurethral stricture.  Estimated 8-French pre dilation, 30-French     post dilation. 2. Urinary bladder with some pyocystis changes as expected with end-     stage renal disease.  INDICATION:  Mr. Jacob Fields is a 76 year old gentleman, who was quite comorbid from cerebrovascular disease as well as cardiovascular disease, history of chronic urethral stricture.  [place].  Given his functional status decline, he has now moved in with his son for further care locally.  He was found to have recurrent urinary retention as per his prior per report.  He was evaluated in the office and he was found to have panurethral stricture disease and he has a blood thinner use as well, so that the most prudent means of management will be operative dilation with a window of holding his blood thinners.  He wished to proceed.  Informed consent was obtained and placed in medical record.  PROCEDURE IN DETAIL:  The patient being Jacob Fields, was verified.  Procedure being ureteral dilation was confirmed.  Procedure was carried out.  Time-out was performed.  Intravenous antibiotics were administered.  Conscious sedation was delivered.  Sterile field was created by  prepping and draping the patient's penis, perineum, proximal thighs using iodine.  A 10 mL of viscous lidocaine was instilled per urethra.  Next, using the 20-French NephroMax balloon dilation apparatus as an open-ended catheter, the urethra was cannulated.  It was very, very narrow caliber estimated approximately 8-French, retrograde urethrogram was obtained.  Retrograde urethrogram revealed a very narrow caliber regular pendulous urethra, revealed to be normal caliber prostatic urethra.  A 0.038 Zip wire was advanced to the level of the urinary bladder as verified. Fluoroscopically, the 20-French balloon dilation apparatus carefully advanced across the level of stricture.  This inflated to a pressure of 20 atmospheres, held for 90 seconds, then released.  Next, flexible cystoscopy was performed using a 16-French flexible cystoscope.  This revealed excellent resolution of the highest grade areas of stricture in the pendulous urethra.  As expected, the membranous urethra, the prostatic urethra were unremarkable.  There were some pyocystis changes in the urinary bladder.  This informed purulent material visualized settling and some urine.  Keeping the zip wire as a working wire, a rigid dilator was then performed sequentially from 20- [place] to 30-French in 2-French increments.  Further dilating the pendulous stricture to 30-French and a 22-French Council catheter was then placed per  urethra to straight drain 10 mL or in the balloon.  This was irrigated and irrigated quantitatively.  Hemostasis appeared excellent.  The procedure was terminated.  The patient tolerated procedure well.  There were no immediate periprocedural complications. The patient was taken to the postanesthesia care unit in stable condition.          ______________________________ Sebastian Ache, MD     TM/MEDQ  D:  08/01/2016  T:  08/02/2016  Job:  [id number]

## 2016-08-02 NOTE — Op Note (Deleted)
  The note originally documented on this encounter has been moved the the encounter in which it belongs.  

## 2016-08-03 ENCOUNTER — Ambulatory Visit
Admission: RE | Admit: 2016-08-03 | Discharge: 2016-08-03 | Disposition: A | Discharge status: Home / Self Care | Payer: Medicare Other | Source: Ambulatory Visit | Attending: Neurology | Admitting: Neurology

## 2016-08-03 DIAGNOSIS — F0150 Vascular dementia without behavioral disturbance: Secondary | ICD-10-CM

## 2016-08-03 DIAGNOSIS — Z8619 Personal history of other infectious and parasitic diseases: Secondary | ICD-10-CM

## 2016-08-03 DIAGNOSIS — Z8673 Personal history of transient ischemic attack (TIA), and cerebral infarction without residual deficits: Secondary | ICD-10-CM

## 2016-08-03 DIAGNOSIS — I480 Paroxysmal atrial fibrillation: Secondary | ICD-10-CM

## 2016-08-04 ENCOUNTER — Telehealth: Payer: Self-pay | Admitting: Neurology

## 2016-08-04 NOTE — Telephone Encounter (Signed)
I was supposed to enter this as a result note for his brain MRI, but accidentally hit reviewed and the result "disappeared".  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 He has a complex medical Hx and multiple vasc risk factors.  No acute changes were seen such as stroke or bleeding.  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  No immediate action is required, just reinforcing good nutrition, good hydration, medical management of his other chronic conditions.  I am somewhat reluctant to start a dementia medication, such as aricept due to his urinary retention and recurrent UTI Hx. He had a procedure recently on 08/01/16 with urethral balloon dilatation.  Maybe in the near future, we can try low dose namenda.  Please make FU appt in 3 months.

## 2016-08-05 NOTE — Telephone Encounter (Signed)
I spoke to pt's daughter, Dorothyann GibbsMarti, per [place]. I advised her of MRI results. Pt's daughter said, "Does Dr. Frances FurbishAthar think this is vascular dementia?" I advised her that per Dr. Teofilo PodAthar's last visit note, it mentions that pt does have risk factors for vascular dementia and this MRI showed advanced vascular changes, but that Dr. Frances FurbishAthar will discuss this further with pt at the 3 month follow up. Pt's daughter reports that the pt is moving to an ALF. A 3 month follow up was made for 10/17/2016 at 10:30am. Pt's daughter verbalized understanding of results. Pt's daughter had no questions at this time but was encouraged to call back if questions arise.

## 2016-10-17 ENCOUNTER — Encounter (INDEPENDENT_AMBULATORY_CARE_PROVIDER_SITE_OTHER): Payer: Self-pay

## 2016-10-17 ENCOUNTER — Ambulatory Visit (INDEPENDENT_AMBULATORY_CARE_PROVIDER_SITE_OTHER): Payer: Medicare Other | Admitting: Neurology

## 2016-10-17 ENCOUNTER — Encounter: Payer: Self-pay | Admitting: Neurology

## 2016-10-17 VITALS — BP 132/68 | HR 76 | Resp 14 | Ht 66.0 in | Wt 135.0 lb

## 2016-10-17 DIAGNOSIS — F0150 Vascular dementia without behavioral disturbance: Secondary | ICD-10-CM

## 2016-10-17 MED ORDER — DONEPEZIL HCL 5 MG PO TABS: 5.0000 mg | ORAL_TABLET | Freq: Every day | ORAL | 5 refills | Status: DC

## 2016-10-17 NOTE — Patient Instructions (Signed)
We will start you on low dose memory medication: Aricept (generic name: donepezil) 5 mg: take one pill each evening. Common side effects may include dry eyes, dry mouth, confusion, low pulse, low blood pressure, also GI related side effects (nausea, vomiting, diarrhea, constipation), headaches; rare side effects may include hallucinations and seizures.   We will do a follow up in 3 months, you can see one of our nurse practitioners. I will see you after that.

## 2016-10-17 NOTE — Progress Notes (Signed)
Subjective:    Patient ID: Jacob Fields is a 76 y.o. male.  HPI     Interim history:   Mr. Jacob Fields is a 76 year old right-handed gentleman with an underlying complex medical history of end-stage renal disease, on hemodialysis, recurrent UTIs, kidney stones, TIA, CVA, right carotid endarterectomy, seizure disorder, heart disease, status post stent placement, ulcerative colitis, gout, A. fib, and recent hospitalization secondary to altered mental status, secondary to sepsis in the context of UTI, who presents for follow-up consultation of his memory loss. The patient is accompanied by Jacob Fields, his "granddaughter in law, today. I first met him on 07/25/2016 at the request of his nephrologist, at which time he was reported to have had memory loss for years, significantly worse after a recent hospitalization and ICU stay at the end of 2017. His MMSE was 21 out of 30 at the time, clock drawing 2 out of 4, animal fluency 8/m. I suggested proceeding with brain MRI. They were in the process of transitioning to assisted living. He had a brain MRI without contrast on 08/03/2016 which I reviewed: IMPRESSION: 1.  No acute intracranial abnormality. 2. Advanced chronic ischemic and small vessel disease. 3. 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   Today, 10/17/2016 (all dictated new, as well as above notes, some dictation done in note pad or Word, outside of chart, may appear as copied):  He reports doing about the same. He is no longer on the Elavil 25 mg qHS. No recent falls. Is in Assisted Living at [facility], walks outside some, appetite good. Some confusion regarding where to put things and day of the week, per text message from Jacob Fields, his daughter, to [place] (Jacob Fields's daughter in [place]). He feels he has adjusted well to [facility]  Previously (copied from previous notes for reference):    07/25/2016: He has had memory loss for the past years, drastically worse after and during his recent prolonged hospitalization and ICU stay.  I reviewed your office records from 07/12/2016. Also reviewed his discharge summary and hospital records from his November hospital admission, he was admitted on 05/11/2016 and discharged on 05/23/2016. He was diagnosed with new onset A. fib and suspected to have a non-STEMI. He had a head CT without contrast on 06/12/2013 which I reviewed: IMPRESSION: 1. No acute intracranial pathology seen on CT. 2. Mild to moderate cortical volume loss and scattered small vessel ischemic microangiopathy. 3. Small chronic infarct at the high right parietal lobe, with mild associated encephalomalacia; small chronic lacunar infarcts within the basal ganglia bilaterally, and at the left thalamus.   He had a recent memory assessment through his occupational therapist, he scored 18 out of 30 on the Jefferson scale on 06/27/2016.   Hx of Sz in the past. Not on med for it now, may have been on Dilantin.  He is a retired Software engineer, no overt FHx of dementia. Non-smoker, no alcohol, never heavy drinker, no daily caffeine.  Still on amitriptyline, was supposed to stop it. He used to be on 3 pills at night, I'm assuming that this was for sleep. Per daughter, who provides all of the Hx, he used to live alone, lived about 2 hours away from here and visited for Thanksgiving last year, but got sick. After his hospital stay he was in inpatient rehabilitation at [facility] for about a month and was discharged to home on 06/15/2016 and has been staying with  his daughter, Jacob Fields and her husband since then. They are looking into assisted living facilities and so far liked [facility]. He has been divorced for years. He has 2 sons, one in [place], [place] and the other in [place]. All children have healthcare power of attorney. He has hemodialysis on Monday, Wednesday and Fridays, he has  been on dialysis for about 2 years. He had a dialysis fistula for the past several years, maybe since 2001. He had multiple surgeries including lithotripsy, knee replacement surgery, cholecystectomy, fistula placements, colectomy, right carotid endarterectomy. Denies any significant depression or anxiety but had evidence of delirium during his hospitalization per daughter.   His Past Medical History Is Significant For: Past Medical History:  Diagnosis Date  . Anemia associated with chronic renal failure   . Aortic stenosis    per last echo 11/ 2017  mild to moderate AV stenosis w/ valve area 1.96cm^2  . AVF (arteriovenous fistula) ([place])   . Bilateral leg weakness    uses cane/ walker  . Chronic systolic CHF (congestive heart failure) ([place])    12/ 2017  in setting NSTEMI  . Coronary artery disease cardiologist-  dr Einar Gip   severe 3 CAD w/ occlusion per cardiac cath done in [place], [place]  :  hx stenting x2  . ESRD on hemodialysis ([place])    monday , wednesday, and friday---  [facility]  . First degree heart block   . Gout    stable  . History of CVA (cerebrovascular accident)    previous hemorrhagic CVA in 2011 (per cardiologist documentation) 09/ 2014  s/p  right CEA (per head CT -- small chronic infarcts right parietal lobe, lacunar in bilateral basal ganglia and left thalamus  . History of hypertension    no issue since started hemodialysis 2016  due to hypotension  . History of non-ST elevation myocardial infarction (NSTEMI)    11/ 2017 in setting septic shock--- NSTEMI vs [place] ischemia per discharge note  . History of recurrent UTIs   . History of seizure    2010 (approx.)  x1 seizure--- per daughter unknown cause but was not put on medication nor has he had a seizure since  . History of septic shock    11/ 2017  urosepsis  . History of TIAs    per head CT  previous TIA's but per daughter pt  unaware  . Ileostomy present ([place])    since 1970's  for ulcerative  colitis  . PAF (paroxysmal atrial fibrillation) ([place])    first dx episode 11/ 2017 in setting septic shock  . Peripheral vascular disease ([place])   . RBBB (right bundle branch block)   . S/P coronary artery stent placement   . SunDown syndrome   . Ulcerative colitis ([place])    s/p  total colectomy 1970's  . Urethral stricture   . Vascular dementia without behavioral disturbance    neurologist-  Moe Brier  . Wears dentures   . Wears glasses     His Past Surgical History Is Significant For: Past Surgical History:  Procedure Laterality Date  . AV FISTULA PLACEMENT  2001  approx.   w/ several revisions  . CARDIAC CATHETERIZATION  10/04/2015   in [place]   severe 3 vessel coronary disease including chronic total occlusion RCA, pLAD, critical stenosis in the diagonal , severe stenosis in the mLAD  . CARDIOVASCULAR STRESS TEST  09-18-2015   [facility] [facility]   abnormal myocardial perfustion study w/ lateral scar and  ischemia;  Global LV systolic fucntion in abnormal w/ regional wall motion abnormalities,  ef 51%  . CAROTID ENDARTERECTOMY Right 04/2013  . CATARACT EXTRACTION W/ INTRAOCULAR LENS  IMPLANT, BILATERAL    . CHOLECYSTECTOMY    . CORONARY ANGIOPLASTY WITH STENT PLACEMENT  2001  and 2002   mCFX;  OM1 and OM2  . CYSTOSCOPY WITH URETHRAL DILATATION N/A 08/01/2016   Procedure: CYSTOSCOPY WITH URETHRAL BALLOON DILATATION:RETROGRADE Maebelle Munroe;  Surgeon: Alexis Frock, MD;  Location: [facility];  Service: Urology;  Laterality: N/A;  . TONSILLECTOMY  child  . TOTAL COLECTOMY  1970's   w/  Ileostomy creation (for ulcertive colitis)  . TOTAL KNEE ARTHROPLASTY  2015   unilateral  . TRANSTHORACIC ECHOCARDIOGRAM  05/12/2016   mild focal basal LVH of the septum,  ef 40%,  hypokinesis fo the vasal-midinferolateral and inferior myocardium,  grade 2 diastolic dysfunction/  mild to moderate AV stenosis (valve area 1.92cm^2), peak gradient 11mHg)/  mild dilated ascending  aorta/  trivial MR and TR/  mild LAE/  mild reduced RVSF/       His Family History Is Significant For: Family History  Problem Relation Age of Onset  . Stroke Mother   . Stroke Brother   . CAD Brother     His Social History Is Significant For: Social History   Social History  . Marital status: Divorced    Spouse name: N/A  . Number of children: N/A  . Years of education: N/A   Social History Main Topics  . Smoking status: Never Smoker  . Smokeless tobacco: Never Used  . Alcohol use No  . Drug use: No  . Sexual activity: Not Asked   Other Topics Concern  . None   Social History Narrative  . None    His Allergies Are:  Allergies  Allergen Reactions  . Plavix [Clopidogrel] Other (See Comments)    " skin sores"  Daughter states had hives  :   His Current Medications Are:  Outpatient Encounter Prescriptions as of 10/17/2016  Medication Sig  . aspirin 81 MG chewable tablet Chew 1 tablet (81 mg total) by mouth daily.  .Marland Kitchenatorvastatin (LIPITOR) 40 MG tablet Take 40 mg by mouth daily.  . B Complex-C-Folic Acid (RENA-VITE PO) Take 1 tablet by mouth daily.  . calcium acetate (PHOSLO) 667 MG capsule Take 667 mg by mouth 3 (three) times daily with meals.   . calcium carbonate (TUMS - DOSED IN MG ELEMENTAL CALCIUM) 500 MG chewable tablet Chew 1 tablet by mouth as needed for indigestion or heartburn.  . febuxostat (ULORIC) 40 MG tablet Take 20 mg by mouth every morning.  .Marland KitchenguaiFENesin (MUCINEX) 600 MG 12 hr tablet Take by mouth 2 (two) times daily.  . midodrine (PROAMATINE) 2.5 MG tablet Take 1 tablet (2.5 mg total) by mouth 3 (three) times daily with meals.  . ticagrelor (BRILINTA) 90 MG TABS tablet Take by mouth 2 (two) times daily.  . [DISCONTINUED] atorvastatin (LIPITOR) 80 MG tablet Take 80 mg by mouth every evening.   . [DISCONTINUED] colchicine 0.6 MG tablet Take 1 tablet by mouth daily as needed.   . [DISCONTINUED] fluocinonide cream (LIDEX) 01.61% Apply 1 application  topically daily as needed.  . [DISCONTINUED] loratadine (CLARITIN) 10 MG tablet Take 10 mg by mouth daily as needed.   . [DISCONTINUED] traMADol (ULTRAM) 50 MG tablet Take 1 tablet (50 mg total) by mouth every 6 (six) hours as needed for moderate pain or severe pain.  Post-operatively   No facility-administered encounter medications on file as of 10/17/2016.   :  Review of Systems:  Out of a complete 14 point review of systems, all are reviewed and negative with the exception of these symptoms as listed below: Review of Systems  Neurological:       Patient is no longer taking Amitriptyline. No new concers per patient.     Objective:  Neurologic Exam  Physical Exam Physical Examination:   Vitals:   10/17/16 1050  BP: 132/68  Pulse: 76  Resp: 14   General Examination: The patient is a very pleasant 76 y.o. male in no acute distress. He appears well-developed and well-nourished and well groomed.   HEENT: Normocephalic, atraumatic, pupils are equal, round and reactive to light and accommodation. Extraocular trackingIs a little difficult for him, hearing mildly impaired. Face is symmetric, speech is mildly hypophonic, no dysarthria. Airway exam is unchanged, with mild to moderate dryness noted. He has an unremarkable right-sided carotid endarterectomy scar.  Chest: is clear to auscultation without wheezing, rhonchi or crackles noted.  Heart: sounds are fairly regular today, no clear-cut evidence of A. fib, pulse regular.   Abdomen: is soft, non-tender and non-distended with normal bowel sounds appreciated on auscultation.  Extremities: There is no pitting edema in the distal lower extremities bilaterally. Pedal pulses are intact.   Skin: is warm and dry with no trophic changes noted. Age-related changes are noted on the skin. Mild bruising on skin.  Musculoskeletal: exam reveals no obvious joint deformities. Dialysis fistula left upper arm.   Neurologically:  Mental status:  The patient is awake and alert, paying good  attention. He is unable to provide some of his history, his granddaughter and low provides some information as well and also reads a text message that she has from [place], patient's daughter.  On 07/25/2016: MMSE 21/30, CDT: 2/4, AFT: 8/min.    Cranial nerves are as described above under HEENT exam.  Motor exam:  Thin bulk, normal tone, global strength of 4 out of 5. Fine motor skills are globally mildly impaired. Reflexes are about 1+ throughout, sensory exam nonfocal in the upper and lower extremities. He stands with difficulty, posture is age-appropriate to mildly stooped. He did not bring a walking aid today. He is able to stand unsupported. He takes smaller steps, posture age-appropriate. Arm swing preserved. Balance is impaired. He turns cautiously. Romberg testing and tandem walk were not testable safely. Sensory exam is intact to light touch.   Assessment and Plan:   In summary, DARRIE MACMILLAN is a very pleasant 76 year old male with an underlying complex medical history of end-stage renal disease, on hemodialysis, recurrent UTIs, kidney stones, TIA, CVA, right carotid endarterectomy, seizure disorder, heart disease, status post stent placement, ulcerative colitis, gout, A. fib, and hospitalization secondary to altered mental status, secondary to sepsis in the context of UTI, who has had memory loss for the past years, drastically worse after and during his recent prolonged hospitalization and ICU stay. He has had an abnormal memory score, multiple vascular risk factors, likely vascular dementia without behavioral disturbance. An MRI on 08/03/2016 showed moderate vascular changes and atrophy. He also had evidence of an occluded left vertebral artery, with collateral flow. I explained the MRI brain findings to the patient and his granddaughter in law. At this juncture, I suggested we cautiously try low-dose Aricept generic, 5 mg strength once daily. I  provided instructions in her written prescription. We talked about potential side effects as  well. I suggested a three-month follow-up appointment of our nurse practitioners, at which time we can try to increase the Aricept to 10 mg once daily if tolerated. We will also repeat his memory scores at the time. I answered all their questions today and the patient and Jacob Fields were in agreement.  I spent 25 minutes in total face-to-face time with the patient, more than 50% of which was spent in counseling and coordination of care, reviewing test results, reviewing medication and discussing or reviewing the diagnosis of dementia, its prognosis and treatment options. Pertinent laboratory and imaging test results that were available during this visit with the patient were reviewed by me and considered in my medical decision making (see chart for details).

## 2016-11-27 ENCOUNTER — Other Ambulatory Visit: Payer: Self-pay | Admitting: Geriatric Medicine

## 2016-11-27 DIAGNOSIS — S0990XA Unspecified injury of head, initial encounter: Secondary | ICD-10-CM

## 2016-11-28 ENCOUNTER — Ambulatory Visit
Admission: RE | Admit: 2016-11-28 | Discharge: 2016-11-28 | Disposition: A | Discharge status: Home / Self Care | Payer: Medicare Other | Source: Ambulatory Visit | Attending: Geriatric Medicine | Admitting: Geriatric Medicine

## 2016-11-28 DIAGNOSIS — S0990XA Unspecified injury of head, initial encounter: Secondary | ICD-10-CM

## 2016-12-28 ENCOUNTER — Emergency Department (HOSPITAL_BASED_OUTPATIENT_CLINIC_OR_DEPARTMENT_OTHER)
Admission: EM | Admit: 2016-12-28 | Discharge: 2016-12-28 | Disposition: A | Discharge status: Home / Self Care | Payer: Medicare Other | Attending: Emergency Medicine | Admitting: Emergency Medicine

## 2016-12-28 ENCOUNTER — Encounter (HOSPITAL_BASED_OUTPATIENT_CLINIC_OR_DEPARTMENT_OTHER): Payer: Self-pay | Admitting: Emergency Medicine

## 2016-12-28 DIAGNOSIS — I2510 Atherosclerotic heart disease of native coronary artery without angina pectoris: Secondary | ICD-10-CM | POA: Insufficient documentation

## 2016-12-28 DIAGNOSIS — I132 Hypertensive heart and chronic kidney disease with heart failure and with stage 5 chronic kidney disease, or end stage renal disease: Secondary | ICD-10-CM | POA: Diagnosis not present

## 2016-12-28 DIAGNOSIS — Z955 Presence of coronary angioplasty implant and graft: Secondary | ICD-10-CM | POA: Diagnosis not present

## 2016-12-28 DIAGNOSIS — Z79899 Other long term (current) drug therapy: Secondary | ICD-10-CM | POA: Insufficient documentation

## 2016-12-28 DIAGNOSIS — I252 Old myocardial infarction: Secondary | ICD-10-CM | POA: Insufficient documentation

## 2016-12-28 DIAGNOSIS — N390 Urinary tract infection, site not specified: Secondary | ICD-10-CM | POA: Insufficient documentation

## 2016-12-28 DIAGNOSIS — Z7982 Long term (current) use of aspirin: Secondary | ICD-10-CM | POA: Diagnosis not present

## 2016-12-28 DIAGNOSIS — N186 End stage renal disease: Secondary | ICD-10-CM | POA: Insufficient documentation

## 2016-12-28 DIAGNOSIS — I5042 Chronic combined systolic (congestive) and diastolic (congestive) heart failure: Secondary | ICD-10-CM | POA: Diagnosis not present

## 2016-12-28 DIAGNOSIS — R339 Retention of urine, unspecified: Secondary | ICD-10-CM | POA: Diagnosis present

## 2016-12-28 LAB — URINALYSIS, MICROSCOPIC (REFLEX)

## 2016-12-28 LAB — URINALYSIS, ROUTINE W REFLEX MICROSCOPIC
Bilirubin Urine: NEGATIVE
GLUCOSE, UA: NEGATIVE mg/dL
KETONES UR: NEGATIVE mg/dL
NITRITE: NEGATIVE
Protein, ur: 100 mg/dL — AB
SPECIFIC GRAVITY, URINE: 1.01 (ref 1.005–1.030)
pH: 8.5 — ABNORMAL HIGH (ref 5.0–8.0)

## 2016-12-28 MED ORDER — CEPHALEXIN 500 MG PO CAPS: 500.0000 mg | ORAL_CAPSULE | Freq: Two times a day (BID) | ORAL | 0 refills | Status: AC

## 2016-12-28 NOTE — ED Provider Notes (Signed)
[facility] Provider Note   CSN: [id number] Arrival date & time: 12/28/16  1118     History   Chief Complaint Chief Complaint  Patient presents with  . Urinary Retention    HPI Jacob Fields is a 76 y.o. male.  The history is provided by the patient, the spouse and medical records.  Dysuria   This is a recurrent problem. The current episode started 2 days ago. The problem occurs every urination. The problem has not changed since onset.The quality of the pain is described as burning. The pain is at a severity of 3/10. The pain is mild. There has been no fever. Associated symptoms include hesitancy. Pertinent negatives include no chills, no nausea, no vomiting, no discharge, no frequency, no hematuria, no urgency and no flank pain. He has tried nothing for the symptoms. His past medical history is significant for recurrent UTIs.    Past Medical History:  Diagnosis Date  . Anemia associated with chronic renal failure   . Aortic stenosis    per last echo 11/ 2017  mild to moderate AV stenosis w/ valve area 1.96cm^2  . AVF (arteriovenous fistula) (HCC)   . Bilateral leg weakness    uses cane/ walker  . Chronic systolic CHF (congestive heart failure) (HCC)    12/ 2017  in setting NSTEMI  . Coronary artery disease cardiologist-  dr Jacinto Halim   severe 3 CAD w/ occlusion per cardiac cath done in [place]  :  hx stenting x2  . ESRD on hemodialysis (HCC)    monday , wednesday, and friday---  [facility]  . First degree heart block   . Gout    stable  . History of CVA (cerebrovascular accident)    previous hemorrhagic CVA in 2011 (per cardiologist documentation) 09/ 2014  s/p  right CEA (per head CT -- small chronic infarcts right parietal lobe, lacunar in bilateral basal ganglia and left thalamus  . History of hypertension    no issue since started hemodialysis 2016  due to hypotension  . History of non-ST elevation myocardial infarction (NSTEMI)    11/ 2017 in setting septic shock--- NSTEMI vs Demand ischemia per discharge note  . History of recurrent UTIs   . History of seizure    2010 (approx.)  x1 seizure--- per daughter unknown cause but was not put on medication nor has he had a seizure since  . History of septic shock    11/ 2017  urosepsis  . History of TIAs    per head CT  previous TIA's but per daughter pt  unaware  . Ileostomy present (HCC)    since 1970's  for ulcerative colitis  . PAF (paroxysmal atrial fibrillation) (HCC)    first dx episode 11/ 2017 in setting septic shock  . Peripheral vascular disease (HCC)   . RBBB (right bundle branch block)   . S/P coronary artery stent placement   . SunDown syndrome   . Ulcerative colitis (HCC)    s/p  total colectomy 1970's  . Urethral stricture   . Vascular dementia without behavioral disturbance    neurologist-  athar  . Wears dentures   . Wears glasses     Patient Active Problem List   Diagnosis Date Noted  . Other insomnia   . Subdural hematoma (HCC)   . Atrial fibrillation (HCC)   . NSTEMI (non-ST elevated myocardial infarction) (HCC)   . Chronic combined systolic and diastolic CHF (congestive heart failure) (HCC)   .  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 Septic shock (HCC) 05/11/2016  . Hypokalemia 05/11/2016  . 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 Physical deconditioning 06/14/2013  . UTI (urinary tract infection) 06/12/2013  . Acute encephalopathy 06/12/2013  . Gout attack 06/12/2013  . ESRD (end stage renal disease) on dialysis (HCC) 06/12/2013  . 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 Anemia 06/12/2013  . Thrombocytopenia (HCC) 06/12/2013    Past Surgical History:  Procedure Laterality Date  . AV FISTULA PLACEMENT  2001  approx.   w/ several revisions  . CARDIAC CATHETERIZATION  10/04/2015   in [place]   severe 3 vessel coronary  disease including chronic total occlusion RCA, pLAD, critical stenosis in the diagonal , severe stenosis in the mLAD  . CARDIOVASCULAR STRESS TEST  09-18-2015   [facility]   abnormal myocardial perfustion study w/ lateral scar and ischemia;  Global LV systolic fucntion in abnormal w/ regional wall motion abnormalities,  ef 51%  . CAROTID ENDARTERECTOMY Right 04/2013  . CATARACT EXTRACTION W/ INTRAOCULAR LENS  IMPLANT, BILATERAL    . CHOLECYSTECTOMY    . CORONARY ANGIOPLASTY WITH STENT PLACEMENT  2001  and 2002   mCFX;  OM1 and OM2  . CYSTOSCOPY WITH URETHRAL DILATATION N/A 08/01/2016   Procedure: CYSTOSCOPY WITH URETHRAL BALLOON DILATATION:RETROGRADE Charlean MerlURETHERGRAM;  Surgeon: Sebastian Acheheodore Manny, MD;  [facility] [place];  Service: Urology;  Laterality: N/A;  . TONSILLECTOMY  child  . TOTAL COLECTOMY  1970's   w/  Ileostomy creation (for ulcertive colitis)  . TOTAL KNEE ARTHROPLASTY  2015   unilateral  . TRANSTHORACIC ECHOCARDIOGRAM  05/12/2016   mild focal basal LVH of the septum,  ef 40%,  hypokinesis fo the vasal-midinferolateral and inferior myocardium,  grade 2 diastolic dysfunction/  mild to moderate AV stenosis (valve area 1.92cm^2), peak gradient 4116mmHg)/  mild dilated ascending aorta/  trivial MR and TR/  mild LAE/  mild reduced RVSF/          Home Medications    Prior to Admission medications   Medication Sig Start Date End Date Taking? Authorizing Provider  aspirin 81 MG chewable tablet Chew 1 tablet (81 mg total) by mouth daily. 05/24/16   Arrien, York RamMauricio Daniel, MD  atorvastatin (LIPITOR) 40 MG tablet Take 40 mg by mouth daily.    [provider]  B Complex-C-Folic Acid (RENA-VITE PO) Take 1 tablet by mouth daily.    [provider]  calcium acetate (PHOSLO) 667 MG capsule Take 667 mg by mouth 3 (three) times daily with meals.     [provider]  calcium carbonate (TUMS - DOSED IN MG ELEMENTAL CALCIUM) 500 MG chewable tablet  Chew 1 tablet by mouth as needed for indigestion or heartburn.    [provider]  donepezil (ARICEPT) 5 MG tablet Take 1 tablet (5 mg total) by mouth at bedtime. 10/17/16   Huston FoleyAthar, Saima, MD  febuxostat (ULORIC) 40 MG tablet Take 20 mg by mouth every morning.    [provider]  guaiFENesin (MUCINEX) 600 MG 12 hr tablet Take by mouth 2 (two) times daily.    [provider]  midodrine (PROAMATINE) 2.5 MG tablet Take 1 tablet (2.5 mg total) by mouth 3 (three) times daily with meals. 05/23/16   Arrien, York RamMauricio Daniel, MD  ticagrelor (BRILINTA) 90 MG TABS tablet Take by mouth 2 (two) times daily.  [provider]    Family History Family History  Problem Relation Age of Onset  . Stroke Mother   . Stroke Brother   . CAD Brother     Social History Social History  Substance Use Topics  . Smoking status: Never Smoker  . Smokeless tobacco: Never Used  . Alcohol use No     Allergies   Plavix [clopidogrel]   Review of Systems Review of Systems  Constitutional: Negative for chills, diaphoresis, fatigue and fever.  HENT: Negative for congestion and rhinorrhea.   Respiratory: Negative for cough, chest tightness, shortness of breath, wheezing and stridor.   Cardiovascular: Negative for chest pain.  Gastrointestinal: Negative for constipation, diarrhea, nausea and vomiting.  Genitourinary: Positive for dysuria and hesitancy. Negative for decreased urine volume, flank pain, frequency, hematuria and urgency.  Musculoskeletal: Negative for back pain, neck pain and neck stiffness.  Skin: Negative for rash and wound.  Neurological: Negative for weakness, light-headedness and headaches.  Psychiatric/Behavioral: Negative for agitation.  All other systems reviewed and are negative.    Physical Exam Updated Vital Signs BP 140/82 (BP Location: Right Arm)   Pulse 72   Temp 97.8 F (36.6 C) (Oral)   Resp 16   Ht 5\' 6"  (1.676 m)   Wt 62.6 kg (138 lb)    SpO2 97%   BMI 22.27 kg/m   Physical Exam  Constitutional: He appears well-developed and well-nourished. No distress.  HENT:  Head: Normocephalic.  Mouth/Throat: Oropharynx is clear and moist. No oropharyngeal exudate.  Eyes: Pupils are equal, round, and reactive to light. Conjunctivae and EOM are normal.  Neck: Normal range of motion.  Cardiovascular: Normal rate and intact distal pulses.   No murmur heard. Pulmonary/Chest: Effort normal and breath sounds normal. No stridor. No respiratory distress. He has no wheezes.  Abdominal: Soft. Bowel sounds are normal. There is no tenderness.  Musculoskeletal: He exhibits no tenderness.  Neurological: No sensory deficit. He exhibits normal muscle tone.  Skin: Skin is warm. Capillary refill takes less than 2 seconds. He is not diaphoretic. No erythema. No pallor.  Nursing note and vitals reviewed.    ED Treatments / Results  Labs (all labs ordered are listed, but only abnormal results are displayed) Labs Reviewed  URINALYSIS, ROUTINE W REFLEX MICROSCOPIC - Abnormal; Notable for the following:       Result Value   APPearance TURBID (*)    pH 8.5 (*)    Hgb urine dipstick MODERATE (*)    Protein, ur 100 (*)    Leukocytes, UA LARGE (*)    All other components within normal limits  URINALYSIS, MICROSCOPIC (REFLEX) - Abnormal; Notable for the following:    Bacteria, UA MANY (*)    Squamous Epithelial / LPF 0-5 (*)    All other components within normal limits  URINE CULTURE    EKG  EKG Interpretation None       Radiology No results found.  Procedures Procedures (including critical care time)  Medications Ordered in ED Medications - No data to display   Initial Impression / Assessment and Plan / ED Course  I have reviewed the triage vital signs and the nursing notes.  Pertinent labs & imaging results that were available during my care of the patient were reviewed by me and considered in my medical decision making (see  chart for details).     INMER NIX is a 76 y.o. male With a past medical history significant for CVA, CHF, CAD with PCI,  ESRD on dialysis, and ulcerative colitis status post ileostomy and recurrent urinary tract infections  who presents with dysuria. Patient denies fevers, chills, nausea, vomiting, flank pain, or abdominal pain. He reports that for the last few days he has had recurrence of dysuria and discoloration of urine. He reports this is similar to prior episodes of UTI. He denies other complaints and specifically denies pain in the abdomen. He denies constipation, diarrhea, or other complaints.  History and exam are seen above. On exam, ileostomy in place with no abdominal tenderness. No CVA tenderness. Lungs clear. Chest nontender.  Based on symptoms, urine was collected to look for UTI. Urine does show concern for UTI. Culture sent.   Postvoid residual was ultrasounded and was less than 100. Patient denies significant frequency, hesitancy, or pain. Do not feel patient needs Foley catheter with no significant retention. Patient is not appear septic.  Given lack of pyelonephritis symptoms, patient will be treated for urinary tract infection. Previous cultures were consulted and Keflex chosen to treat. Patient instructed to follow up with PCP for further management. Patient understood return precautions for worsened symptoms. Patient discharged in good condition.   Final Clinical Impressions(s) / ED Diagnoses   Final diagnoses:  Lower urinary tract infectious disease    New Prescriptions Discharge Medication List as of 12/28/2016 12:27 PM    START taking these medications   Details  cephALEXin (KEFLEX) 500 MG capsule Take 1 capsule (500 mg total) by mouth 2 (two) times daily., Starting Sat 12/28/2016, Until Sat 01/04/2017, Print       Clinical Impression: 1. Lower urinary tract infectious disease     Disposition: Discharge  Condition: Good  I have discussed the results,  Dx and Tx plan with the pt(& family if present). He/she/they expressed understanding and agree(s) with the plan. Discharge instructions discussed at great length. Strict return precautions discussed and pt &/or family have verbalized understanding of the instructions. No further questions at time of discharge.    Discharge Medication List as of 12/28/2016 12:27 PM    START taking these medications   Details  cephALEXin (KEFLEX) 500 MG capsule Take 1 capsule (500 mg total) by mouth 2 (two) times daily., Starting Sat 12/28/2016, Until Sat 01/04/2017, Print        Follow Up: Florentina Jenny, MD [address] [place] [address] [phone]  Schedule an appointment as soon as possible for a visit    [facility] [place] EMERGENCY DEPARTMENT [address] [id number] mc [address] [place] [address] [phone]  If symptoms worsen     Tegeler, Canary Brim, MD 12/28/16 2032

## 2016-12-28 NOTE — ED Triage Notes (Signed)
Pt c/o difficulty urinating x 2d; reports dysuria when he is able to void

## 2016-12-28 NOTE — Discharge Instructions (Signed)
Please take her antibiotics to treat her urinary tract infection. Please stay hydrated. Please follow-up with your primary care physician for further management of her symptoms. If any symptoms change or worsen or you begin having signs of worsening infection, please return to the nearest emergency department.

## 2016-12-28 NOTE — ED Notes (Signed)
ED Provider at bedside. 

## 2016-12-31 LAB — URINE CULTURE: Culture: >=100000 — AB

## 2017-01-01 ENCOUNTER — Telehealth: Payer: Self-pay | Admitting: *Deleted

## 2017-01-01 NOTE — Telephone Encounter (Signed)
Post ED Visit - Positive Culture Follow-up  Culture report reviewed by antimicrobial stewardship pharmacist:  []  Enzo BiNathan Batchelder, Pharm.D. []  Celedonio MiyamotoJeremy Frens, Pharm.D., BCPS AQ-ID []  Garvin FilaMike Maccia, Pharm.D., BCPS [x]  Georgina PillionElizabeth Martin, Pharm.D., BCPS []  Gib[place]boroMinh Pham, [address]Ph[address]D., BCPS, AAHIVP []  Estella HuskMichelle Turner, Pharm.D., BCPS, AAHIVP []  Lysle Pearlachel Rumbarger, PharmD, BCPS []  Casilda Carlsaylor Stone, PharmD, BCPS []  Pollyann SamplesAndy Johnston, PharmD, BCPS  Positive urine culture Treated with Cephalexin, organism sensitive to the same and no further patient follow-up is required at this time.  Virl AxeRobertson, Isom Kochan Talley 01/01/2017, 10:00 AM

## 2017-01-30 ENCOUNTER — Encounter: Payer: Self-pay | Admitting: Adult Health

## 2017-01-30 ENCOUNTER — Ambulatory Visit (INDEPENDENT_AMBULATORY_CARE_PROVIDER_SITE_OTHER): Payer: Medicare Other | Admitting: Adult Health

## 2017-01-30 VITALS — BP 114/74 | HR 84 | Ht 66.0 in | Wt 133.4 lb

## 2017-01-30 DIAGNOSIS — R413 Other amnesia: Secondary | ICD-10-CM | POA: Diagnosis not present

## 2017-01-30 MED ORDER — DONEPEZIL HCL 10 MG PO TABS: 10.0000 mg | ORAL_TABLET | Freq: Every day | ORAL | 11 refills | Status: AC

## 2017-01-30 NOTE — Patient Instructions (Signed)
Your Plan:  Increase Aricept 10 mg at bedtime Memory score is stable- we will continue to monitor If your symptoms worsen or you develop new symptoms please let us know.    Thank you for [facility]. I hope we have been able to provide you high quality care today.  You may receive a patient satisfaction survey over the next few weeks. We would appreciate your feedback and comments so that we may continue to improve ourselves and the health of our patients.

## 2017-01-30 NOTE — Progress Notes (Addendum)
PATIENT: Jacob Fields DOB: [birth year]  REASON FOR VISIT: follow up- memory disturbance HISTORY FROM: patient  HISTORY OF PRESENT ILLNESS: Today 01/30/17 Jacob Fields is a 76 year old male with a history of memory disturbance. He returns today for follow-up. At the last visit Aricept 5 mg was started. The patient reports that he is tolerating this well. The patient lives at [facility]. He reports that he is able to complete all ADLs independently. He no longer operates a Teacher, music. Denies any changes with his appetite. Denies any trouble sleeping. Denies any changes with his mood or behavior. His daughter is with him. She reports that he may forget where they are going especially for appointments or possibly a family reunion. She states that he tends to lose his keys. He also gets days confused. He also has trouble working the television. Denies any new neurological symptoms. Returns today for an evaluation.  HISTORY Copied from Dr. Guadelupe Sabin notes: Jacob Fields is a 76 year old right-handed gentleman with an underlying complex medical history of end-stage renal disease, on hemodialysis, recurrent UTIs, kidney stones, TIA, CVA, right carotid endarterectomy, seizure disorder, heart disease, status post stent placement, ulcerative colitis, gout, A. fib, and recent hospitalization secondary to altered mental status, secondary to sepsis in the context of UTI, who presents for follow-up consultation of his memory loss. The patient is accompanied by Chasity, his "granddaughter in law, today. I first met him on 07/25/2016 at the request of his nephrologist, at which time he was reported to have had memory loss for years, significantly worse after a recent hospitalization and ICU stay at the end of 2017. His MMSE was 21 out of 30 at the time, clock drawing 2 out of 4, animal fluency 8/m. I suggested proceeding with brain MRI. They were in the process of transitioning to assisted living. He had a brain MRI  without contrast on 08/03/2016 which I reviewed: IMPRESSION: 1. No acute intracranial abnormality. 2. Advanced chronic ischemic and small vessel disease. 3. 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   10/17/2016 : He reports doing about the same. He is no longer on the Elavil 25 mg qHS. No recent falls. Is in Assisted Living at [facility], walks outside some, appetite good. Some confusion regarding where to put things and day of the week, per text message from [place] , his daughter, to [place] (Marti's daughter in Sports coach). He feels he has adjusted well to [facility].  REVIEW OF SYSTEMS: Out of a complete 14 system review of symptoms, the patient complains only of the following symptoms, and all other reviewed systems are negative.  Difficulty urinating, painful urination, frequency of urination, urgency, muscle cramps, confusion, numbness, weakness, bruise/bleed easily, memory loss, wounds, frequent waking, snoring  ALLERGIES: Allergies  Allergen Reactions  . Plavix [Clopidogrel] Other (See Comments)    " skin sores"  Daughter states had hives    HOME MEDICATIONS: Outpatient Medications Prior to Visit  Medication Sig Dispense Refill  . calcium carbonate (TUMS - DOSED IN MG ELEMENTAL CALCIUM) 500 MG chewable tablet Chew 1 tablet by mouth as needed for indigestion or heartburn.    Marland Kitchen guaiFENesin (MUCINEX) 600 MG 12 hr tablet Take by mouth 2 (two) times daily.    . midodrine (PROAMATINE) 2.5 MG tablet Take 1 tablet (2.5 mg total) by mouth 3 (three) times daily with meals. 30 tablet 0  . ticagrelor (BRILINTA) 90 MG TABS tablet Take  by mouth 2 (two) times daily.    Marland Kitchen aspirin 81 MG chewable tablet Chew 1 tablet (81 mg total) by mouth daily. 30 tablet 0  . atorvastatin (LIPITOR) 40 MG tablet Take 40 mg by mouth daily.    . B Complex-C-Folic Acid (RENA-VITE PO) Take 1 tablet by mouth  daily.    . calcium acetate (PHOSLO) 667 MG capsule Take 667 mg by mouth 3 (three) times daily with meals.     . donepezil (ARICEPT) 5 MG tablet Take 1 tablet (5 mg total) by mouth at bedtime. 30 tablet 5  . febuxostat (ULORIC) 40 MG tablet Take 20 mg by mouth every morning.     No facility-administered medications prior to visit.     PAST MEDICAL HISTORY: Past Medical History:  Diagnosis Date  . Anemia associated with chronic renal failure   . Aortic stenosis    per last echo 11/ 2017  mild to moderate AV stenosis w/ valve area 1.96cm^2  . AVF (arteriovenous fistula) ([place])   . Bilateral leg weakness    uses cane/ walker  . Chronic systolic CHF (congestive heart failure) ([place])    12/ 2017  in setting NSTEMI  . Coronary artery disease cardiologist-  dr Einar Gip   severe 3 CAD w/ occlusion per cardiac cath done in [place], [place]  :  hx stenting x2  . ESRD on hemodialysis ([place])    monday , wednesday, and friday---  [facility]  . First degree heart block   . Gout    stable  . History of CVA (cerebrovascular accident)    previous hemorrhagic CVA in 2011 (per cardiologist documentation) 09/ 2014  s/p  right CEA (per head CT -- small chronic infarcts right parietal lobe, lacunar in bilateral basal ganglia and left thalamus  . History of hypertension    no issue since started hemodialysis 2016  due to hypotension  . History of non-ST elevation myocardial infarction (NSTEMI)    11/ 2017 in setting septic shock--- NSTEMI vs [place] ischemia per discharge note  . History of recurrent UTIs   . History of seizure    2010 (approx.)  x1 seizure--- per daughter unknown cause but was not put on medication nor has he had a seizure since  . History of septic shock    11/ 2017  urosepsis  . History of TIAs    per head CT  previous TIA's but per daughter pt  unaware  . Ileostomy present ([place])    since 1970's  for ulcerative colitis  . PAF (paroxysmal atrial fibrillation) ([place])     first dx episode 11/ 2017 in setting septic shock  . Peripheral vascular disease ([place])   . RBBB (right bundle branch block)   . S/P coronary artery stent placement   . SunDown syndrome   . Ulcerative colitis ([place])    s/p  total colectomy 1970's  . Urethral stricture   . Vascular dementia without behavioral disturbance    neurologist-  athar  . Wears dentures   . Wears glasses     PAST SURGICAL HISTORY: Past Surgical History:  Procedure Laterality Date  . AV FISTULA PLACEMENT  2001  approx.   w/ several revisions  . CARDIAC CATHETERIZATION  10/04/2015   in [place]   severe 3 vessel coronary disease including chronic total occlusion RCA, pLAD, critical stenosis in the diagonal , severe stenosis in the mLAD  . CARDIOVASCULAR STRESS TEST  09-18-2015   [facility]  abnormal myocardial perfustion study w/ lateral scar and ischemia;  Global LV systolic fucntion in abnormal w/ regional wall motion abnormalities,  ef 51%  . CAROTID ENDARTERECTOMY Right 04/2013  . CATARACT EXTRACTION W/ INTRAOCULAR LENS  IMPLANT, BILATERAL    . CHOLECYSTECTOMY    . CORONARY ANGIOPLASTY WITH STENT PLACEMENT  2001  and 2002   mCFX;  OM1 and OM2  . CYSTOSCOPY WITH URETHRAL DILATATION N/A 08/01/2016   Procedure: CYSTOSCOPY WITH URETHRAL BALLOON DILATATION:RETROGRADE Maebelle Munroe;  Surgeon: Alexis Frock, MD;  Location: [facility];  Service: Urology;  Laterality: N/A;  . TONSILLECTOMY  child  . TOTAL COLECTOMY  1970's   w/  Ileostomy creation (for ulcertive colitis)  . TOTAL KNEE ARTHROPLASTY  2015   unilateral  . TRANSTHORACIC ECHOCARDIOGRAM  05/12/2016   mild focal basal LVH of the septum,  ef 40%,  hypokinesis fo the vasal-midinferolateral and inferior myocardium,  grade 2 diastolic dysfunction/  mild to moderate AV stenosis (valve area 1.92cm^2), peak gradient 56mHg)/  mild dilated ascending aorta/  trivial MR and TR/  mild LAE/  mild reduced RVSF/       FAMILY  HISTORY: Family History  Problem Relation Age of Onset  . Stroke Mother   . Stroke Brother   . CAD Brother     SOCIAL HISTORY: Social History   Social History  . Marital status: Divorced    Spouse name: N/A  . Number of children: N/A  . Years of education: N/A   Occupational History  . Not on file.   Social History Main Topics  . Smoking status: Never Smoker  . Smokeless tobacco: Never Used  . Alcohol use No  . Drug use: No  . Sexual activity: Not on file   Other Topics Concern  . Not on file   Social History Narrative  . No narrative on file      PHYSICAL EXAM  Vitals:   01/30/17 0754  BP: 114/74  Pulse: 84  Weight: 133 lb 6.4 oz (60.5 kg)  Height: _0  (1.676 m)   Body mass index is 21.53 kg/m.  Generalized: Well developed, in no acute distress   Neurological examination  Mentation: Alert oriented to time, place, history taking. Follows all commands speech and language fluent Cranial nerve II-XII: Pupils were equal round reactive to light. Extraocular movements were full, visual field were full on confrontational test. Facial sensation and strength were normal. Uvula tongue midline. Head turning and shoulder shrug  were normal and symmetric. Motor: The motor testing reveals 5 over 5 strength of all 4 extremities. Good symmetric motor tone is noted throughout.  Sensory: Sensory testing is intact to soft touch on all 4 extremities. No evidence of extinction is noted.  Coordination: Cerebellar testing reveals good finger-nose-finger and heel-to-shin bilaterally.  Gait and station: Gait is normal. Tandem gait is normal. Romberg is negative. No drift is seen.  Reflexes: Deep tendon reflexes are symmetric and normal bilaterally.   DIAGNOSTIC DATA (LABS, IMAGING, TESTING) - I reviewed patient records, labs, notes, testing and imaging myself where available.  Lab Results  Component Value Date   WBC 5.2 07/14/2016   HGB 10.5 (L) 08/01/2016   HCT 31.0 (L)  08/01/2016   MCV 106.1 (H) 07/14/2016   PLT 161 07/14/2016      Component Value Date/Time   NA 137 08/01/2016 1244   K 4.8 08/01/2016 1244   CL 95 (L) 08/01/2016 1244   CO2 20 (L) 05/23/2016 0310   GLUCOSE  110 (H) 08/01/2016 1244   BUN 20 08/01/2016 1244   CREATININE 4.80 (H) 08/01/2016 1244   CALCIUM 9.4 05/23/2016 0310   PROT 6.3 (L) 05/12/2016 0340   ALBUMIN 3.7 05/23/2016 0310   AST 45 (H) 05/12/2016 0340   ALT 23 05/12/2016 0340   ALKPHOS 51 05/12/2016 0340   BILITOT 1.0 05/12/2016 0340   GFRNONAA 6 (L) 05/23/2016 0310   GFRAA 7 (L) 05/23/2016 0310    Lab Results  Component Value Date   HGBA1C 5.4 05/13/2016   Lab Results  Component Value Date   VITAMINB12 526 05/19/2016   Lab Results  Component Value Date   TSH 4.958 (H) 05/11/2016      ASSESSMENT AND PLAN 76 y.o. year old male  has a past medical history of Anemia associated with chronic renal failure; Aortic stenosis; AVF (arteriovenous fistula) ([place]); Bilateral leg weakness; Chronic systolic CHF (congestive heart failure) ([place]); Coronary artery disease (cardiologist-  dr Einar Gip); ESRD on hemodialysis [facility]); First degree heart block; Gout; History of CVA (cerebrovascular accident); History of hypertension; History of non-ST elevation myocardial infarction (NSTEMI); History of recurrent UTIs; History of seizure; History of septic shock; History of TIAs; Ileostomy present ([place]); PAF (paroxysmal atrial fibrillation) ([place]); Peripheral vascular disease ([place]); RBBB (right bundle branch block); S/P coronary artery stent placement; SunDown syndrome; Ulcerative colitis ([place]); Urethral stricture; Vascular dementia without behavioral disturbance; Wears dentures; and Wears glasses. here with:  1. Memory disturbance  Overall the patient's memory score has remained the same. He has tolerated Aricept well. We will increase his Aricept to 10 mg daily at bedtime. I have advised that if he has any significant side effects they should  let us know. He will follow-up in 6 months or sooner if needed.  I spent 15 minutes with the patient. 50% of this time was spent reviewing medication and memory score.     Ward Givens, MSN, NP-C 01/30/2017, 8:11 AM [facility] [address] [place], [place] [address] [phone]  I reviewed the above note and documentation by the Nurse Practitioner and agree with the history, physical exam, assessment and plan as outlined above. I was immediately available for face-to-face consultation. Star Age, MD, PhD [facility])

## 2017-02-06 ENCOUNTER — Ambulatory Visit: Payer: Medicare Other | Admitting: Adult Health

## 2017-02-08 ENCOUNTER — Emergency Department (HOSPITAL_COMMUNITY)
Admission: EM | Admit: 2017-02-08 | Discharge: 2017-02-08 | Disposition: A | Discharge status: Home / Self Care | Payer: Medicare Other | Attending: Emergency Medicine | Admitting: Emergency Medicine

## 2017-02-08 ENCOUNTER — Emergency Department (HOSPITAL_COMMUNITY): Payer: Medicare Other

## 2017-02-08 DIAGNOSIS — I5042 Chronic combined systolic (congestive) and diastolic (congestive) heart failure: Secondary | ICD-10-CM | POA: Insufficient documentation

## 2017-02-08 DIAGNOSIS — Z7982 Long term (current) use of aspirin: Secondary | ICD-10-CM | POA: Diagnosis not present

## 2017-02-08 DIAGNOSIS — S022XXB Fracture of nasal bones, initial encounter for open fracture: Secondary | ICD-10-CM | POA: Insufficient documentation

## 2017-02-08 DIAGNOSIS — Z992 Dependence on renal dialysis: Secondary | ICD-10-CM | POA: Diagnosis not present

## 2017-02-08 DIAGNOSIS — Y998 Other external cause status: Secondary | ICD-10-CM | POA: Insufficient documentation

## 2017-02-08 DIAGNOSIS — Y939 Activity, unspecified: Secondary | ICD-10-CM | POA: Diagnosis not present

## 2017-02-08 DIAGNOSIS — I132 Hypertensive heart and chronic kidney disease with heart failure and with stage 5 chronic kidney disease, or end stage renal disease: Secondary | ICD-10-CM | POA: Insufficient documentation

## 2017-02-08 DIAGNOSIS — S0121XA Laceration without foreign body of nose, initial encounter: Secondary | ICD-10-CM | POA: Diagnosis not present

## 2017-02-08 DIAGNOSIS — Z8673 Personal history of transient ischemic attack (TIA), and cerebral infarction without residual deficits: Secondary | ICD-10-CM | POA: Diagnosis not present

## 2017-02-08 DIAGNOSIS — W0110XA Fall on same level from slipping, tripping and stumbling with subsequent striking against unspecified object, initial encounter: Secondary | ICD-10-CM | POA: Diagnosis not present

## 2017-02-08 DIAGNOSIS — W19XXXA Unspecified fall, initial encounter: Secondary | ICD-10-CM

## 2017-02-08 DIAGNOSIS — I2510 Atherosclerotic heart disease of native coronary artery without angina pectoris: Secondary | ICD-10-CM | POA: Diagnosis not present

## 2017-02-08 DIAGNOSIS — S0181XA Laceration without foreign body of other part of head, initial encounter: Secondary | ICD-10-CM | POA: Diagnosis not present

## 2017-02-08 DIAGNOSIS — I252 Old myocardial infarction: Secondary | ICD-10-CM | POA: Diagnosis not present

## 2017-02-08 DIAGNOSIS — Z23 Encounter for immunization: Secondary | ICD-10-CM | POA: Insufficient documentation

## 2017-02-08 DIAGNOSIS — N186 End stage renal disease: Secondary | ICD-10-CM | POA: Insufficient documentation

## 2017-02-08 DIAGNOSIS — I480 Paroxysmal atrial fibrillation: Secondary | ICD-10-CM | POA: Diagnosis not present

## 2017-02-08 DIAGNOSIS — Y92121 Bathroom in nursing home as the place of occurrence of the external cause: Secondary | ICD-10-CM | POA: Diagnosis not present

## 2017-02-08 DIAGNOSIS — Z79899 Other long term (current) drug therapy: Secondary | ICD-10-CM | POA: Diagnosis not present

## 2017-02-08 DIAGNOSIS — I440 Atrioventricular block, first degree: Secondary | ICD-10-CM | POA: Diagnosis not present

## 2017-02-08 DIAGNOSIS — Z7901 Long term (current) use of anticoagulants: Secondary | ICD-10-CM | POA: Diagnosis not present

## 2017-02-08 LAB — CBG MONITORING, ED: Glucose-Capillary: 92 mg/dL (ref 65–99)

## 2017-02-08 MED ORDER — TETANUS-DIPHTHERIA TOXOIDS TD 5-2 LFU IM INJ
0.5000 mL | INJECTION | Freq: Once | INTRAMUSCULAR | Status: AC
  Administered 2017-02-08: 0.5 mL via INTRAMUSCULAR
  Filled 2017-02-08: qty 0.5

## 2017-02-08 MED ORDER — CEPHALEXIN 250 MG PO CAPS: 250.0000 mg | ORAL_CAPSULE | Freq: Four times a day (QID) | ORAL | 0 refills | Status: AC

## 2017-02-08 MED ORDER — TRAMADOL HCL 50 MG PO TABS: 50.0000 mg | ORAL_TABLET | Freq: Four times a day (QID) | ORAL | 0 refills | Status: AC | PRN

## 2017-02-08 MED ORDER — LIDOCAINE HCL (PF) 1 % IJ SOLN
INTRAMUSCULAR | Status: AC
  Filled 2017-02-08: qty 30

## 2017-02-08 MED ORDER — LIDOCAINE HCL 2 % IJ SOLN: 10.0000 mL | Freq: Once | INTRAMUSCULAR | Status: DC

## 2017-02-08 MED ORDER — LIDOCAINE-EPINEPHRINE-TETRACAINE (LET) SOLUTION
3.0000 mL | Freq: Once | NASAL | Status: AC
  Administered 2017-02-08: 3 mL via TOPICAL
  Filled 2017-02-08: qty 3

## 2017-02-08 MED ORDER — ACETAMINOPHEN 325 MG PO TABS
650.0000 mg | ORAL_TABLET | Freq: Once | ORAL | Status: AC
  Administered 2017-02-08: 650 mg via ORAL
  Filled 2017-02-08: qty 2

## 2017-02-08 MED ORDER — HYDROMORPHONE HCL 1 MG/ML IJ SOLN
0.5000 mg | Freq: Once | INTRAMUSCULAR | Status: AC
  Administered 2017-02-08: 0.5 mg via INTRAMUSCULAR
  Filled 2017-02-08: qty 1

## 2017-02-08 NOTE — ED Provider Notes (Signed)
The patient is a 76 year old male, on aspirin, at assisted living facility, history of end-stage renal disease Monday Wednesday and Friday who had a mechanical fall just prior to arrival he fell forward striking his nasal bridge and right eyebrow on something walking towards the bathroom, he does not recall this fall, he has some pain in the right wrist as well as the left ribs but has no difficulty taking deep breaths. He denies any injuries of the arms or the legs otherwise. The patient has on exam tenderness over the left ribs, tenderness over the nasal bridge, tenderness with swelling in this area, small laceration with bleeding, tenderness over the right eyebrow, laceration of the upper lip on the inside, this is not through and through, mild tenderness of the teeth without subluxation. No deformity of any of the 4 extremities, normal fistula of the left upper extremity with a good thrill, no injury to this area. He does have an old yellow haze bruise of the left upper lateral chest. There is a soft nontender abdomen, mental status is normal, follows commands without difficulty.  Has multiple scabbed over lesions across the upper arms / shoulder, and upper back.  He will need former evaluation with imaging of the CT of the head, cervical spine and maxillofacial bones as well as rib x-rays on the left, wrist on the right, lidocaine and primary repair of lacerations as needed.  Evaluation for fractures / bleeding Fall likely mechanical VS as below, normal - no injuries of the vascular access No respiratory distress Baseline neuro according to family. Rash - diffuse - likely the itching of dialysis.  No signs of bacterial infection.  Vitals:   02/08/17 1700 02/08/17 1715 02/08/17 1900 02/08/17 1930  BP: (!) 152/86 (!) 144/76 117/71 135/73  Pulse: 71 72 70 73  Resp: 20 (!) 21 14 18   Temp:      TempSrc:      SpO2: 100% (!) 87% 100% 100%     Medical screening examination/treatment/procedure(s)  were conducted as a shared visit with non-physician practitioner(s) and myself.  I personally evaluated the patient during the encounter.  Clinical Impression:   Final diagnoses:  Fall, initial encounter  Laceration of nose, initial encounter  Laceration of skin of face, initial encounter  Open fracture of nasal bone, initial encounter      Eber Hong, MD 02/19/17 [phone]

## 2017-02-08 NOTE — ED Triage Notes (Signed)
Patient is from [facility]. Patient stumbled and fell in the bathroom today striking his face.  Lac above right eyebrow and bridge of nose.  Lip is swollen and he C/O having a sore right wrist and left upper abdominal pain..  No LOC.  Patient is ESRD - HD MWF

## 2017-02-08 NOTE — ED Provider Notes (Signed)
[facility]-EMERGENCY DEPT Provider Note   CSN: [id number] Arrival date & time: 02/08/17  1354     History   Chief Complaint Chief Complaint  Patient presents with  . Fall    HPI Jacob Fields is a 76 y.o. male.  HPI  Jacob Fields is a 76yo male with a history of ESRD, CVA (on chronic Brilinta and Aspirin), Colectomy with Ileostomy, CAD who presents to the Emergency Department after having an unwitnessed mechanical fall in his nursing facility this afternoon. Patient states that he fell forward onto his face and arm while in the bathroom. He is unsure if he fell today or yesterday, but daughter at bedside states that per his nursing facility the fall was around 12:30 today. His daughter adds that he has some memory problems at baseline, sees a neurologist and that this is not new. She states that his cognition seems to be at baseline. Patient states that he remembers the entire fall, denies loss of consciousness or heightened confusion before or after the fall. He states that his mouth, left rib cage and right wrist are hurting him. He denies headache, visual changes, numbness, weakness, chest palpitations, chest pain, back pain, neck pain, abdominal pain, nausea, vomiting, diarrhea, dysuria. Per nursing facility notes, Jacob Fields took brilinta and aspirin today. Patient's daughter also states that patient recently had a UTI. Unknown last known tetanus shot.   Past Medical History:  Diagnosis Date  . Anemia associated with chronic renal failure   . Aortic stenosis    per last echo 11/ 2017  mild to moderate AV stenosis w/ valve area 1.96cm^2  . AVF (arteriovenous fistula) (HCC)   . Bilateral leg weakness    uses cane/ walker  . Chronic systolic CHF (congestive heart failure) (HCC)    12/ 2017  in setting NSTEMI  . Coronary artery disease cardiologist-  dr Jacinto Halim   severe 3 CAD w/ occlusion per cardiac cath done in [place], [place]  :  hx stenting x2  . ESRD on hemodialysis (HCC)    monday ,  wednesday, and friday---  [facility]  . First degree heart block   . Gout    stable  . History of CVA (cerebrovascular accident)    previous hemorrhagic CVA in 2011 (per cardiologist documentation) 09/ 2014  s/p  right CEA (per head CT -- small chronic infarcts right parietal lobe, lacunar in bilateral basal ganglia and left thalamus  . History of hypertension    no issue since started hemodialysis 2016  due to hypotension  . History of non-ST elevation myocardial infarction (NSTEMI)    11/ 2017 in setting septic shock--- NSTEMI vs [place] ischemia per discharge note  . History of recurrent UTIs   . History of seizure    2010 (approx.)  x1 seizure--- per daughter unknown cause but was not put on medication nor has he had a seizure since  . History of septic shock    11/ 2017  urosepsis  . History of TIAs    per head CT  previous TIA's but per daughter pt  unaware  . Ileostomy present (HCC)    since 1970's  for ulcerative colitis  . PAF (paroxysmal atrial fibrillation) (HCC)    first dx episode 11/ 2017 in setting septic shock  . Peripheral vascular disease (HCC)   . RBBB (right bundle branch block)   . S/P coronary artery stent placement   . SunDown syndrome   . Ulcerative colitis (HCC)  s/p  total colectomy 1970's  . Urethral stricture   . Vascular dementia without behavioral disturbance    neurologist-  athar  . Wears dentures   . Wears glasses     Patient Active Problem List   Diagnosis Date Noted  . Other insomnia   . Subdural hematoma (HCC)   . Atrial fibrillation (HCC)   . NSTEMI (non-ST elevated myocardial infarction) (HCC)   . Chronic combined systolic and diastolic CHF (congestive heart failure) (HCC)   . 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 Septic shock (HCC) 05/11/2016  . Hypokalemia 05/11/2016  . 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 Physical deconditioning 06/14/2013  . UTI (urinary tract  infection) 06/12/2013  . Acute encephalopathy 06/12/2013  . Gout attack 06/12/2013  . ESRD (end stage renal disease) on dialysis (HCC) 06/12/2013  . 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 Anemia 06/12/2013  . Thrombocytopenia (HCC) 06/12/2013    Past Surgical History:  Procedure Laterality Date  . AV FISTULA PLACEMENT  2001  approx.   w/ several revisions  . CARDIAC CATHETERIZATION  10/04/2015   in [place]   severe 3 vessel coronary disease including chronic total occlusion RCA, pLAD, critical stenosis in the diagonal , severe stenosis in the mLAD  . CARDIOVASCULAR STRESS TEST  09-18-2015   [facility]   abnormal myocardial perfustion study w/ lateral scar and ischemia;  Global LV systolic fucntion in abnormal w/ regional wall motion abnormalities,  ef 51%  . CAROTID ENDARTERECTOMY Right 04/2013  . CATARACT EXTRACTION W/ INTRAOCULAR LENS  IMPLANT, BILATERAL    . CHOLECYSTECTOMY    . CORONARY ANGIOPLASTY WITH STENT PLACEMENT  2001  and 2002   mCFX;  OM1 and OM2  . CYSTOSCOPY WITH URETHRAL DILATATION N/A 08/01/2016   Procedure: CYSTOSCOPY WITH URETHRAL BALLOON DILATATION:RETROGRADE Charlean Merl;  Surgeon: Sebastian Ache, MD;  Location: [facility];  Service: Urology;  Laterality: N/A;  . TONSILLECTOMY  child  . TOTAL COLECTOMY  1970's   w/  Ileostomy creation (for ulcertive colitis)  . TOTAL KNEE ARTHROPLASTY  2015   unilateral  . TRANSTHORACIC ECHOCARDIOGRAM  05/12/2016   mild focal basal LVH of the septum,  ef 40%,  hypokinesis fo the vasal-midinferolateral and inferior myocardium,  grade 2 diastolic dysfunction/  mild to moderate AV stenosis (valve area 1.92cm^2), peak gradient 42mmHg)/  mild dilated ascending aorta/  trivial MR and TR/  mild LAE/  mild reduced RVSF/          Home Medications    Prior to Admission medications   Medication Sig Start Date End Date Taking?  Authorizing Provider  aspirin 81 MG chewable tablet Chew 1 tablet (81 mg total) by mouth daily. 05/24/16  Yes Arrien, York Ram, MD  atorvastatin (LIPITOR) 40 MG tablet Take 40 mg by mouth daily.   Yes [provider]  B Complex-C-Folic Acid (RENA-VITE PO) Take 1 tablet by mouth daily.   Yes [provider]  calcium acetate (PHOSLO) 667 MG capsule Take 1,334 mg by mouth daily.    Yes [provider]  calcium carbonate (TUMS - DOSED IN MG ELEMENTAL CALCIUM) 500 MG chewable tablet Chew 1 tablet by mouth every 4 (four) hours as needed for indigestion or heartburn.    Yes [provider]  DIPHENHYDRAMINE HCL, TOPICAL, (BENADRYL ITCH STOPPING) 2 % GEL Apply 1 application topically every 12 (twelve) hours as needed (  for itching to left shoulder).   Yes [provider]  donepezil (ARICEPT) 10 MG tablet Take 1 tablet (10 mg total) by mouth at bedtime. 01/30/17  Yes Butch Penny, NP  febuxostat (ULORIC) 40 MG tablet Take 20 mg by mouth every morning.   Yes [provider]  guaiFENesin (MUCINEX) 600 MG 12 hr tablet Take 600 mg by mouth every 12 (twelve) hours as needed for cough.    Yes [provider]  midodrine (PROAMATINE) 2.5 MG tablet Take 1 tablet (2.5 mg total) by mouth 3 (three) times daily with meals. Patient taking differently: Take 2.5 mg by mouth every 8 (eight) hours as needed (for hypotension).  05/23/16  Yes Arrien, York Ram, MD  ticagrelor (BRILINTA) 90 MG TABS tablet Take 90 mg by mouth 2 (two) times daily.    Yes [provider]  cephALEXin (KEFLEX) 250 MG capsule Take 1 capsule (250 mg total) by mouth 4 (four) times daily. 02/08/17   Kellie Shropshire, PA-C  traMADol (ULTRAM) 50 MG tablet Take 1 tablet (50 mg total) by mouth every 6 (six) hours as needed. 02/08/17   Kellie Shropshire, PA-C    Family History Family History  Problem Relation Age of Onset  . Stroke Mother   . Stroke Brother   . CAD  Brother     Social History Social History  Substance Use Topics  . Smoking status: Never Smoker  . Smokeless tobacco: Never Used  . Alcohol use No     Allergies   Plavix [clopidogrel]   Review of Systems Review of Systems  Constitutional: Negative for chills, fatigue and fever.  HENT: Positive for dental problem (dentures), facial swelling (upper lip) and nosebleeds. Negative for ear discharge, ear pain, sore throat and tinnitus.   Eyes: Negative for pain and visual disturbance.  Respiratory: Negative for cough, shortness of breath and wheezing.   Cardiovascular: Positive for chest pain (left lower ribs). Negative for palpitations.  Gastrointestinal: Negative for abdominal pain, diarrhea, nausea and vomiting.  Genitourinary: Positive for difficulty urinating (chronic).  Musculoskeletal: Positive for arthralgias (right wrist, left rib cage) and joint swelling. Negative for back pain and neck pain.  Neurological: Negative for dizziness, speech difficulty, weakness, light-headedness, numbness and headaches.  Psychiatric/Behavioral: Negative for agitation.     Physical Exam Updated Vital Signs BP 135/73   Pulse 73   Temp 98 F (36.7 C) (Oral)   Resp 18   SpO2 100%   Physical Exam  Constitutional: He appears well-developed and well-nourished. No distress.  HENT:  Head: Normocephalic. Head is with laceration (1in laceration located on the right eyebrow. Eclipse-shaped laceration noted on the nose. See Picture. ). Head is without raccoon's eyes and without Battle's sign.  Right Ear: External ear and ear canal normal.  Left Ear: External ear and ear canal normal.  Nose: Nose lacerations and sinus tenderness present. No septal deviation or nasal septal hematoma. Epistaxis is observed.    Mouth/Throat: Oropharynx is clear and moist. He has dentures. Lacerations (Located inside upper lip on the left side. Swelling noted on outer lip. Tender to palpation on the outer lip. )  present.  Eyes: Pupils are equal, round, and reactive to light. Conjunctivae and EOM are normal. Right eye exhibits no discharge. Left eye exhibits no discharge. Right eye exhibits no nystagmus. Left eye exhibits no nystagmus.    Neck: Normal range of motion and full passive range of motion without pain. Neck supple. No spinous process tenderness and no muscular  tenderness present.  Cardiovascular: Normal rate and regular rhythm.  Exam reveals no gallop and no friction rub.   Murmur (systolic ejection murmur) heard. Pulmonary/Chest: Effort normal. No respiratory distress. He has no wheezes. He has no rales. He exhibits tenderness (tenderness noted on left lower ribcage, old ecchymosis noted over the chest wall).  Abdominal: Soft. Bowel sounds are normal. There is no tenderness. There is no guarding.  Ileostomy present  Musculoskeletal:       Right wrist: He exhibits tenderness and bony tenderness. He exhibits normal range of motion, no swelling, no crepitus, no deformity and no laceration.  Neurological: He is alert. He has normal strength. No cranial nerve deficit or sensory deficit. Coordination normal.  Skin: Skin is warm and dry. He is not diaphoretic.  Left and right shoulders and upper chest with several small areas (1cm) of skin breakdown with mild surrounding erythema and crusting. No warmth, tenderness, purulence noted.   Psychiatric: He has a normal mood and affect. His speech is normal and behavior is normal. Cognition and memory are impaired (Patient is alert and oriented to person, place. Not oriented to time, according to daughter at bedside this is normal).  Nursing note and vitals reviewed.    ED Treatments / Results  Labs (all labs ordered are listed, but only abnormal results are displayed) Labs Reviewed  CBG MONITORING, ED    EKG  EKG Interpretation None       Radiology Dg Ribs Unilateral W/chest Left  Result Date: 02/08/2017 CLINICAL DATA:  Fall. Left upper  abdominal pain. Initial encounter. EXAM: LEFT RIBS AND CHEST - 3+ VIEW COMPARISON:  05/11/2016 chest x-ray FINDINGS: Pain markers project just below the left costochondral junction. No acute fracture is noted. There are multiple remote and healed lateral left rib fractures. No hemothorax or pneumothorax. Coronary stenting and confluent atherosclerotic calcification on the aorta and subclavians. Mild retrocardiac scarring. IMPRESSION: No acute finding, including acute rib fracture. Electronically Signed   By: Marnee Spring M.D.   On: 02/08/2017 16:26   Dg Wrist Complete Right  Result Date: 02/08/2017 CLINICAL DATA:  Fall EXAM: RIGHT WRIST - COMPLETE 3+ VIEW COMPARISON:  None. FINDINGS: No fracture or dislocation is seen. The joint spaces are preserved. Mild chondrocalcinosis the radiocarpal articulation and TFCC. The visualized soft tissues are unremarkable. Vascular calcifications. IMPRESSION: No fracture or dislocation is seen. Electronically Signed   By: Charline Bills M.D.   On: 02/08/2017 16:21   Ct Head Wo Contrast  Result Date: 02/08/2017 CLINICAL DATA:  Fall, hit face on floor. EXAM: CT HEAD WITHOUT CONTRAST CT MAXILLOFACIAL WITHOUT CONTRAST CT CERVICAL SPINE WITHOUT CONTRAST TECHNIQUE: Multidetector CT imaging of the head, cervical spine, and maxillofacial structures were performed using the standard protocol without intravenous contrast. Multiplanar CT image reconstructions of the cervical spine and maxillofacial structures were also generated. COMPARISON:  11/28/2016 FINDINGS: CT HEAD FINDINGS Brain: There is atrophy and chronic small vessel disease changes. No acute intracranial abnormality. Specifically, no hemorrhage, hydrocephalus, mass lesion, acute infarction, or significant intracranial injury. Vascular: No hyperdense vessel or unexpected calcification. Diffuse atherosclerosis. Skull: No acute calvarial abnormality. Other: Paranasal sinuses clear.  Mastoids clear. CT MAXILLOFACIAL  FINDINGS Osseous: Fractures are noted through the nasal bones. Left nasal bones are mildly displaced. Probable fracture through the anterior septum. Mandible is intact. Zygomatic arches intact. No additional facial fracture. Orbits: Negative. No traumatic or inflammatory finding. Sinuses: Paranasal sinuses clear. Soft tissues: Soft tissue swelling within the nose. CT CERVICAL SPINE FINDINGS Alignment: Slight  anterolisthesis of C3 on C4. Skull base and vertebrae: No fracture. Soft tissues and spinal canal: Prevertebral soft tissues are normal. No epidural or paraspinal hematoma. Disc levels: Diffuse degenerative disc and facet disease, advanced. Severe bilateral multi level neural foraminal narrowing. Upper chest: Scarring in the apices. Other: None IMPRESSION: No acute intracranial abnormality. Atrophy, chronic small vessel disease. Nasal bone fractures and probable fracture through the anterior septum. No acute bony abnormality in the cervical spine. Advanced degenerative disc and facet disease. Electronically Signed   By: Charlett Nose M.D.   On: 02/08/2017 16:32   Ct Cervical Spine Wo Contrast  Result Date: 02/08/2017 CLINICAL DATA:  Fall, hit face on floor. EXAM: CT HEAD WITHOUT CONTRAST CT MAXILLOFACIAL WITHOUT CONTRAST CT CERVICAL SPINE WITHOUT CONTRAST TECHNIQUE: Multidetector CT imaging of the head, cervical spine, and maxillofacial structures were performed using the standard protocol without intravenous contrast. Multiplanar CT image reconstructions of the cervical spine and maxillofacial structures were also generated. COMPARISON:  11/28/2016 FINDINGS: CT HEAD FINDINGS Brain: There is atrophy and chronic small vessel disease changes. No acute intracranial abnormality. Specifically, no hemorrhage, hydrocephalus, mass lesion, acute infarction, or significant intracranial injury. Vascular: No hyperdense vessel or unexpected calcification. Diffuse atherosclerosis. Skull: No acute calvarial abnormality.  Other: Paranasal sinuses clear.  Mastoids clear. CT MAXILLOFACIAL FINDINGS Osseous: Fractures are noted through the nasal bones. Left nasal bones are mildly displaced. Probable fracture through the anterior septum. Mandible is intact. Zygomatic arches intact. No additional facial fracture. Orbits: Negative. No traumatic or inflammatory finding. Sinuses: Paranasal sinuses clear. Soft tissues: Soft tissue swelling within the nose. CT CERVICAL SPINE FINDINGS Alignment: Slight anterolisthesis of C3 on C4. Skull base and vertebrae: No fracture. Soft tissues and spinal canal: Prevertebral soft tissues are normal. No epidural or paraspinal hematoma. Disc levels: Diffuse degenerative disc and facet disease, advanced. Severe bilateral multi level neural foraminal narrowing. Upper chest: Scarring in the apices. Other: None IMPRESSION: No acute intracranial abnormality. Atrophy, chronic small vessel disease. Nasal bone fractures and probable fracture through the anterior septum. No acute bony abnormality in the cervical spine. Advanced degenerative disc and facet disease. Electronically Signed   By: Charlett Nose M.D.   On: 02/08/2017 16:32   Ct Maxillofacial Wo Contrast  Result Date: 02/08/2017 CLINICAL DATA:  Fall, hit face on floor. EXAM: CT HEAD WITHOUT CONTRAST CT MAXILLOFACIAL WITHOUT CONTRAST CT CERVICAL SPINE WITHOUT CONTRAST TECHNIQUE: Multidetector CT imaging of the head, cervical spine, and maxillofacial structures were performed using the standard protocol without intravenous contrast. Multiplanar CT image reconstructions of the cervical spine and maxillofacial structures were also generated. COMPARISON:  11/28/2016 FINDINGS: CT HEAD FINDINGS Brain: There is atrophy and chronic small vessel disease changes. No acute intracranial abnormality. Specifically, no hemorrhage, hydrocephalus, mass lesion, acute infarction, or significant intracranial injury. Vascular: No hyperdense vessel or unexpected calcification.  Diffuse atherosclerosis. Skull: No acute calvarial abnormality. Other: Paranasal sinuses clear.  Mastoids clear. CT MAXILLOFACIAL FINDINGS Osseous: Fractures are noted through the nasal bones. Left nasal bones are mildly displaced. Probable fracture through the anterior septum. Mandible is intact. Zygomatic arches intact. No additional facial fracture. Orbits: Negative. No traumatic or inflammatory finding. Sinuses: Paranasal sinuses clear. Soft tissues: Soft tissue swelling within the nose. CT CERVICAL SPINE FINDINGS Alignment: Slight anterolisthesis of C3 on C4. Skull base and vertebrae: No fracture. Soft tissues and spinal canal: Prevertebral soft tissues are normal. No epidural or paraspinal hematoma. Disc levels: Diffuse degenerative disc and facet disease, advanced. Severe bilateral multi level neural foraminal narrowing. Upper  chest: Scarring in the apices. Other: None IMPRESSION: No acute intracranial abnormality. Atrophy, chronic small vessel disease. Nasal bone fractures and probable fracture through the anterior septum. No acute bony abnormality in the cervical spine. Advanced degenerative disc and facet disease. Electronically Signed   By: Charlett Nose M.D.   On: 02/08/2017 16:32    Procedures .Marland KitchenLaceration Repair Date/Time: 02/08/2017 7:03 PM Performed by: Kellie Shropshire Authorized by: Kellie Shropshire   Consent:    Consent obtained:  Verbal   Consent given by:  Patient   Risks discussed:  Infection and pain   Alternatives discussed:  No treatment Anesthesia (see MAR for exact dosages):    Anesthesia method:  Topical application   Topical anesthetic:  LET Laceration details:    Location: nose.   Length (cm):  2 Repair type:    Repair type:  Simple Pre-procedure details:    Preparation:  Patient was prepped and draped in usual sterile fashion Exploration:    Hemostasis achieved with:  LET   Wound exploration: wound explored through full range of motion     Contaminated: no    Treatment:    Area cleansed with:  Saline   Amount of cleaning:  Standard   Irrigation solution:  Sterile saline   Irrigation method:  Syringe   Visualized foreign bodies/material removed: no   Skin repair:    Repair method:  Sutures   Suture size:  6-0   Suture material:  Prolene   Suture technique:  Simple interrupted   Number of sutures:  2 Approximation:    Approximation:  Close   Vermilion border: well-aligned   Post-procedure details:    Dressing:  Tube gauze   Patient tolerance of procedure:  Tolerated well, no immediate complications  .Marland KitchenLaceration Repair Date/Time: 02/08/2017 8:51 PM Performed by: Kellie Shropshire Authorized by: Kellie Shropshire   Consent:    Consent obtained:  Verbal   Consent given by:  Patient   Risks discussed:  Infection and pain   Alternatives discussed:  No treatment Anesthesia (see MAR for exact dosages):    Anesthesia method:  Local infiltration   Local anesthetic:  Lidocaine 1% w/o epi Laceration details:    Location:  Face   Face location:  R eyebrow   Length (cm):  2 Repair type:    Repair type:  Simple Pre-procedure details:    Preparation:  Patient was prepped and draped in usual sterile fashion Exploration:    Hemostasis achieved with:  Direct pressure   Wound exploration: wound explored through full range of motion     Contaminated: no   Treatment:    Area cleansed with:  Saline   Amount of cleaning:  Standard   Irrigation solution:  Sterile saline   Irrigation method:  Syringe   Visualized foreign bodies/material removed: no   Skin repair:    Repair method:  Sutures   Suture size:  6-0   Suture material:  Prolene   Number of sutures:  4 Approximation:    Approximation:  Close   Vermilion border: well-aligned   Post-procedure details:    Dressing:  Tube gauze   Patient tolerance of procedure:  Tolerated well, no immediate complications   (including critical care time)  Medications Ordered in ED Medications    acetaminophen (TYLENOL) tablet 650 mg (650 mg Oral Given 02/08/17 1656)  HYDROmorphone (DILAUDID) injection 0.5 mg (0.5 mg Intramuscular Given 02/08/17 1658)  lidocaine-EPINEPHrine-tetracaine (LET) solution (3 mLs Topical Given 02/08/17 1735)  tetanus &  diphtheria toxoids (adult) [facility]) injection 0.5 mL (0.5 mLs Intramuscular Given 02/08/17 1929)     Initial Impression / Assessment and Plan / ED Course  I have reviewed the triage vital signs and the nursing notes.  Pertinent labs & imaging results that were available during my care of the patient were reviewed by me and considered in my medical decision making (see chart for details).   Patient presents after an unmechanical fall at his nursing facility. He is on chronic Aspirin and Brilinta. Imaging reviewed. CT head shows no acute intracranial abnormality, no intracranial bleed. It does reveal nasal bone fractures. Xray of L ribs does not show fracture. Xray of right rist without fracture.   Patient's lacerations sutured and dressing applied. Wound care discussed with patient and family at bedside. Discussed suture removal in 7 days. Keflex given for open nasal bone fracture. Tetanus shot given. Patient agrees to plan, including finishing course of PO antibiotic.   Patient reevaluated, vital signs stable and no focal neurological deficits on exam. I feel that he is safe to go home at this time. Discussed case with Dr. Hyacinth Meeker who saw that patient and agrees to the plan. Return precautions given to patient and family at bedside who agree and voice understanding.    Final Clinical Impressions(s) / ED Diagnoses   Final diagnoses:  Fall, initial encounter  Laceration of nose, initial encounter  Laceration of skin of face, initial encounter  Open fracture of nasal bone, initial encounter    New Prescriptions Discharge Medication List as of 02/08/2017  7:07 PM    START taking these medications   Details  cephALEXin (KEFLEX) 250 MG capsule  Take 1 capsule (250 mg total) by mouth 4 (four) times daily., Starting Sat 02/08/2017, Print    traMADol (ULTRAM) 50 MG tablet Take 1 tablet (50 mg total) by mouth every 6 (six) hours as needed., Starting Sat 02/08/2017, Print         Kellie Shropshire, PA-C 02/08/17 2317    Eber Hong, MD 02/19/17 [phone]

## 2017-02-08 NOTE — Discharge Instructions (Signed)
Take antibiotic for 7days  WOUND CARE Please have stitches removed in 7 days.   Keep area clean and dry for 24 hours. Do not remove bandage, if applied.  After 24 hours, remove bandage and wash wound gently with mild soap and warm water. Reapply a new bandage after cleaning wound, if directed.  Continue daily cleansing with soap and water until stitches/staples are removed.  Do not apply any ointments or creams to the wound while stitches/staples are in place, as this may cause delayed healing. Return if you experience any of the following signs of infection: Swelling, redness, pus drainage, streaking, fever >101.0 F  Return if you experience excessive bleeding that does not stop after 15-20 minutes of constant, firm pressure.

## 2017-06-17 DEATH — deceased

## 2017-06-19 ENCOUNTER — Telehealth: Payer: Self-pay | Admitting: Adult Health

## 2017-06-19 NOTE — Telephone Encounter (Signed)
Collier FlowersMarti W Langholz called to inform us the pt has passes on 

## 2017-06-19 NOTE — Telephone Encounter (Signed)
Noted  

## 2017-07-06 IMAGING — CT CT HEAD W/O CM
3 series · 15 of 47 positions shown, 18 images · non-contrast
Comparison: CT scan of June 13, 2013.

CLINICAL DATA: Head injury after fall 2 weeks ago.

EXAM:
CT HEAD WITHOUT CONTRAST
TECHNIQUE: Contiguous axial images were obtained from the base of the skull
through the vertex without intravenous contrast.

[Series 2: head w/(date) · axial · 0.44mm/px · z∈[+964,+1094]mm · 9 of 32 slices shown, 12 images]
[im 3/32  brain]
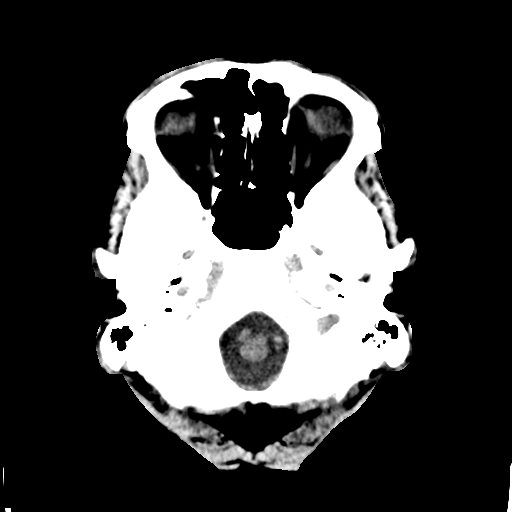
[im 3/32  bone]
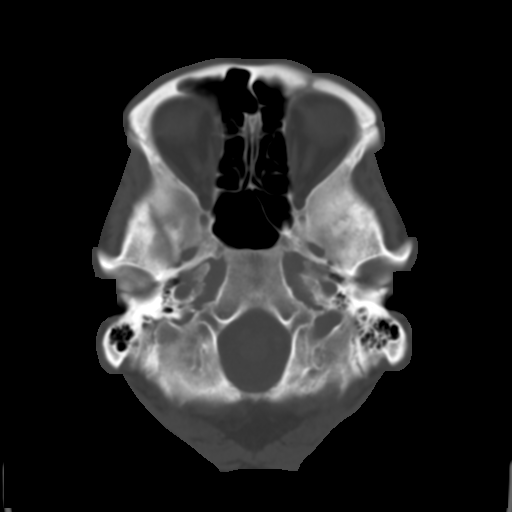
[im 6/32  brain]
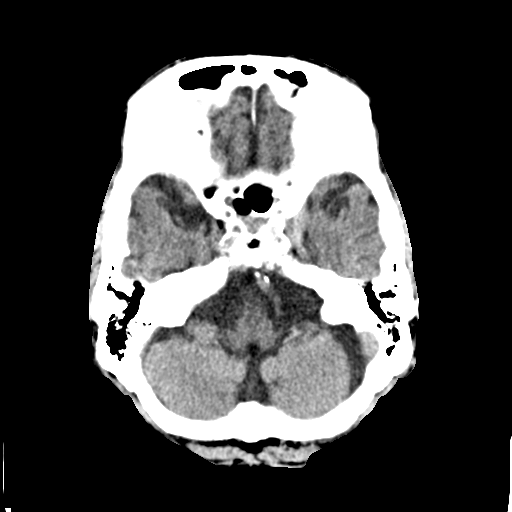
[im 9/32  brain]
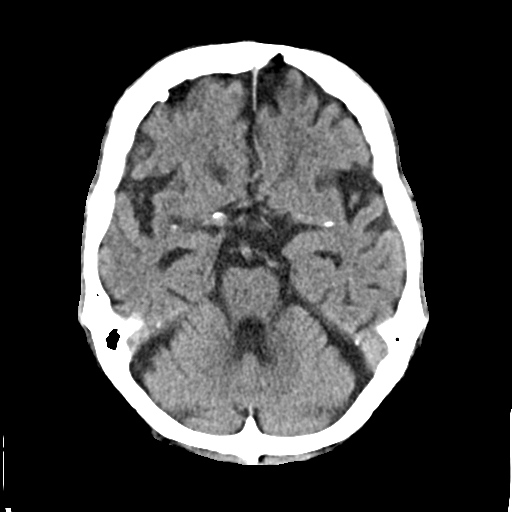
[im 12/32  brain]
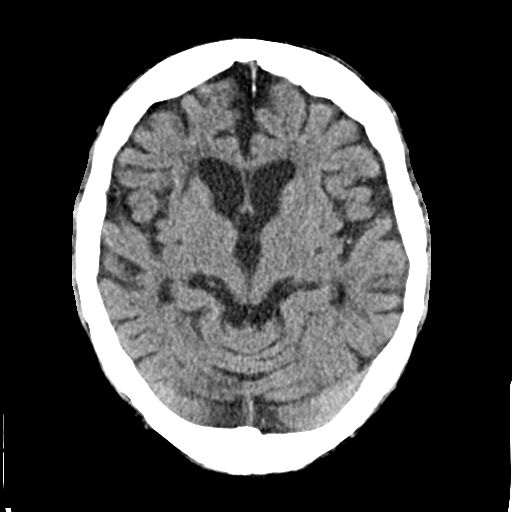
[im 17/32  brain]
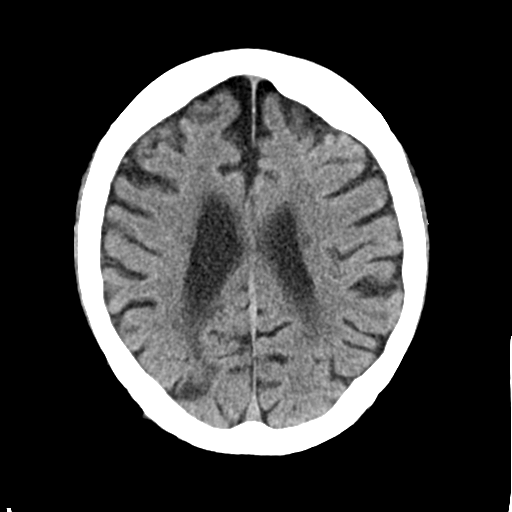
[im 17/32  bone]
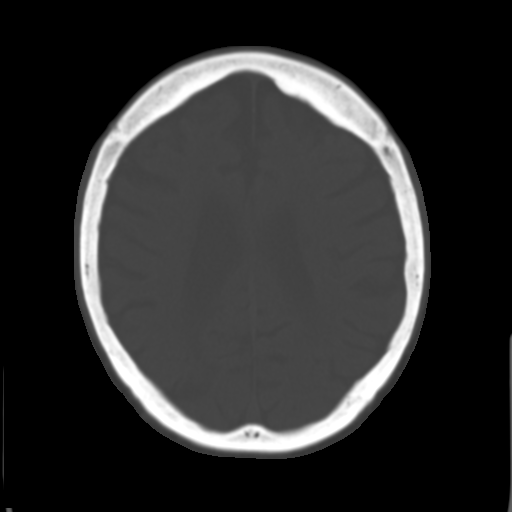
[im 20/32  brain]
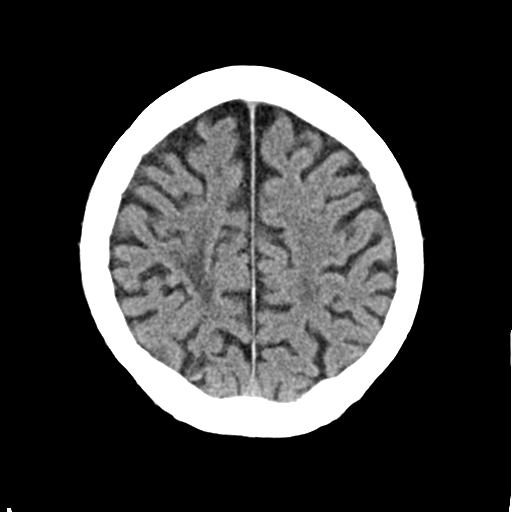
[im 23/32  brain]
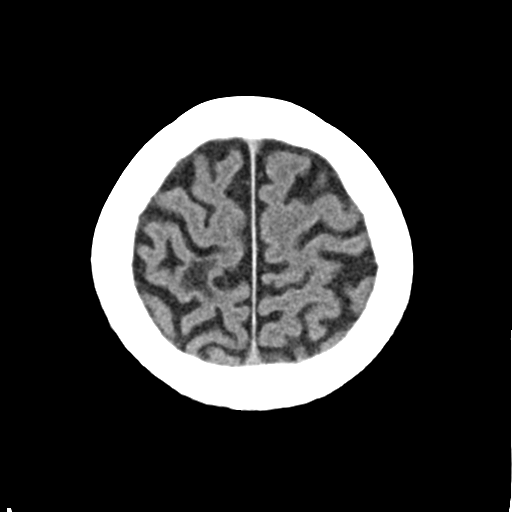
[im 26/32  brain]
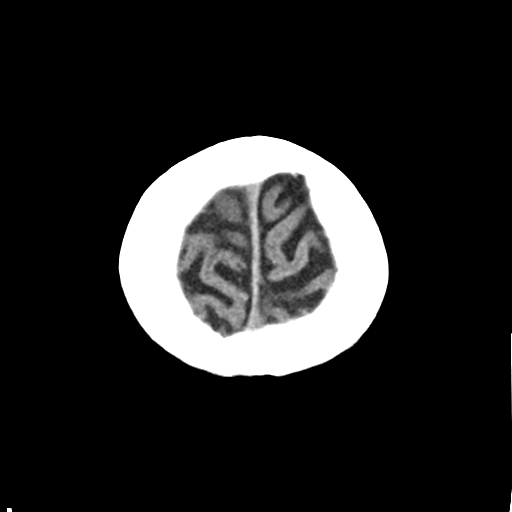
[im 29/32  brain]
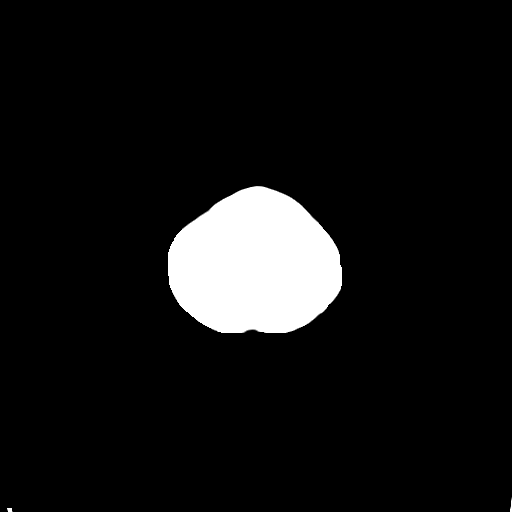
[im 29/32  bone]
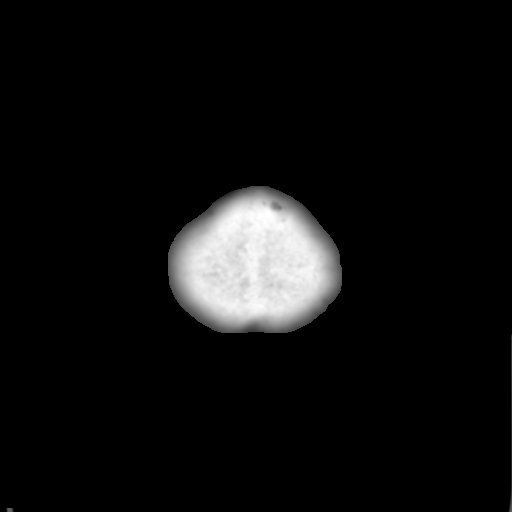

[Series 4: cor · coronal · 0.28mm/px · 3 of 62 slices shown]
[im 21/62  brain]
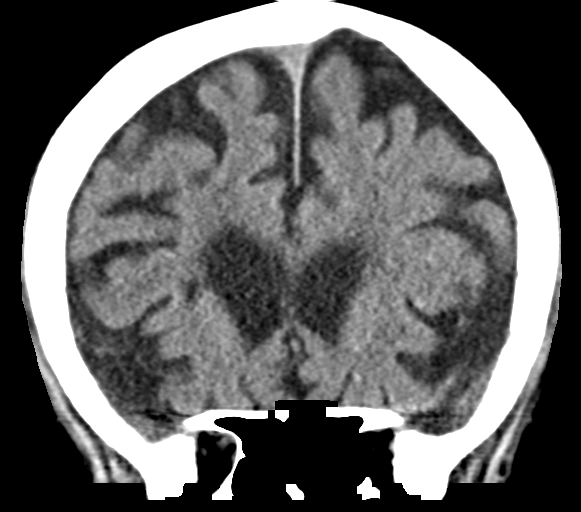
[im 28/62  brain]
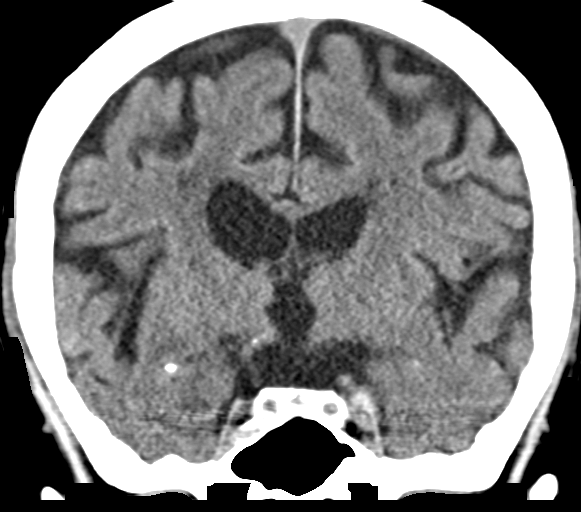
[im 34/62  brain]
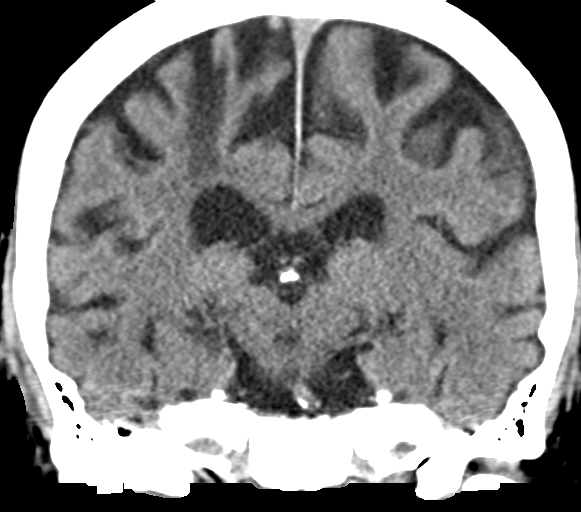

[Series 5: sag · sagittal · 0.30mm/px · 3 of 55 slices shown]
[im 19/55  brain]
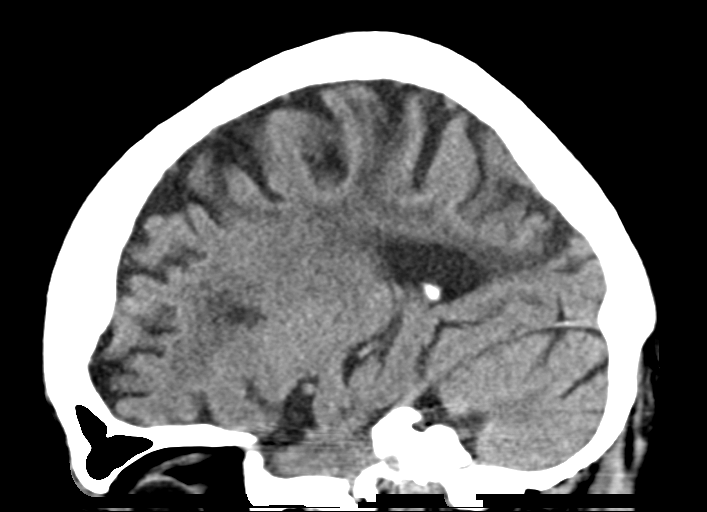
[im 28/55  brain]
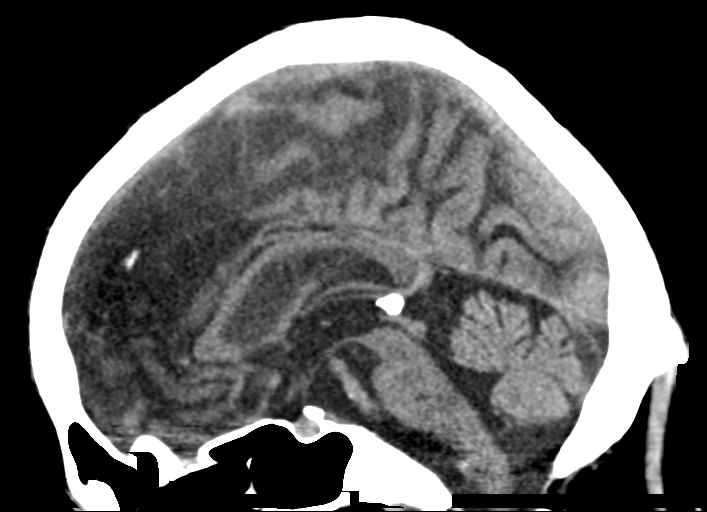
[im 37/55  brain]
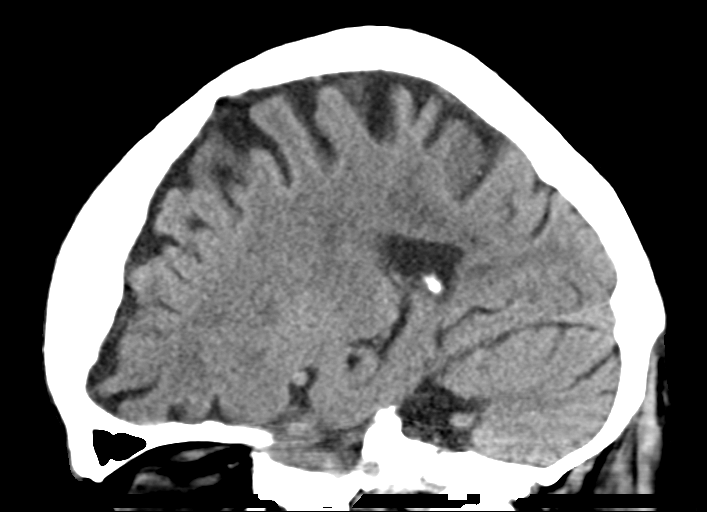

[15 of 47 positions shown; findings below may reference images not displayed]

FINDINGS: Brain: Mild diffuse cortical atrophy is noted. Mild chronic ischemic
white matter disease is noted. No mass effect or midline shift is
noted. Ventricular size is within normal limits. There is no
evidence of mass lesion, hemorrhage or acute infarction.

Vascular: No hyperdense vessel or unexpected calcification.

Skull: Normal. Negative for fracture or focal lesion.

Sinuses/Orbits: No acute finding.

Other: None.
IMPRESSION: Mild diffuse cortical atrophy. Mild chronic ischemic white matter
disease. No acute intracranial abnormality seen.

## 2017-08-05 ENCOUNTER — Ambulatory Visit: Payer: Medicare Other | Admitting: Adult Health

## 2017-09-16 IMAGING — DX DG WRIST COMPLETE 3+V*R*
4 series · 4 of 4 positions shown · non-contrast
Comparison: None.

CLINICAL DATA: Fall

EXAM:
RIGHT WRIST - COMPLETE 3+ VIEW

[x wrist pa right]
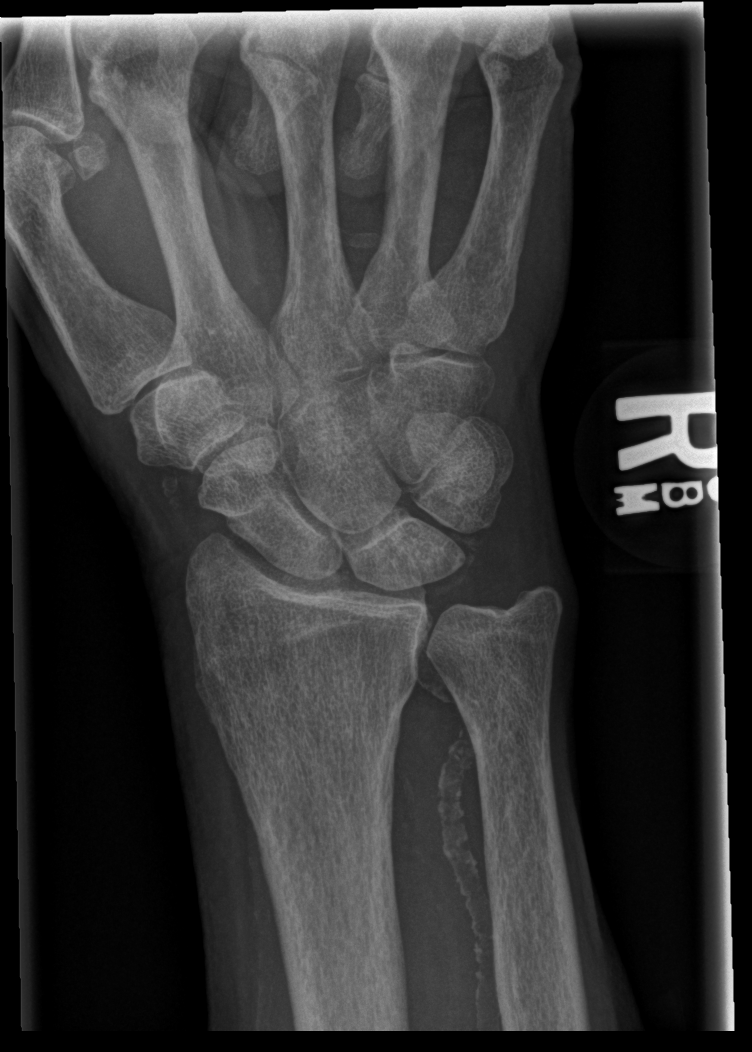

[x wrist obl right]
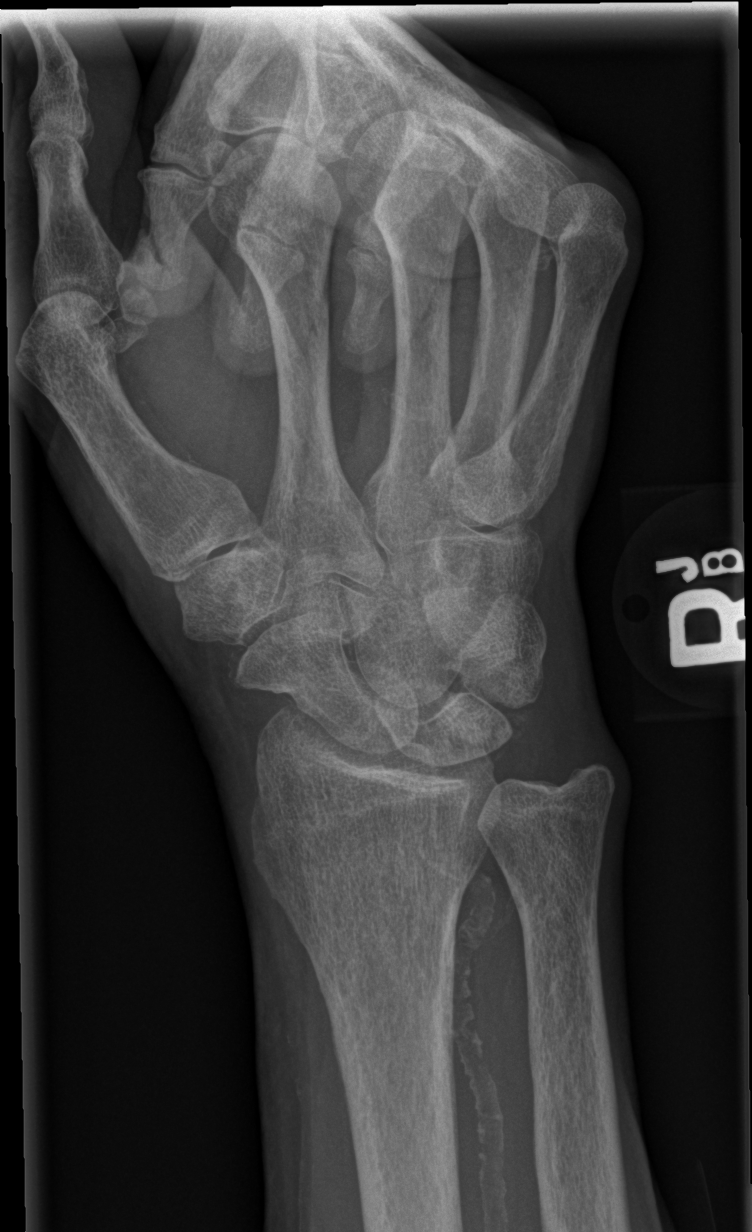

[x wrist lat right]
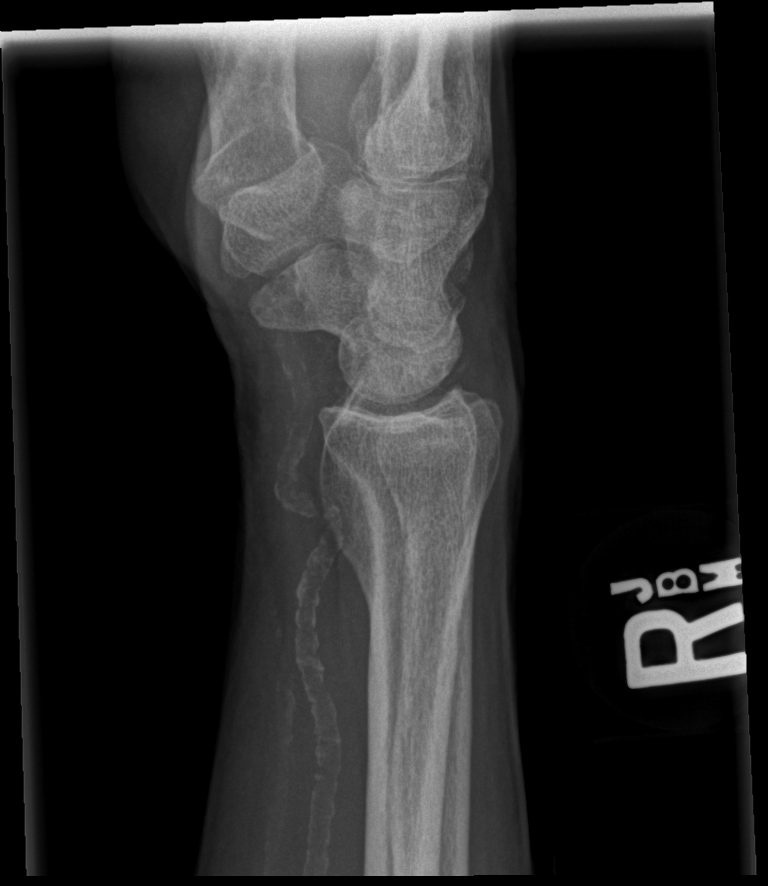

[x wrist navicular view right]
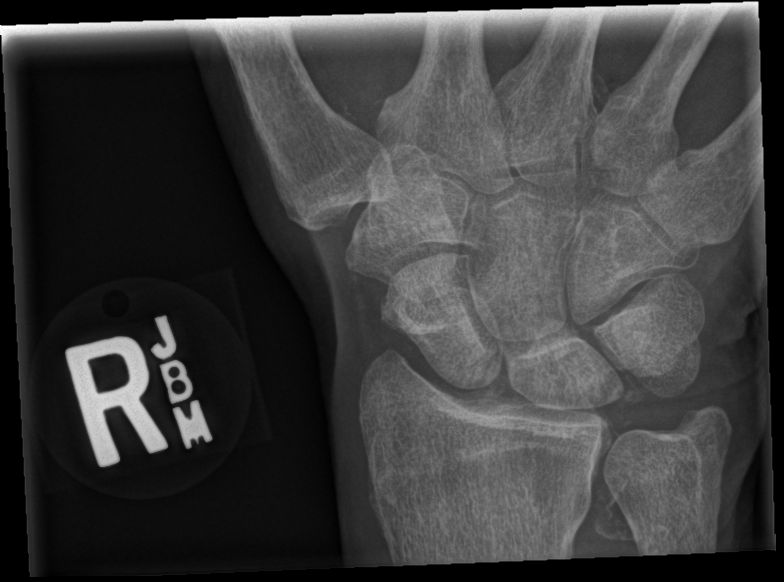

[4 of 4 positions shown; findings below may reference images not displayed]

FINDINGS: No fracture or dislocation is seen.

The joint spaces are preserved. Mild chondrocalcinosis the
radiocarpal articulation and TFCC.

The visualized soft tissues are unremarkable.

Vascular calcifications.
IMPRESSION: No fracture or dislocation is seen.
